# Patient Record
Sex: Female | Born: 1950 | Race: White | Hispanic: No | Smoking: Never smoker
Health system: Southern US, Community
[De-identification: ages and names within clinical notes are randomized; demographics above are authoritative.]

## PROBLEM LIST (undated history)

## (undated) DIAGNOSIS — C541 Malignant neoplasm of endometrium: Secondary | ICD-10-CM

## (undated) DIAGNOSIS — H269 Unspecified cataract: Secondary | ICD-10-CM

## (undated) DIAGNOSIS — M1990 Unspecified osteoarthritis, unspecified site: Secondary | ICD-10-CM

## (undated) DIAGNOSIS — T7840XA Allergy, unspecified, initial encounter: Secondary | ICD-10-CM

## (undated) DIAGNOSIS — G809 Cerebral palsy, unspecified: Secondary | ICD-10-CM

## (undated) DIAGNOSIS — E785 Hyperlipidemia, unspecified: Secondary | ICD-10-CM

## (undated) DIAGNOSIS — IMO0001 Reserved for inherently not codable concepts without codable children: Secondary | ICD-10-CM

## (undated) DIAGNOSIS — G5760 Lesion of plantar nerve, unspecified lower limb: Secondary | ICD-10-CM

## (undated) DIAGNOSIS — G479 Sleep disorder, unspecified: Secondary | ICD-10-CM

## (undated) DIAGNOSIS — M8580 Other specified disorders of bone density and structure, unspecified site: Secondary | ICD-10-CM

## (undated) DIAGNOSIS — IMO0002 Reserved for concepts with insufficient information to code with codable children: Secondary | ICD-10-CM

## (undated) DIAGNOSIS — K635 Polyp of colon: Secondary | ICD-10-CM

## (undated) DIAGNOSIS — J302 Other seasonal allergic rhinitis: Secondary | ICD-10-CM

## (undated) HISTORY — PX: TONSILLECTOMY: SUR1361

## (undated) HISTORY — PX: DILATION AND CURETTAGE OF UTERUS: SHX78

## (undated) HISTORY — DX: Reserved for inherently not codable concepts without codable children: IMO0001

## (undated) HISTORY — DX: Other specified disorders of bone density and structure, unspecified site: M85.80

## (undated) HISTORY — DX: Malignant neoplasm of endometrium: C54.1

## (undated) HISTORY — PX: OTHER SURGICAL HISTORY: SHX169

## (undated) HISTORY — DX: Polyp of colon: K63.5

## (undated) HISTORY — DX: Unspecified cataract: H26.9

## (undated) HISTORY — DX: Lesion of plantar nerve, unspecified lower limb: G57.60

## (undated) HISTORY — PX: TUBAL LIGATION: SHX77

## (undated) HISTORY — DX: Reserved for concepts with insufficient information to code with codable children: IMO0002

## (undated) HISTORY — PX: COLONOSCOPY: SHX174

## (undated) HISTORY — DX: Hyperlipidemia, unspecified: E78.5

## (undated) HISTORY — DX: Allergy, unspecified, initial encounter: T78.40XA

## (undated) HISTORY — DX: Unspecified osteoarthritis, unspecified site: M19.90

## (undated) HISTORY — DX: Other seasonal allergic rhinitis: J30.2

---

## 1999-07-12 ENCOUNTER — Encounter: Admission: RE | Admit: 1999-07-12 | Discharge: 1999-07-12 | Payer: Self-pay | Admitting: Obstetrics and Gynecology

## 1999-07-12 ENCOUNTER — Encounter: Payer: Self-pay | Admitting: Obstetrics and Gynecology

## 2000-10-02 ENCOUNTER — Encounter: Payer: Self-pay | Admitting: Obstetrics and Gynecology

## 2000-10-02 ENCOUNTER — Encounter: Admission: RE | Admit: 2000-10-02 | Discharge: 2000-10-02 | Payer: Self-pay | Admitting: Obstetrics and Gynecology

## 2002-03-04 ENCOUNTER — Encounter: Payer: Self-pay | Admitting: Obstetrics and Gynecology

## 2002-03-04 ENCOUNTER — Encounter: Admission: RE | Admit: 2002-03-04 | Discharge: 2002-03-04 | Payer: Self-pay | Admitting: Obstetrics and Gynecology

## 2002-03-25 DIAGNOSIS — K635 Polyp of colon: Secondary | ICD-10-CM

## 2002-03-25 HISTORY — DX: Polyp of colon: K63.5

## 2004-12-17 ENCOUNTER — Encounter: Admission: RE | Admit: 2004-12-17 | Discharge: 2004-12-17 | Payer: Self-pay | Admitting: Obstetrics and Gynecology

## 2005-08-15 ENCOUNTER — Encounter: Admission: RE | Admit: 2005-08-15 | Discharge: 2005-08-15 | Payer: Self-pay | Admitting: Family Medicine

## 2006-02-11 ENCOUNTER — Encounter: Admission: RE | Admit: 2006-02-11 | Discharge: 2006-02-11 | Payer: Self-pay | Admitting: Family Medicine

## 2007-05-14 ENCOUNTER — Encounter: Admission: RE | Admit: 2007-05-14 | Discharge: 2007-05-14 | Payer: Self-pay | Admitting: Family Medicine

## 2007-09-17 ENCOUNTER — Encounter: Admission: RE | Admit: 2007-09-17 | Discharge: 2007-09-17 | Payer: Self-pay | Admitting: Family Medicine

## 2010-03-25 DIAGNOSIS — G5760 Lesion of plantar nerve, unspecified lower limb: Secondary | ICD-10-CM

## 2010-03-25 HISTORY — DX: Lesion of plantar nerve, unspecified lower limb: G57.60

## 2011-02-04 ENCOUNTER — Other Ambulatory Visit: Payer: Self-pay | Admitting: Obstetrics and Gynecology

## 2011-02-04 DIAGNOSIS — Z1231 Encounter for screening mammogram for malignant neoplasm of breast: Secondary | ICD-10-CM

## 2011-02-05 ENCOUNTER — Encounter: Payer: Self-pay | Admitting: *Deleted

## 2011-02-05 ENCOUNTER — Ambulatory Visit (HOSPITAL_COMMUNITY)
Admission: RE | Admit: 2011-02-05 | Discharge: 2011-02-05 | Disposition: A | Discharge status: Home / Self Care | Payer: Self-pay | Source: Ambulatory Visit | Attending: Obstetrics and Gynecology | Admitting: Obstetrics and Gynecology

## 2011-02-05 ENCOUNTER — Ambulatory Visit (INDEPENDENT_AMBULATORY_CARE_PROVIDER_SITE_OTHER): Payer: Self-pay | Admitting: *Deleted

## 2011-02-05 VITALS — BP 139/88 | HR 70 | Temp 96.9°F | Ht 59.25 in | Wt 146.5 lb

## 2011-02-05 DIAGNOSIS — Z01419 Encounter for gynecological examination (general) (routine) without abnormal findings: Secondary | ICD-10-CM

## 2011-02-05 DIAGNOSIS — Z1231 Encounter for screening mammogram for malignant neoplasm of breast: Secondary | ICD-10-CM

## 2011-02-05 NOTE — Patient Instructions (Signed)
Taught patient how to perform BSE and gave educational materials to take home.Let her know [facility] will cover Pap smears every 3 years unless has a history of abnormal Pap smears. Told patient about free Pap smear screenings if would like a Pap smear at 1-2 years. Patient escorted to mammography to get her screening mammogram. Let patient know will follow up with her within the next couple weeks with results by either letter or phone. Patient verbalized understanding.

## 2011-02-05 NOTE — Progress Notes (Signed)
No complaints today.  Pap Smear:    Completed Pap smear today. Last Pap smear was at least 4 years ago per patient. Per patient had an abnormal Pap smear around 10-15 years ago. No Pap smear results in EPIC.  Physical exam: Breasts Breasts symmetrical. No skin abnormalities bilateral breasts. No nipple retraction bilateral breasts. No nipple discharge bilateral breasts. No lymphadenopathy. No lumps palpated bilateral breasts.          Pelvic/Bimanual   Ext Genitalia No lesions, no swelling and no discharge observed on external genitalia.         Vagina Vagina pink and normal texture. No lesions or discharge observed in vagina.          Cervix Cervix is present. Cervix pink and of normal texture. Cervix friable. Small amount of mucous like discharge from cervical opening.          Uterus Uterus is present and palpable. Uterus in normal position and normal size.       Adnexae Bilateral ovaries present and palpable. No tenderness on palpation.        Rectovaginal No rectal exam completed today since patient had no rectal complaints. Small hemorrhoid observed on rectal area.

## 2011-02-25 ENCOUNTER — Encounter: Payer: Self-pay | Admitting: Obstetrics and Gynecology

## 2012-01-29 ENCOUNTER — Other Ambulatory Visit (HOSPITAL_COMMUNITY): Payer: Self-pay | Admitting: Family Medicine

## 2012-02-06 ENCOUNTER — Encounter (HOSPITAL_COMMUNITY): Payer: Self-pay | Admitting: *Deleted

## 2012-02-18 ENCOUNTER — Ambulatory Visit (HOSPITAL_COMMUNITY): Payer: Self-pay

## 2012-03-04 ENCOUNTER — Other Ambulatory Visit: Payer: Self-pay | Admitting: Obstetrics and Gynecology

## 2012-03-04 DIAGNOSIS — Z1231 Encounter for screening mammogram for malignant neoplasm of breast: Secondary | ICD-10-CM

## 2012-03-31 ENCOUNTER — Ambulatory Visit (HOSPITAL_COMMUNITY): Payer: Self-pay

## 2012-03-31 ENCOUNTER — Ambulatory Visit (HOSPITAL_COMMUNITY): Payer: Self-pay | Attending: Obstetrics and Gynecology

## 2012-07-06 ENCOUNTER — Encounter: Payer: Self-pay | Admitting: Family Medicine

## 2012-07-06 ENCOUNTER — Ambulatory Visit (INDEPENDENT_AMBULATORY_CARE_PROVIDER_SITE_OTHER): Payer: BC Managed Care – PPO | Admitting: Family Medicine

## 2012-07-06 VITALS — BP 120/70 | HR 66 | Temp 97.4°F | Ht 59.5 in | Wt 145.2 lb

## 2012-07-06 DIAGNOSIS — M159 Polyosteoarthritis, unspecified: Secondary | ICD-10-CM | POA: Insufficient documentation

## 2012-07-06 DIAGNOSIS — J309 Allergic rhinitis, unspecified: Secondary | ICD-10-CM

## 2012-07-06 DIAGNOSIS — J302 Other seasonal allergic rhinitis: Secondary | ICD-10-CM | POA: Insufficient documentation

## 2012-07-06 MED ORDER — MELOXICAM 15 MG PO TABS: 15.0000 mg | ORAL_TABLET | Freq: Every day | ORAL | Status: DC

## 2012-07-06 NOTE — Assessment & Plan Note (Signed)
Stable on meloxicam.  RFd this med today.

## 2012-07-06 NOTE — Assessment & Plan Note (Signed)
Buy OTC generic allegra 180mg and take one tab once daily. If not helping much in 1 wk then buy OTC nasal spray called Nasacort and use 2 sprays in each nostril once daily 

## 2012-07-06 NOTE — Patient Instructions (Addendum)
Buy OTC generic allegra 180mg  and take one tab once daily. If not helping much in 1 wk then buy OTC nasal spray called Nasacort and use 2 sprays in each nostril once daily

## 2012-07-06 NOTE — Progress Notes (Signed)
Office Note 07/11/2012  CC:  Chief Complaint  Patient presents with  . Establish Care    HPI:  Kelsey Bruce is a 62 y.o. White female who is here to establish care. Patient's most recent primary MD: Dewain Penning in [place]. Old records were not reviewed prior to or during today's visit.  All rhin sx's: sneezing, runny nose, eyes water, feels PND and occ cough assoc with this.  Has tried claritin and zyrtec w/out much help. Regarding her arthritis, she says that things are relatively well controlled on daily meloxicam.  Past Medical History  Diagnosis Date  . Arthritis     LB, HIPs, knees, hands  . Seasonal allergic rhinitis   . Morton's neuroma 2012    Dr. Olevia Perches did injections in the past  . BPPV (benign paroxysmal positional vertigo)     Past Surgical History  Procedure Laterality Date  . Dilation and curettage of uterus    . Tonsillectomy      Family History  Problem Relation Age of Onset  . Heart disease Father   . Hypertension Father   . Cancer Father     lung  . Arthritis Father   . Alcohol abuse Father   . Heart disease Mother   . Cancer Mother     lung  . Deep vein thrombosis Mother   . Alcohol abuse Mother   . Heart disease Brother   . Hypertension Brother   . Heart disease Brother   . Hypertension Brother   . Heart disease Brother   . Hypertension Brother     History   Social History  . Marital Status: Divorced    Spouse Name: N/A    Number of Children: N/A  . Years of Education: N/A   Occupational History  . Not on file.   Social History Main Topics  . Smoking status: Never Smoker   . Smokeless tobacco: Never Used  . Alcohol Use: Yes  . Drug Use: No  . Sexually Active: No   Other Topics Concern  . Not on file   Social History Narrative   Divorced.  Two children.   Orig from [place], [place].   HS education.   Works at [employer] in [place].   No tobacco, occ alcohol, no drugs.   Has 3 brothers and they all smoked, as  did her parents.    Outpatient Encounter Prescriptions as of 07/06/2012  Medication Sig Dispense Refill  . gabapentin (NEURONTIN) 300 MG capsule Take 300 mg by mouth as needed.      . meloxicam (MOBIC) 15 MG tablet Take 1 tablet (15 mg total) by mouth daily.  30 tablet  6  . [DISCONTINUED] meloxicam (MOBIC) 15 MG tablet Take 15 mg by mouth daily.       No facility-administered encounter medications on file as of 07/06/2012.    No Known Allergies  ROS Review of Systems  Constitutional: Negative for fever and fatigue.  HENT: Negative for congestion and sore throat.   Eyes: Negative for visual disturbance.  Respiratory: Negative for cough.   Cardiovascular: Negative for chest pain.  Gastrointestinal: Negative for nausea and abdominal pain.  Genitourinary: Negative for dysuria.  Musculoskeletal: Negative for back pain and joint swelling.  Skin: Negative for rash.  Neurological: Negative for weakness and headaches.  Hematological: Negative for adenopathy.  Psychiatric/Behavioral: Negative for dysphoric mood.    PE; Blood pressure 120/70, pulse 66, temperature 97.4 F (36.3 C), temperature source Oral, height 4' 11.5" (1.511 m),  weight 145 lb 4 oz (65.885 kg), SpO2 95.00%. Gen: Alert, well appearing.  Patient is oriented to person, place, time, and situation. ENT: Ears: EACs clear, normal epithelium.  TMs with good light reflex and landmarks bilaterally.  Eyes: no injection, icteris, swelling, or exudate.  EOMI, PERRLA. Nose: no drainage or turbinate edema/swelling.  No injection or focal lesion.  Mouth: lips without lesion/swelling.  Oral mucosa pink and moist.  Dentition intact and without obvious caries or gingival swelling.  Oropharynx without erythema, exudate, or swelling.  Neck - No masses or thyromegaly or limitation in range of motion CV: RRR, no m/r/g.   LUNGS: CTA bilat, nonlabored resps, good aeration in all lung fields.  Pertinent labs:  None today  ASSESSMENT AND  PLAN:   New Pt; pt says no old records to obtain.  Osteoarthritis of multiple joints Stable on meloxicam.  RFd this med today.  Seasonal allergic rhinitis Buy OTC generic allegra 180mg  and take one tab once daily. If not helping much in 1 wk then buy OTC nasal spray called Nasacort and use 2 sprays in each nostril once daily    An After Visit Summary was printed and given to the patient.  Return for f/u at pt's convenience for fasting CPE with blood work. Need to clarify GYN screening/mammo status at that time.

## 2012-07-29 ENCOUNTER — Other Ambulatory Visit (INDEPENDENT_AMBULATORY_CARE_PROVIDER_SITE_OTHER): Payer: BC Managed Care – PPO

## 2012-07-29 DIAGNOSIS — Z0000 Encounter for general adult medical examination without abnormal findings: Secondary | ICD-10-CM

## 2012-07-29 LAB — HEPATIC FUNCTION PANEL
ALT: 19 U/L (ref 0–35)
AST: 19 U/L (ref 0–37)
Total Protein: 6.9 g/dL (ref 6.0–8.3)

## 2012-07-29 LAB — CBC
HCT: 41.7 % (ref 36.0–46.0)
MCV: 89.3 fl (ref 78.0–100.0)
Platelets: 254 10*3/uL (ref 150.0–400.0)
RBC: 4.67 Mil/uL (ref 3.87–5.11)
WBC: 6.1 10*3/uL (ref 4.5–10.5)

## 2012-07-29 LAB — LIPID PANEL
Cholesterol: 182 mg/dL (ref 0–200)
LDL Cholesterol: 121 mg/dL — ABNORMAL HIGH (ref 0–99)
Total CHOL/HDL Ratio: 4
Triglycerides: 83 mg/dL (ref 0.0–149.0)

## 2012-07-29 LAB — RENAL FUNCTION PANEL
Creatinine, Ser: 0.8 mg/dL (ref 0.4–1.2)
Glucose, Bld: 91 mg/dL (ref 70–99)
Phosphorus: 2.9 mg/dL (ref 2.3–4.6)
Potassium: 3.9 mEq/L (ref 3.5–5.1)
Sodium: 140 mEq/L (ref 135–145)

## 2012-07-29 NOTE — Progress Notes (Signed)
Labs only

## 2012-08-05 ENCOUNTER — Encounter: Payer: Self-pay | Admitting: Family Medicine

## 2012-08-05 ENCOUNTER — Telehealth: Payer: Self-pay | Admitting: Family Medicine

## 2012-08-05 ENCOUNTER — Ambulatory Visit (INDEPENDENT_AMBULATORY_CARE_PROVIDER_SITE_OTHER): Payer: BC Managed Care – PPO | Admitting: Family Medicine

## 2012-08-05 VITALS — BP 132/82 | HR 70 | Temp 97.6°F | Resp 14 | Ht 60.0 in | Wt 143.2 lb

## 2012-08-05 DIAGNOSIS — Z0000 Encounter for general adult medical examination without abnormal findings: Secondary | ICD-10-CM | POA: Insufficient documentation

## 2012-08-05 DIAGNOSIS — Z1239 Encounter for other screening for malignant neoplasm of breast: Secondary | ICD-10-CM

## 2012-08-05 DIAGNOSIS — G809 Cerebral palsy, unspecified: Secondary | ICD-10-CM | POA: Insufficient documentation

## 2012-08-05 NOTE — Patient Instructions (Addendum)

## 2012-08-05 NOTE — Telephone Encounter (Signed)
Pls call pt and tell her that you called [facility] to clarify date of last colonoscopy and it was 2004 and she was sent recall notice in 2009.  Please encourage her to contact [facility] (Dr. Marina Goodell) to arrange colonoscopy.--thx

## 2012-08-05 NOTE — Progress Notes (Signed)
Office Note 08/05/2012  CC:  Chief Complaint  Patient presents with  . Annual Exam    HPI:  Kelsey Bruce is a 62 y.o. White female who is here annual health maintenance exam. Reviewed recent labs today: all normal. Reports some frustration with her employer [employer]), who says her pace of work is slow and they accuse her of not trying hard enough.  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  There has been no progression of sx's per pt.  She is tearful today when talking about this b/c apparently she may lose her job at any time.  She said her level of anxiety at this time is not in need of medication, though.  Past Medical History  Diagnosis Date  . Arthritis     LB, HIPs, knees, hands  . Seasonal allergic rhinitis   . Morton's neuroma 2012    Dr. Olevia Perches did injections in the past  . BPPV (benign paroxysmal positional vertigo)     Past Surgical History  Procedure Laterality Date  . Dilation and curettage of uterus    . Tonsillectomy    . Colonoscopy  approx 2007    Normal per pt    Family History  Problem Relation Age of Onset  . Heart disease Father   . Hypertension Father   . Cancer Father     lung  . Arthritis Father   . Alcohol abuse Father   . Heart disease Mother   . Cancer Mother     lung  . Deep vein thrombosis Mother   . Alcohol abuse Mother   . Heart disease Brother   . Hypertension Brother   . Heart disease Brother   . Hypertension Brother   . Heart disease Brother   . Hypertension Brother     History   Social History  . Marital Status: Divorced    Spouse Name: N/A    Number of Children: N/A  . Years of Education: N/A   Occupational History  . Not on file.   Social History Main Topics  . Smoking status: Never Smoker   . Smokeless tobacco: Never Used  . Alcohol Use: Yes  . Drug Use: No  .  Sexually Active: No   Other Topics Concern  . Not on file   Social History Narrative   Divorced.  Two children.   Orig from [place], [place].   HS education.   Works at [employer] in [place].   No tobacco, occ alcohol, no drugs.   Has 3 brothers and they all smoked, as did her parents.    Outpatient Prescriptions Prior to Visit  Medication Sig Dispense Refill  . gabapentin (NEURONTIN) 300 MG capsule Take 300 mg by mouth as needed.      . meloxicam (MOBIC) 15 MG tablet Take 1 tablet (15 mg total) by mouth daily.  30 tablet  6   No facility-administered medications prior to visit.    No Known Allergies  ROS Review of Systems  Constitutional: Negative for fever, chills, appetite change and fatigue.  HENT: Negative for ear pain, congestion, sore throat, neck stiffness and dental problem.   Eyes: Negative for discharge, redness and visual disturbance.  Respiratory: Negative for cough, chest tightness, shortness of breath and wheezing.   Cardiovascular: Negative for chest pain, palpitations and leg swelling.  Gastrointestinal: Negative for nausea, vomiting, abdominal pain, diarrhea and blood in stool.  Endocrine: Negative for cold intolerance, heat intolerance, polydipsia, polyphagia and polyuria.  Genitourinary: Negative for dysuria, urgency, frequency, hematuria, flank pain and difficulty urinating.  Musculoskeletal: Negative for myalgias, back pain, joint swelling and arthralgias.  Skin: Negative for pallor and rash.  Neurological: Negative for dizziness, tremors, seizures, syncope, speech difficulty, weakness, numbness and headaches.  Hematological: Negative for adenopathy. Does not bruise/bleed easily.  Psychiatric/Behavioral: Negative for confusion and sleep disturbance. The patient is not nervous/anxious.      PE; Blood pressure 132/82, pulse 70, temperature 97.6 F (36.4 C), temperature source Oral, resp. rate 14, height 5' (1.524 m), weight 143 lb 4 oz (64.978 kg), SpO2  98.00%. Gen: Alert, well appearing.  Patient is oriented to person, place, time, and situation. AFFECT: pleasant, lucid thought and speech. ENT: Ears: EACs clear, normal epithelium.  TMs with good light reflex and landmarks bilaterally.  Eyes: no injection, icteris, swelling, or exudate.  EOMI, PERRLA. Nose: no drainage or turbinate edema/swelling.  No injection or focal lesion.  Mouth: lips without lesion/swelling.  Oral mucosa pink and moist.  Dentition intact and without obvious caries or gingival swelling.  Oropharynx without erythema, exudate, or swelling.  Neck: supple/nontender.  No LAD, mass, or TM.  Carotid pulses 2+ bilaterally, without bruits. CV: RRR, no m/r/g.   LUNGS: CTA bilat, nonlabored resps, good aeration in all lung fields. ABD: soft, NT, ND, BS normal.  No hepatospenomegaly or mass.  No bruits. EXT: no clubbing, cyanosis, or edema.  Musculoskeletal: no joint swelling, erythema, warmth, or tenderness.  ROM of all joints intact. Skin - no sores or suspicious lesions or rashes or color changes Breasts: breasts appear normal, no suspicious masses, no skin or nipple changes or axillary nodes. Neuro: CN 2-12 intact bilaterally, strength 5/5 in proximal and distal upper extremities.  Lower extremies strength 5/5 hip on right, 5-/5 on left hip, 5-/5 bilat in quads.  Ankles/calfs 5/5 bilaterally.  No sensory deficits.  No tremor.  Very mild intention tremor with FNF bilat.  Heel-chin-ankle bilat was normal.  No ataxia.  Upper extremity and lower extremity DTRs symmetric.  Babinski --no reaction bilat.  No pronator drift.  Pertinent labs:  Lab Results  Component Value Date   TSH 1.62 07/29/2012   Lab Results  Component Value Date   WBC 6.1 07/29/2012   HGB 14.1 07/29/2012   HCT 41.7 07/29/2012   MCV 89.3 07/29/2012   PLT 254.0 07/29/2012   Lab Results  Component Value Date   CREATININE 0.8 07/29/2012   BUN 27* 07/29/2012   NA 140 07/29/2012   K 3.9 07/29/2012   CL 107 07/29/2012   CO2 28  07/29/2012   Lab Results  Component Value Date   ALT 19 07/29/2012   AST 19 07/29/2012   ALKPHOS 54 07/29/2012   BILITOT 0.8 07/29/2012   Lab Results  Component Value Date   CHOL 182 07/29/2012   Lab Results  Component Value Date   HDL 44.60 07/29/2012   Lab Results  Component Value Date   LDLCALC 121* 07/29/2012   Lab Results  Component Value Date   TRIG 83.0 07/29/2012   Lab Results  Component Value Date   CHOLHDL 4 07/29/2012   No results found for this basename: PSA    ASSESSMENT AND PLAN:   Health maintenance examination Reviewed age and gender appropriate health maintenance issues (prudent diet, regular exercise, health risks of tobacco and excessive alcohol,  use of seatbelts, fire alarms in home, use of sunscreen).  Also reviewed age and gender appropriate health screening as well as vaccine recommendations. Mammogram ordered today. Next colonoscopy 3 yrs according to date of last colonoscopy given by pt--will try to get record to verify this. Reviewed all screening labs done recently and these were normal Encouraged daily vit D and calcium supplement. Next pap/pelvic due approx 01/2014, but for convenience we'll go ahead and do this at her next annual CPE in 1 yr.  Cerebral palsy Very mild, but apparently her employer is concerned that it is slowing her pace of work down to a concerning level.  Patient disagrees, says she is no different than previous years and has never had employers voice concern. She feels very anxious about this but at this time says there is nothing I or anyone else can do to help her.  I told her to call back if a letter to her employer or a referral to a neurologist would help.   An After Visit Summary was printed and given to the patient.  FOLLOW UP:  Return in about 1 year (around 08/05/2013) for CPE with pap/pelvic.  ADDENDUM: after talking with [facility]: pt had colonoscopy and polypectomy 2004 by Dr. Marina Goodell and was sent a recall notice in 2009, which  she did not respond to.  Will notifiy pt of need for her to get f/u colonoscopy.   --PM

## 2012-08-05 NOTE — Assessment & Plan Note (Signed)
Very mild, but apparently her employer is concerned that it is slowing her pace of work down to a concerning level.  Patient disagrees, says she is no different than previous years and has never had employers voice concern. She feels very anxious about this but at this time says there is nothing I or anyone else can do to help her.  I told her to call back if a letter to her employer or a referral to a neurologist would help.

## 2012-08-05 NOTE — Assessment & Plan Note (Signed)
Reviewed age and gender appropriate health maintenance issues (prudent diet, regular exercise, health risks of tobacco and excessive alcohol, use of seatbelts, fire alarms in home, use of sunscreen).  Also reviewed age and gender appropriate health screening as well as vaccine recommendations. Mammogram ordered today. Next colonoscopy 3 yrs according to date of last colonoscopy given by pt--will try to get record to verify this. Reviewed all screening labs done recently and these were normal Encouraged daily vit D and calcium supplement. Next pap/pelvic due approx 01/2014, but for convenience we'll go ahead and do this at her next annual CPE in 1 yr.

## 2012-08-06 NOTE — Telephone Encounter (Signed)
LMOM with contact name and number for return call RE: Colonoscopy history/SLS

## 2012-08-07 NOTE — Telephone Encounter (Signed)
LMOM [2nd] with contact name and number for return call RE: colonoscopy and further provider instructions/SLS

## 2012-08-14 ENCOUNTER — Encounter: Payer: Self-pay | Admitting: *Deleted

## 2012-08-14 NOTE — Telephone Encounter (Signed)
Letter Mailed; provider informed/SLS

## 2012-09-10 ENCOUNTER — Ambulatory Visit
Admission: RE | Admit: 2012-09-10 | Discharge: 2012-09-10 | Disposition: A | Discharge status: Home / Self Care | Payer: BC Managed Care – PPO | Source: Ambulatory Visit | Attending: Family Medicine | Admitting: Family Medicine

## 2012-09-10 DIAGNOSIS — Z1239 Encounter for other screening for malignant neoplasm of breast: Secondary | ICD-10-CM

## 2012-09-17 ENCOUNTER — Telehealth: Payer: Self-pay | Admitting: *Deleted

## 2012-09-17 NOTE — Telephone Encounter (Signed)
Patient left voice mail requesting someone call with her mammogram results. Left message for patient letting her know that we will call her when results are released.

## 2012-09-22 ENCOUNTER — Telehealth: Payer: Self-pay | Admitting: Family Medicine

## 2012-09-22 NOTE — Telephone Encounter (Signed)
The disability process starts with the state social services department, so she should contact them and tell them her needs.  --thx

## 2012-09-22 NOTE — Telephone Encounter (Signed)
Pt wants to see if she can get disability.  Pt states she lost her job because she couldn't perform her work duties.  Pt states her feet and legs hurt so bad she has to use the wall to support her especially in the mornings.  Pt states that they just constantly hurt.  Patient states she does see a foot doctor.  Pt just wanted to see if she could start disability process.

## 2012-09-23 NOTE — Telephone Encounter (Signed)
Patient aware.

## 2012-10-26 ENCOUNTER — Ambulatory Visit (INDEPENDENT_AMBULATORY_CARE_PROVIDER_SITE_OTHER): Payer: BC Managed Care – PPO | Admitting: Family Medicine

## 2012-10-26 ENCOUNTER — Ambulatory Visit (INDEPENDENT_AMBULATORY_CARE_PROVIDER_SITE_OTHER)
Admission: RE | Admit: 2012-10-26 | Discharge: 2012-10-26 | Disposition: A | Discharge status: Home / Self Care | Payer: BC Managed Care – PPO | Source: Ambulatory Visit | Attending: Family Medicine | Admitting: Family Medicine

## 2012-10-26 ENCOUNTER — Telehealth: Payer: Self-pay | Admitting: Family Medicine

## 2012-10-26 ENCOUNTER — Encounter: Payer: Self-pay | Admitting: Family Medicine

## 2012-10-26 VITALS — BP 134/81 | HR 67 | Temp 98.0°F | Resp 16 | Ht 60.0 in | Wt 145.0 lb

## 2012-10-26 DIAGNOSIS — M25572 Pain in left ankle and joints of left foot: Secondary | ICD-10-CM

## 2012-10-26 DIAGNOSIS — M25579 Pain in unspecified ankle and joints of unspecified foot: Secondary | ICD-10-CM | POA: Insufficient documentation

## 2012-10-26 LAB — URIC ACID: Uric Acid, Serum: 5.6 mg/dL (ref 2.4–7.0)

## 2012-10-26 NOTE — Progress Notes (Signed)
OFFICE NOTE  10/26/2012  CC:  Chief Complaint  Patient presents with  . Foot Pain    Left foot x 2 weeks     HPI: Patient is a 62 y.o. Caucasian female who is here for left foot pain.   Onset months ago, getting worse x 2 weeks.  Achy pain in left side and top of foot.  Occ feels a knot-like swelling laterally and feels like is swells behind lat malleolus.  By the end of the day her calf area hurts. Has hx of Mortons neuroma on left foot, had injection in past and hurts occ in that area.  Her podiatrist is Dr. Ralene Cork.  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    No joints have been swollen or red.  No rashes.  No fevers/malaise.  Minimal plantar foot pain--mainly lateral region.  Pertinent PMH:  Past Medical History  Diagnosis Date  . Arthritis     LB, HIPs, knees, hands  . Seasonal allergic rhinitis   . Morton's neuroma 2012    Dr. Olevia Perches did injections in the past  . BPPV (benign paroxysmal positional vertigo)    Past Surgical History  Procedure Laterality Date  . Dilation and curettage of uterus    . Tonsillectomy    . Colonoscopy  2004 (Dr. Marina Goodell)    poly; recall sent 2009 but pt apparently didn't respond.   Past family and social history reviewed and there are no changes since the patient's last office visit with me.  MEDS:  Outpatient Prescriptions Prior to Visit  Medication Sig Dispense Refill  . beta carotene w/minerals (OCUVITE) tablet Take 1 tablet by mouth daily.      Marland Kitchen gabapentin (NEURONTIN) 300 MG capsule Take 300 mg by mouth as needed.      . meloxicam (MOBIC) 15 MG tablet Take 1 tablet (15 mg total) by mouth daily.  30 tablet  6  . Multiple Vitamin (MULTIVITAMIN) tablet Take 1 tablet by mouth daily.       No facility-administered medications prior to visit.    PE: Blood pressure 134/81, pulse 67, temperature 98 F (36.7 C), temperature source Temporal, resp. rate 16, height 5' (1.524 m), weight 145 lb (65.772 kg), SpO2 95.00%. Gen:  Alert, well appearing.  Patient is oriented to person, place, time, and situation. Calves and ankles symmetric: no swelling, no warmth, no erythema.   Left ankle with full ROM, normal sensation and temperature, with mild tenderness diffusely in area posterior to lat mall. Dorsolateral surface of left foot with mild warmth compared to remainder of foot but no erythema and only a mild amount of discomfort with palpation.  Mild TTP in web space between 2nd and 3rd toes, no mass palpable. Pes planus bilat.  IMPRESSION AND PLAN:  Arthralgia of foot Suspect osteoarthritis, possibly musculoskeletal overuse-type pain from pes planus and inadequate supportive footwear. Doubt gouty arthritis. Will check x-ray here today and uric acid level. 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 She'll continue daily meloxicam 15mg  --try to take more consistently.    An After Visit Summary was printed and given to the patient.  FOLLOW UP: prn

## 2012-10-26 NOTE — Telephone Encounter (Signed)
Patient Information:  Caller Name: Kelsey Bruce  Phone: [phone]  Patient: Kelsey Bruce, Kelsey Bruce  Gender: Female  DOB: 1950/11/05  Age: 62 Years  PCP: Earley Favor [facility])  Office Follow Up:  Does the office need to follow up with this patient?: No  Instructions For The Office: N/A  RN Note:  Spoke with scheduling and she can be seen in office. Appointment scheduled for 0900-10/26/12  Symptoms  Reason For Call & Symptoms: Calling about soreness and swelling on outer side and front of L foot. Hx of Nuroma and steroid shots in foot. Foot and ankle are red,  swollen and feels warm at the end of the day, and calf is achy. Swelling goes down over night. Hurts worse with walking. Afebrile.  Reviewed Health History In EMR: Yes  Reviewed Medications In EMR: Yes  Reviewed Allergies In EMR: Yes  Reviewed Surgeries / Procedures: Yes  Date of Onset of Symptoms: 10/13/2012  Treatments Tried: Heating Pad and elevation, Alleve  Treatments Tried Worked: No  Guideline(s) Used:  Leg Swelling and Edema  Disposition Per Guideline:   Go to ED Now (or to Office with PCP Approval)  Reason For Disposition Reached:   Thigh or calf pain and only 1 side and present > 1 hour  Advice Given:  Expected Course:  If your leg swelling does not get better during the next week or if it recurs, make an appointment with your doctor.  Call Back If:  Swelling becomes worse  Swelling becomes red or painful to the touch  Calf pain occurs and becomes constant  You become worse.  Patient Will Follow Care Advice:  YES  Appointment Scheduled:  10/26/2012 09:00:00 Appointment Scheduled Provider:  Earley Favor [facility])

## 2012-10-26 NOTE — Assessment & Plan Note (Addendum)
Suspect osteoarthritis, possibly musculoskeletal overuse-type pain from pes planus and inadequate supportive footwear. Doubt gouty arthritis. Will check x-ray here today and uric acid level. 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 She'll continue daily meloxicam 15mg  --try to take more consistently.

## 2012-10-27 ENCOUNTER — Other Ambulatory Visit: Payer: Self-pay | Admitting: Family Medicine

## 2012-10-27 MED ORDER — GABAPENTIN 300 MG PO CAPS: 300.0000 mg | ORAL_CAPSULE | ORAL | Status: DC | PRN

## 2012-10-27 MED ORDER — HYDROCODONE-ACETAMINOPHEN 5-325 MG PO TABS: ORAL_TABLET | ORAL | Status: DC

## 2013-01-28 ENCOUNTER — Other Ambulatory Visit: Payer: Self-pay

## 2013-04-21 ENCOUNTER — Telehealth: Payer: Self-pay | Admitting: Family Medicine

## 2013-04-21 ENCOUNTER — Encounter: Payer: Self-pay | Admitting: Family Medicine

## 2013-04-21 ENCOUNTER — Ambulatory Visit (INDEPENDENT_AMBULATORY_CARE_PROVIDER_SITE_OTHER): Payer: BC Managed Care – PPO | Admitting: Family Medicine

## 2013-04-21 VITALS — BP 144/84 | HR 71 | Temp 98.6°F | Resp 18 | Ht 60.0 in | Wt 149.0 lb

## 2013-04-21 DIAGNOSIS — M67919 Unspecified disorder of synovium and tendon, unspecified shoulder: Secondary | ICD-10-CM

## 2013-04-21 DIAGNOSIS — J209 Acute bronchitis, unspecified: Secondary | ICD-10-CM

## 2013-04-21 DIAGNOSIS — M719 Bursopathy, unspecified: Secondary | ICD-10-CM

## 2013-04-21 DIAGNOSIS — J069 Acute upper respiratory infection, unspecified: Secondary | ICD-10-CM

## 2013-04-21 DIAGNOSIS — M7580 Other shoulder lesions, unspecified shoulder: Secondary | ICD-10-CM

## 2013-04-21 HISTORY — DX: Other shoulder lesions, unspecified shoulder: M75.80

## 2013-04-21 MED ORDER — METHYLPREDNISOLONE ACETATE 40 MG/ML IJ SUSP
40.0000 mg | Freq: Once | INTRAMUSCULAR | Status: AC
  Administered 2013-04-21: 40 mg via INTRA_ARTICULAR

## 2013-04-21 NOTE — Progress Notes (Signed)
Pre visit review using our clinic review tool, if applicable. No additional management support is needed unless otherwise documented below in the visit note. 

## 2013-04-21 NOTE — Telephone Encounter (Signed)
Patient Information:  Caller Name: Ryen  Phone: [phone]  Patient: Kelsey Bruce, Kelsey Bruce  Gender: Female  DOB: 14-Mar-1951  Age: 63 Years  PCP: Ricardo Jericho [facility])  Office Follow Up:  Does the office need to follow up with this patient?: No  Instructions For The Office: N/A   Symptoms  Reason For Call & Symptoms: Calling about L shoulder pain for past 4-6 mths- hurts to lift arm and reach in certain ways. Taking Meloxican 15 mgs daily and not helping.  Started with dry cough a few weeks ago and worse last night, no other sx. Takes OTC allergy med and Flutticanizole nose spray and helps some. Last night cough woke her up several times.  Reviewed Health History In EMR: Yes  Reviewed Medications In EMR: Yes  Reviewed Allergies In EMR: Yes  Reviewed Surgeries / Procedures: Yes  Date of Onset of Symptoms: 04/20/2013  Treatments Tried: Advil 2 tabs PO prn or  Alleve 1 tabs PO prn, cough drops  Treatments Tried Worked: No  Guideline(s) Used:  Cough  Disposition Per Guideline:   See Within 3 Days in Office  Reason For Disposition Reached:   Cough has been present for > 10 days  Advice Given:  Reassurance  Coughing is the way that our lungs remove irritants and mucus. It helps protect our lungs from getting pneumonia.  You can get a dry hacking cough after a chest cold. Sometimes this type of cough can last 1-3 weeks, and be worse at night.  You can also get a cough after being exposed to irritating substances like smoke, strong perfumes, and dust.  Cough Medicines:  Home Remedy - Honey: This old home remedy has been shown to help decrease coughing at night. The adult dosage is 2 teaspoons (10 ml) at bedtime. Honey should not be given to infants under one year of age.  Coughing Spasms:  Drink warm fluids. Inhale warm mist (Reason: both relax the airway and loosen up the phlegm).  Suck on cough drops or hard candy to coat the irritated throat.  Expected Course:   The  expected course depends on what is causing the cough.  Viral bronchitis (chest cold) causes a cough that lasts 1 to 3 weeks. Sometimes you may cough up lots of phlegm (sputum, mucus). The mucus can normally be white, gray, yellow, or green.  Call Back If:  Difficulty breathing  Cough lasts more than 3 weeks  Fever lasts > 3 days  You become worse.  Patient Will Follow Care Advice:  YES  Appointment Scheduled:  04/21/2013 11:15:00 Appointment Scheduled Provider:  Ricardo Jericho [facility])

## 2013-04-21 NOTE — Patient Instructions (Signed)
Generic OTC robitussin DM      OR Mucinex DM (OTC)

## 2013-04-21 NOTE — Telephone Encounter (Signed)
Just an FYI.   She has been put on our schedule today.

## 2013-04-21 NOTE — Assessment & Plan Note (Signed)
She has failed conservative/initial therapy. Discussed giving a steroid injection and she was agreeable to this.   Procedure: Therapeutic shoulder injection (left).  The patient's clinical condition is marked by substantial pain and/or significant functional disability.  Other conservative therapy has not provided relief, is contraindicated, or not appropriate.  There is a reasonable likelihood that injection will significantly improve the patient's pain and/or functional disability. Cleaned skin with alcohol swab, used posterolateral approach, Injected 1 ml of 40mg /ml depo-medrol + 2 ml of 1% lidocaine into subacromial space without resistance.  No immediate complications.  Patient tolerated procedure well.  Post-injection care discussed, including 20 min of icing 1-2 times in the next 4-8 hours, frequent non weight-bearing ROM exercises over the next few days, and general pain medication management.

## 2013-04-21 NOTE — Assessment & Plan Note (Signed)
Fairly prolonged but I am seeing no evidence for bacterial infection.   She likely has mild acute bronchitis as well (but no sign of RAD). Watchful waiting approach, with ongoing symptom management with robitussin DM. Signs/symptoms to call or return for were reviewed and pt expressed understanding.

## 2013-04-21 NOTE — Progress Notes (Signed)
OFFICE NOTE  04/21/2013  CC:  Chief Complaint  Patient presents with  . Cough    dry cough x off and on for a couples weeks  . Arthritis    would like mobic to bve increased     HPI: Patient is a 63 y.o. Caucasian female who is here for cough and also questions about her arthritis.  Left shoulder pain x 5-6 mo, worse with abduction/reaching.  Intermittent, mainly just with movements but occ throbbing w/out movement --when in bed at night.  No weakness or paresthesias in arm.  Some occ extension of the pain up into left side of neck and left upper trap/scap region.  No injury or overuse prior to onset of the pain. No hx of problems with this shoulder.  Onset of cough, scratchy throat, some nasal congestion/runny nose---about 10 d/a.  +some fatigue.  No SOB, chest tightness or wheezing.  Some pain in chest when she coughs.  Cough is dry. No fevers or SOB. No improvement.   Pertinent PMH:  Past Medical History  Diagnosis Date  . Arthritis     LB, HIPs, knees, hands  . Seasonal allergic rhinitis   . Morton's neuroma 2012    Dr. Roseanne Reno did injections in the past  . BPPV (benign paroxysmal positional vertigo)    Past surgical, social, and family history reviewed and no changes noted since last office visit.  MEDS:  Outpatient Prescriptions Prior to Visit  Medication Sig Dispense Refill  . beta carotene w/minerals (OCUVITE) tablet Take 1 tablet by mouth daily.      Marland Kitchen gabapentin (NEURONTIN) 300 MG capsule Take 1 capsule (300 mg total) by mouth as needed.  30 capsule  1  . HYDROcodone-acetaminophen (NORCO/VICODIN) 5-325 MG per tablet 1-2 tabs po q6h prn pain  30 tablet  0  . meloxicam (MOBIC) 15 MG tablet Take 1 tablet (15 mg total) by mouth daily.  30 tablet  6  . Multiple Vitamin (MULTIVITAMIN) tablet Take 1 tablet by mouth daily.       No facility-administered medications prior to visit.    PE: Blood pressure 144/84, pulse 71, temperature 98.6 F (37 C), temperature source  Temporal, resp. rate 18, height 5' (1.524 m), weight 149 lb (67.586 kg), SpO2 97.00%. VS: noted--normal. Gen: alert, NAD, NONTOXIC APPEARING. HEENT: eyes without injection, drainage, or swelling.  Ears: EACs clear, TMs with normal light reflex and landmarks.  Nose: Clear rhinorrhea, with some dried, crusty exudate adherent to mildly injected mucosa.  No purulent d/c.  No paranasal sinus TTP.  No facial swelling.  Throat and mouth without focal lesion.  No pharyngial swelling, erythema, or exudate.   Neck: supple, no LAD.   LUNGS: CTA bilat, nonlabored resps.   CV: RRR, no m/r/g. EXT: no c/c/e SKIN: no rash Left shoulder without significant tenderness to palpation. No deformity.  Pain at 100 deg abduction, pain with resisted abduction and resisted IR.  O'brien's test neg. Neg drop sign.  Arm strength 5/5 prox and dist. Neck with full ROM without tenderness.  Upper back without tenderness.  IMPRESSION AND PLAN:  URI (upper respiratory infection) Fairly prolonged but I am seeing no evidence for bacterial infection.   She likely has mild acute bronchitis as well (but no sign of RAD). Watchful waiting approach, with ongoing symptom management with robitussin DM. Signs/symptoms to call or return for were reviewed and pt expressed understanding.   Rotator cuff tendonitis She has failed conservative/initial therapy. Discussed giving a steroid injection and  she was agreeable to this.   Procedure: Therapeutic shoulder injection (left).  The patient's clinical condition is marked by substantial pain and/or significant functional disability.  Other conservative therapy has not provided relief, is contraindicated, or not appropriate.  There is a reasonable likelihood that injection will significantly improve the patient's pain and/or functional disability. Cleaned skin with alcohol swab, used posterolateral approach, Injected 1 ml of 40mg /ml depo-medrol + 2 ml of 1% lidocaine into subacromial space  without resistance.  No immediate complications.  Patient tolerated procedure well.  Post-injection care discussed, including 20 min of icing 1-2 times in the next 4-8 hours, frequent non weight-bearing ROM exercises over the next few days, and general pain medication management.    An After Visit Summary was printed and given to the patient.  FOLLOW UP: prn

## 2013-07-06 ENCOUNTER — Telehealth: Payer: Self-pay | Admitting: Family Medicine

## 2013-07-06 DIAGNOSIS — Z124 Encounter for screening for malignant neoplasm of cervix: Secondary | ICD-10-CM

## 2013-07-06 NOTE — Telephone Encounter (Signed)
Patient requesting referral for OBGYN.   Please advise.

## 2013-07-07 NOTE — Telephone Encounter (Signed)
GYN referral entered as per pt request.

## 2013-08-05 ENCOUNTER — Encounter: Payer: BC Managed Care – PPO | Admitting: Obstetrics & Gynecology

## 2013-08-10 ENCOUNTER — Other Ambulatory Visit: Payer: Self-pay | Admitting: Family Medicine

## 2013-08-11 ENCOUNTER — Encounter: Payer: Self-pay | Admitting: Obstetrics & Gynecology

## 2013-08-11 ENCOUNTER — Ambulatory Visit (INDEPENDENT_AMBULATORY_CARE_PROVIDER_SITE_OTHER): Payer: BC Managed Care – PPO | Admitting: Obstetrics & Gynecology

## 2013-08-11 VITALS — BP 141/86 | HR 88 | Temp 97.0°F | Resp 20 | Ht 60.0 in | Wt 149.7 lb

## 2013-08-11 DIAGNOSIS — N950 Postmenopausal bleeding: Secondary | ICD-10-CM

## 2013-08-11 MED ORDER — MISOPROSTOL 200 MCG PO TABS
ORAL_TABLET | ORAL | Status: DC
Start: 2013-08-11 — End: 2013-09-17

## 2013-08-11 NOTE — Progress Notes (Signed)
   Subjective:    Patient ID: Kelsey Bruce, female    DOB: 1950-04-06, 63 y.o.   MRN: [id number]  HPI  See nurse  Review of Systems     Objective:   Physical Exam        Assessment & Plan:  cytotec the night before her [facility]

## 2013-08-11 NOTE — Progress Notes (Signed)
Pt states she was referred for routine Gyn exam w/Pap.  She has been having vaginal bleeding (spotting) x3 months. She had not discussed this problem with her PCP.  I consulted with Dr. Hulan Fray and she recommended pt have Pelvic and Transvag US soon and then return to clinic for Annual exam w/pap and Endo Bx.  This plan of care was discussed with pt and she agreed.  [place] scheduled on 6/2. Pt will return to clinic as scheduled.

## 2013-08-11 NOTE — Telephone Encounter (Signed)
Refill request for maloxicam Last filled by MD on - 07/06/2012 #30 x6 Last Appt: 04/21/2013 Next Appt: none Please advise refill?

## 2013-08-23 ENCOUNTER — Telehealth: Payer: Self-pay | Admitting: *Deleted

## 2013-08-23 NOTE — Telephone Encounter (Signed)
Kelsey Bruce left a message stating she saw Dr. Hulan Fray the other week and she has a question for the nurse.  States she has an ultrasound tomorrow 08/24/13 and was told to take some medicine before her biospsy - not sure is she should take it tomorrow or when she sees the doctor again on 09/06/13.  Per chart review plan is endometrial biopsy 09/06/13 and pelvic ultrasound 08/24/13. Called Kelsey Bruce and informed her she should take the cytotec night before her 09/06/13 appt for endometrial biopsy. Answered all her questions.

## 2013-08-24 ENCOUNTER — Ambulatory Visit (HOSPITAL_COMMUNITY)
Admission: RE | Admit: 2013-08-24 | Discharge: 2013-08-24 | Disposition: A | Discharge status: Home / Self Care | Payer: BC Managed Care – PPO | Source: Ambulatory Visit | Attending: Obstetrics & Gynecology | Admitting: Obstetrics & Gynecology

## 2013-08-24 DIAGNOSIS — N950 Postmenopausal bleeding: Secondary | ICD-10-CM | POA: Insufficient documentation

## 2013-09-06 ENCOUNTER — Ambulatory Visit (INDEPENDENT_AMBULATORY_CARE_PROVIDER_SITE_OTHER): Payer: BC Managed Care – PPO | Admitting: Obstetrics & Gynecology

## 2013-09-06 ENCOUNTER — Other Ambulatory Visit (HOSPITAL_COMMUNITY)
Admission: RE | Admit: 2013-09-06 | Discharge: 2013-09-06 | Disposition: A | Discharge status: Home / Self Care | Payer: BC Managed Care – PPO | Source: Ambulatory Visit | Attending: Obstetrics & Gynecology | Admitting: Obstetrics & Gynecology

## 2013-09-06 ENCOUNTER — Encounter: Payer: Self-pay | Admitting: Obstetrics & Gynecology

## 2013-09-06 VITALS — BP 134/83 | HR 84 | Temp 96.8°F | Ht 60.0 in | Wt 149.9 lb

## 2013-09-06 DIAGNOSIS — Z0000 Encounter for general adult medical examination without abnormal findings: Secondary | ICD-10-CM

## 2013-09-06 DIAGNOSIS — C541 Malignant neoplasm of endometrium: Secondary | ICD-10-CM | POA: Insufficient documentation

## 2013-09-06 DIAGNOSIS — C549 Malignant neoplasm of corpus uteri, unspecified: Secondary | ICD-10-CM | POA: Insufficient documentation

## 2013-09-06 DIAGNOSIS — N950 Postmenopausal bleeding: Secondary | ICD-10-CM

## 2013-09-06 NOTE — Progress Notes (Signed)
Subjective:    Kelsey Bruce is a 63 y.o. female who presents for an annual exam. She is also here for a EMBX as she has had PMB for at least 3 months. An u/s done recently showed a 2.7 cm endometrium.  The patient is not currently sexually active. GYN screening history: last pap: was normal. The patient wears seatbelts: yes. The patient participates in regular exercise: yes. Has the patient ever been transfused or tattooed?: no. The patient reports that there is not domestic violence in her life.   Menstrual History: OB History   Grav Para Term Preterm Abortions TAB SAB Ect Mult Living   2 2 2       2       No LMP recorded. Patient is postmenopausal.    The following portions of the patient's history were reviewed and updated as appropriate: allergies, current medications, past family history, past medical history, past social history, past surgical history and problem list.  Review of Systems A comprehensive review of systems was negative.    Objective:    BP 134/83  Pulse 84  Temp(Src) 96.8 F (36 C) (Oral)  Ht 5' (1.524 m)  Wt 149 lb 14.4 oz (67.994 kg)  BMI 29.28 kg/m2  General Appearance:    Alert, cooperative, no distress, appears stated age  Head:    Normocephalic, without obvious abnormality, atraumatic  Eyes:    PERRL, conjunctiva/corneas clear, EOM's intact, fundi    benign, both eyes  Ears:    Normal TM's and external ear canals, both ears  Nose:   Nares normal, septum midline, mucosa normal, no drainage    or sinus tenderness  Throat:   Lips, mucosa, and tongue normal; teeth and gums normal  Neck:   Supple, symmetrical, trachea midline, no adenopathy;    thyroid:  no enlargement/tenderness/nodules; no carotid   bruit or JVD  Back:     Symmetric, no curvature, ROM normal, no CVA tenderness  Lungs:     Clear to auscultation bilaterally, respirations unlabored  Chest Wall:    No tenderness or deformity   Heart:    Regular rate and rhythm, S1 and S2 normal, no  murmur, rub   or gallop  Breast Exam:    No tenderness, masses, or nipple abnormality  Abdomen:     Soft, non-tender, bowel sounds active all four quadrants,    no masses, no organomegaly  Genitalia:    Normal female without lesion, discharge or tenderness, NSSA, NT, normal adnexal exam   UPT negative, consent signed, time out done Cervix prepped with betadine and grasped with a single tooth tenaculum Uterus sounded to 9 cm Pipelle used for 3 passes with a large amount of tissue obtained. She tolerated the procedure well.       Extremities:   Extremities normal, atraumatic, no cyanosis or edema  Pulses:   2+ and symmetric all extremities  Skin:   Skin color, texture, turgor normal, no rashes or lesions  Lymph nodes:   Cervical, supraclavicular, and axillary nodes normal  Neurologic:   CNII-XII intact, normal strength, sensation and reflexes    throughout  .    Assessment:    Healthy female exam.  PMB   Plan:     Breast self exam technique reviewed and patient encouraged to perform self-exam monthly. Mammogram. Thin prep Pap smear.  with cotesting RTC for [facility] results

## 2013-09-09 LAB — CYTOLOGY - PAP

## 2013-09-10 ENCOUNTER — Telehealth: Payer: Self-pay

## 2013-09-10 ENCOUNTER — Ambulatory Visit (INDEPENDENT_AMBULATORY_CARE_PROVIDER_SITE_OTHER): Payer: BC Managed Care – PPO | Admitting: Obstetrics & Gynecology

## 2013-09-10 DIAGNOSIS — C541 Malignant neoplasm of endometrium: Secondary | ICD-10-CM

## 2013-09-10 DIAGNOSIS — Z01812 Encounter for preprocedural laboratory examination: Secondary | ICD-10-CM

## 2013-09-10 DIAGNOSIS — C549 Malignant neoplasm of corpus uteri, unspecified: Secondary | ICD-10-CM

## 2013-09-10 LAB — BUN: BUN: 17 mg/dL (ref 6–23)

## 2013-09-10 LAB — CREATININE, SERUM: CREATININE: 0.96 mg/dL (ref 0.50–1.10)

## 2013-09-10 NOTE — Progress Notes (Signed)
Scheduled CT of the chest, abdomen, and pelvis with contrast for June 24th 1330 @ [facility].  Called GYN ONC and left message x 2 to return call in reference to setting up referral appt.  Informed pt that it will not be until Monday due to our office closing at noon.  Pt stated understanding.

## 2013-09-10 NOTE — Progress Notes (Signed)
   Subjective:    Patient ID: Kelsey Bruce, female    DOB: 1950/04/28, 63 y.o.   MRN: [id number]  HPI  Kelsey Bruce is here to discuss her [facility] which showed endometrial cancer.  Review of Systems     Objective:   Physical Exam        Assessment & Plan:  I have called the gyn onc office to see if they want me to order any imaging prior to her appt with them. They have as yet not returned my call.

## 2013-09-10 NOTE — Telephone Encounter (Signed)
Called pt with appt for GYN Oncology set for June 26th @ 1145am pt needs to arrive at 1115am.  Left message on VM concerning appt and if she has any questions to please give [place] call here at the clinics.

## 2013-09-13 NOTE — Progress Notes (Signed)
Called patient's insurer [employer]-- prior auth completed for CT imaging. Authorization #[id number] -- good for 30 days.

## 2013-09-15 ENCOUNTER — Ambulatory Visit (HOSPITAL_COMMUNITY)
Admission: RE | Admit: 2013-09-15 | Discharge: 2013-09-15 | Disposition: A | Discharge status: Home / Self Care | Payer: BC Managed Care – PPO | Source: Ambulatory Visit | Attending: Obstetrics & Gynecology | Admitting: Obstetrics & Gynecology

## 2013-09-15 ENCOUNTER — Encounter (HOSPITAL_COMMUNITY): Payer: Self-pay

## 2013-09-15 DIAGNOSIS — M47814 Spondylosis without myelopathy or radiculopathy, thoracic region: Secondary | ICD-10-CM | POA: Insufficient documentation

## 2013-09-15 DIAGNOSIS — C541 Malignant neoplasm of endometrium: Secondary | ICD-10-CM

## 2013-09-15 DIAGNOSIS — R911 Solitary pulmonary nodule: Secondary | ICD-10-CM | POA: Insufficient documentation

## 2013-09-15 DIAGNOSIS — Z0000 Encounter for general adult medical examination without abnormal findings: Secondary | ICD-10-CM

## 2013-09-15 DIAGNOSIS — C549 Malignant neoplasm of corpus uteri, unspecified: Secondary | ICD-10-CM | POA: Insufficient documentation

## 2013-09-15 DIAGNOSIS — R109 Unspecified abdominal pain: Secondary | ICD-10-CM | POA: Insufficient documentation

## 2013-09-15 DIAGNOSIS — Z1231 Encounter for screening mammogram for malignant neoplasm of breast: Secondary | ICD-10-CM | POA: Insufficient documentation

## 2013-09-15 DIAGNOSIS — K7689 Other specified diseases of liver: Secondary | ICD-10-CM | POA: Insufficient documentation

## 2013-09-15 MED ORDER — IOHEXOL 300 MG/ML  SOLN
100.0000 mL | Freq: Once | INTRAMUSCULAR | Status: AC | PRN
  Administered 2013-09-15: 100 mL via INTRAVENOUS

## 2013-09-16 ENCOUNTER — Ambulatory Visit: Payer: BC Managed Care – PPO | Admitting: Obstetrics & Gynecology

## 2013-09-17 ENCOUNTER — Ambulatory Visit: Payer: BC Managed Care – PPO | Attending: Gynecologic Oncology | Admitting: Gynecologic Oncology

## 2013-09-17 ENCOUNTER — Encounter: Payer: Self-pay | Admitting: Gynecologic Oncology

## 2013-09-17 DIAGNOSIS — C549 Malignant neoplasm of corpus uteri, unspecified: Secondary | ICD-10-CM

## 2013-09-17 DIAGNOSIS — C541 Malignant neoplasm of endometrium: Secondary | ICD-10-CM

## 2013-09-17 NOTE — Patient Instructions (Signed)
You have surgery scheduled for October 05, 2013 with Dr. Denman George                  Preparing for your Surgery  Pre-operative Testing -You will receive a phone call from presurgical testing at [facility] to arrange for a pre-operative testing appointment before your surgery.  This appointment normally occurs one to two weeks before your scheduled surgery.   -Bring your insurance card, copy of an advanced directive if applicable, medication list  -At that visit, you will be asked to sign a consent for a possible blood transfusion in case a transfusion becomes necessary during surgery.  The need for a blood transfusion is rare but having consent is a necessary part of your care.     Day Before Surgery at [place] [place] will be asked to take in only clear liquids the day before surgery.  Examples of clear liquids include broths, jello, and clear juices.  You may also be advised to perform a Miralax bowel prep or fleets enema the night before your surgery based off of your provider's recommendations.  You will be advised to have nothing to eat or drink after midnight the evening before.    Your role in recovery Your role is to become active as soon as directed by your doctor, while still giving yourself time to heal.  Rest when you feel tired. You will be asked to do the following in order to speed your recovery:  - Cough and breathe deeply. This helps toclear and expand your lungs and can prevent pneumonia. You may be given a spirometer to practice deep breathing. A staff member will show you how to use the spirometer. - Do mild physical activity. Walking or moving your legs help your circulation and body functions return to normal. A staff member will help you when you try to walk and will provide you with simple exercises. Do not try to get up or walk alone the first time. - Actively manage your pain. Managing your pain lets you move in comfort. We will ask you to rate your pain on a scale of zero  to 10. It is your responsibility to tell your doctor or nurse where and how much you hurt so your pain can be treated.  Special Considerations -If you are diabetic, you may be placed on insulin after surgery to have closer control over your blood sugars to promote healing and recovery.  This does not mean that you will be discharged on insulin.  If applicable, your oral antidiabetics will be resumed when you are tolerating a solid diet.  -Your final pathology results from surgery should be available by the Friday after surgery and the results will be relayed to you when available.  Hysterectomy Information  A hysterectomy is a surgery in which your uterus is removed. This surgery may be done to treat various medical problems. After the surgery, you will no longer have menstrual periods. The surgery will also make you unable to become pregnant (sterile). The fallopian tubes and ovaries can be removed (bilateral salpingo-oophorectomy) during this surgery as well.  REASONS FOR A HYSTERECTOMY  Persistent, abnormal bleeding.  Lasting (chronic) pelvic pain or infection.  The lining of the uterus (endometrium) starts growing outside the uterus (endometriosis).  The endometrium starts growing in the muscle of the uterus (adenomyosis).  The uterus falls down into the vagina (pelvic organ prolapse).  Noncancerous growths in the uterus (uterine fibroids) that cause symptoms.  Precancerous cells.  Cervical cancer or uterine cancer. TYPES OF HYSTERECTOMIES  Supracervical hysterectomy--In this type, the top part of the uterus is removed, but not the cervix.  Total hysterectomy--The uterus and cervix are removed.  Radical hysterectomy--The uterus, the cervix, and the fibrous tissue that holds the uterus in place in the pelvis (parametrium) are removed. WAYS A HYSTERECTOMY CAN BE PERFORMED  Abdominal hysterectomy--A large surgical cut (incision) is made in the abdomen. The uterus is removed  through this incision.  Vaginal hysterectomy--An incision is made in the vagina. The uterus is removed through this incision. There are no abdominal incisions.  Conventional laparoscopic hysterectomy--Three or four small incisions are made in the abdomen. A thin, lighted tube with a camera (laparoscope) is inserted into one of the incisions. Other tools are put through the other incisions. The uterus is cut into small pieces. The small pieces are removed through the incisions, or they are removed through the vagina.  Laparoscopically assisted vaginal hysterectomy (LAVH)--Three or four small incisions are made in the abdomen. Part of the surgery is performed laparoscopically and part vaginally. The uterus is removed through the vagina.  Robot-assisted laparoscopic hysterectomy--A laparoscope and other tools are inserted into 3 or 4 small incisions in the abdomen. A computer-controlled device is used to give the surgeon a 3D image and to help control the surgical instruments. This allows for more precise movements of surgical instruments. The uterus is cut into small pieces and removed through the incisions or removed through the vagina. RISKS AND COMPLICATIONS  Possible complications associated with this procedure include:  Bleeding and risk of blood transfusion. Tell your health care provider if you do not want to receive any blood products.  Blood clots in the legs or lung.  Infection.  Injury to surrounding organs.  Problems or side effects related to anesthesia.  Conversion to an abdominal hysterectomy from one of the other techniques. WHAT TO EXPECT AFTER A HYSTERECTOMY  You will be given pain medicine.  You will need to have someone with you for the first 3-5 days after you go home.  You will need to follow up with your surgeon in 2-4 weeks after surgery to evaluate your progress.  You may have early menopause symptoms such as hot flashes, night sweats, and insomnia.  If you had  a hysterectomy for a problem that was not cancer or not a condition that could lead to cancer, then you no longer need Pap tests. However, even if you no longer need a Pap test, a regular exam is a good idea to make sure no other problems are starting. Document Released: 09/04/2000 Document Revised: 12/30/2012 Document Reviewed: 11/16/2012 [facility] Patient Information 2015 [place], [place]. This information is not intended to replace advice given to you by your health care provider. Make sure you discuss any questions you have with your health care provider.

## 2013-09-17 NOTE — Progress Notes (Signed)
. Consult Note: Gyn-Onc  Consult was requested by Dr. Hulan Fray for the evaluation of Kelsey Bruce 63 y.o. female  CC:  Chief Complaint  Patient presents with  . Uterine cancer    Assessment/Plan:  Ms. Kelsey Bruce  is a 63 y.o.  year old woman (P2) with grade 1 endometrial cancer. A detailed discussion was held with the patient and her family with regard to to her endometrial cancer diagnosis. We discussed the standard management options for uterine cancer which includes surgery followed possibly by adjuvant therapy depending on the results of surgery. The options for surgical management include a hysterectomy and removal of the tubes and ovaries possibly with removal of pelvic lymph nodes. A minimally invasive approach including a robotic hysterectomy or laparoscopic hysterectomy have benefits including shorter hospital stay, recovery time and better wound healing. The alternative approach is an open hysterectomy. The patient has been counseled about these surgical options and the risks of surgery in general including infection, bleeding, damage to surrounding structures (including bowel, bladder, ureters, nerves or vessels), and the postoperative risks of PE/ DVT, and lymphedema. I extensively reviewed the additional risks of robotic hysterectomy including possible need for conversion to open laparotomy. I discussed positioning during surgery of trendelenberg and risks of minor facial swelling and care we take in preoperative positioning. After counseling and consideration of her options, she desires to proceed with robotic hysterectomy bilateral salpingo-oophorectomy and lymph node dissection as indicated.   All of her questions were answered to her satisfaction.   She will be seen by anesthesia for preoperative clearance and discussion of postoperative pain management. She was given the opportunity to ask questions, which were answered to her satisfaction, and she is agreement with the above  mentioned plan of care.     HPI: Kelsey Bruce is a 63 year old woman who is seen in consultation at the request of Dr Hulan Fray for endometrial cancer (grade 1). She  has a  3 month history of postmenopausal bleeding. She presented to Dr Hulan Fray for this and an endometrial biopsy was performed on 6/15 which revealed grade 1 endometrial cancer.  Interval History: She continues to have very light spotting and brown discharge.  Current Meds:  Outpatient Encounter Prescriptions as of 09/17/2013  Medication Sig  . beta carotene w/minerals (OCUVITE) tablet Take 1 tablet by mouth daily.  . cetirizine (ZYRTEC) 10 MG tablet Take 10 mg by mouth daily.  . fluticasone (FLONASE) 50 MCG/ACT nasal spray Place into both nostrils daily.  Marland Kitchen gabapentin (NEURONTIN) 300 MG capsule Take 1 capsule (300 mg total) by mouth as needed.  . meloxicam (MOBIC) 15 MG tablet 1 tab po qd prn musculoskeletal pain  . Multiple Vitamin (MULTIVITAMIN) tablet Take 1 tablet by mouth daily.  . mupirocin ointment (BACTROBAN) 2 % Apply 1 application topically 3 (three) times daily.  Marland Kitchen HYDROcodone-acetaminophen (NORCO/VICODIN) 5-325 MG per tablet 1-2 tabs po q6h prn pain  . [DISCONTINUED] azithromycin (ZITHROMAX) 250 MG tablet   . [DISCONTINUED] misoprostol (CYTOTEC) 200 MCG tablet Take 3 pills by mouth the night before biopsy.    Allergy: No Known Allergies  Social Hx:   History   Social History  . Marital Status: Divorced    Spouse Name: N/A    Number of Children: N/A  . Years of Education: N/A   Occupational History  . Not on file.   Social History Main Topics  . Smoking status: Never Smoker   . Smokeless tobacco: Never Used  . Alcohol Use: Yes  Comment: occasional  . Drug Use: No  . Sexual Activity: No   Other Topics Concern  . Not on file   Social History Narrative   Divorced.  Two children.   Orig from [place], [place].   HS education.   Works at [employer] in [place].   No tobacco, occ alcohol, no drugs.   Has 3  brothers and they all smoked, as did her parents.    Past Surgical Hx:  Past Surgical History  Procedure Laterality Date  . Dilation and curettage of uterus    . Tonsillectomy    . Colonoscopy  2004 (Dr. Henrene Pastor)    poly; recall sent 2009 but pt apparently didn't respond.    Past Medical Hx:  Past Medical History  Diagnosis Date  . Arthritis     LB, HIPs, knees, hands  . Seasonal allergic rhinitis   . Morton's neuroma 2012    Dr. Roseanne Reno did injections in the past  . BPPV (benign paroxysmal positional vertigo)     Past Gynecological History:  LMP was in her 63's. She has ahistory of a tubal ligation.  No LMP recorded. Patient is postmenopausal.  Family Hx:  Family History  Problem Relation Age of Onset  . Heart disease Father   . Hypertension Father   . Cancer Father     lung  . Arthritis Father   . Alcohol abuse Father   . Heart disease Mother   . Cancer Mother     lung  . Deep vein thrombosis Mother   . Alcohol abuse Mother   . Heart disease Brother   . Hypertension Brother   . Heart disease Brother   . Hypertension Brother   . Heart disease Brother   . Hypertension Brother     Review of Systems:  Constitutional  Feels well,   ENT Normal appearing ears and nares bilaterally Skin/Breast  No rash, sores, jaundice, itching, dryness Cardiovascular  No chest pain, shortness of breath, or edema  Pulmonary  No cough or wheeze.  Gastro Intestinal  No nausea, vomitting, or diarrhoea. No bright red blood per rectum, no abdominal pain, change in bowel movement, or constipation.  Genito Urinary  No frequency, urgency, dysuria, + Postmenopausal bleeding/spotting Musculo Skeletal  No myalgia, arthralgia, joint swelling or pain  Neurologic  No weakness, numbness, change in gait,  Psychology  No depression, anxiety, insomnia.   Vitals:  Blood pressure 165/90, pulse 68, temperature 97.6 F (36.4 C), temperature source Oral, resp. rate 20, height 5' 0.25" (1.53 m),  weight 147 lb 14.4 oz (67.087 kg).  Physical Exam: WD in NAD Neck  Supple NROM, without any enlargements.  Lymph Node Survey No cervical supraclavicular or inguinal adenopathy Cardiovascular  Pulse normal rate, regularity and rhythm. S1 and S2 normal.  Lungs  Clear to auscultation bilateraly, without wheezes/crackles/rhonchi. Good air movement.  Skin  No rash/lesions/breakdown  Psychiatry  Alert and oriented to person, place, and time  Abdomen  Normoactive bowel sounds, abdomen soft, non-tender and obese without evidence of hernia. Lower abdominal incision from tubal ligation well healed. Back No CVA tenderness Genito Urinary  Vulva/vagina: Normal external female genitalia.   No lesions. No discharge or bleeding.  Bladder/urethra:  No lesions or masses, well supported bladder  Vagina: normal, slight relaxation  Cervix: Normal appearing, no lesions.  Uterus:  Small, mobile, no parametrial involvement or nodularity.  Adnexa: No masses. Rectal  Good tone, no masses no cul de sac nodularity.  Extremities  No bilateral cyanosis,  clubbing or edema.   @[facility] @ 09/17/2013, 12:22 PM

## 2013-09-20 NOTE — Progress Notes (Signed)
Surgery on 10/05/13 Proep on 09/27/13 at 100pm.  Need orders in EPIC.  Thank You.

## 2013-09-22 ENCOUNTER — Encounter (HOSPITAL_COMMUNITY): Payer: Self-pay | Admitting: Pharmacy Technician

## 2013-09-22 DIAGNOSIS — C541 Malignant neoplasm of endometrium: Secondary | ICD-10-CM

## 2013-09-22 HISTORY — DX: Malignant neoplasm of endometrium: C54.1

## 2013-09-28 ENCOUNTER — Encounter (HOSPITAL_COMMUNITY)
Admission: RE | Admit: 2013-09-28 | Discharge: 2013-09-28 | Disposition: A | Discharge status: Home / Self Care | Payer: BC Managed Care – PPO | Source: Ambulatory Visit | Attending: Gynecologic Oncology | Admitting: Gynecologic Oncology

## 2013-09-28 ENCOUNTER — Encounter (HOSPITAL_COMMUNITY): Payer: Self-pay

## 2013-09-28 DIAGNOSIS — Z01812 Encounter for preprocedural laboratory examination: Secondary | ICD-10-CM | POA: Insufficient documentation

## 2013-09-28 HISTORY — DX: Cerebral palsy, unspecified: G80.9

## 2013-09-28 LAB — CBC WITH DIFFERENTIAL/PLATELET
BASOS ABS: 0 10*3/uL (ref 0.0–0.1)
BASOS PCT: 1 % (ref 0–1)
EOS ABS: 0.1 10*3/uL (ref 0.0–0.7)
Eosinophils Relative: 2 % (ref 0–5)
HEMATOCRIT: 45.5 % (ref 36.0–46.0)
Hemoglobin: 15.4 g/dL — ABNORMAL HIGH (ref 12.0–15.0)
Lymphocytes Relative: 40 % (ref 12–46)
Lymphs Abs: 3.1 10*3/uL (ref 0.7–4.0)
MCH: 29.7 pg (ref 26.0–34.0)
MCHC: 33.8 g/dL (ref 30.0–36.0)
MCV: 87.7 fL (ref 78.0–100.0)
MONO ABS: 0.8 10*3/uL (ref 0.1–1.0)
Monocytes Relative: 10 % (ref 3–12)
NEUTROS PCT: 47 % (ref 43–77)
Neutro Abs: 3.7 10*3/uL (ref 1.7–7.7)
Platelets: 329 10*3/uL (ref 150–400)
RBC: 5.19 MIL/uL — ABNORMAL HIGH (ref 3.87–5.11)
RDW: 12.2 % (ref 11.5–15.5)
WBC: 7.7 10*3/uL (ref 4.0–10.5)

## 2013-09-28 LAB — COMPREHENSIVE METABOLIC PANEL
ALBUMIN: 4.1 g/dL (ref 3.5–5.2)
ALT: 21 U/L (ref 0–35)
ANION GAP: 12 (ref 5–15)
AST: 23 U/L (ref 0–37)
Alkaline Phosphatase: 76 U/L (ref 39–117)
BILIRUBIN TOTAL: 0.5 mg/dL (ref 0.3–1.2)
BUN: 16 mg/dL (ref 6–23)
CALCIUM: 9.8 mg/dL (ref 8.4–10.5)
CHLORIDE: 101 meq/L (ref 96–112)
CO2: 27 mEq/L (ref 19–32)
CREATININE: 0.7 mg/dL (ref 0.50–1.10)
GFR calc Af Amer: >90 mL/min (ref >90)
GFR calc non Af Amer: >90 mL/min (ref >90)
Glucose, Bld: 94 mg/dL (ref 70–99)
Potassium: 4.5 mEq/L (ref 3.7–5.3)
Sodium: 140 mEq/L (ref 137–147)
TOTAL PROTEIN: 7.7 g/dL (ref 6.0–8.3)

## 2013-09-28 LAB — URINALYSIS, ROUTINE W REFLEX MICROSCOPIC
Bilirubin Urine: NEGATIVE
GLUCOSE, UA: NEGATIVE mg/dL
Hgb urine dipstick: NEGATIVE
Ketones, ur: NEGATIVE mg/dL
NITRITE: NEGATIVE
Protein, ur: NEGATIVE mg/dL
SPECIFIC GRAVITY, URINE: 1.003 — AB (ref 1.005–1.030)
Urobilinogen, UA: 0.2 mg/dL (ref 0.0–1.0)
pH: 7 (ref 5.0–8.0)

## 2013-09-28 LAB — URINE MICROSCOPIC-ADD ON

## 2013-09-28 NOTE — Patient Instructions (Addendum)
Kelsey Bruce  09/28/2013                           YOUR PROCEDURE IS SCHEDULED ON: 10/05/13 at 7:30 am               [facility]  SIGNS TO SHORT STAY CENTER                 ARRIVE AT SHORT STAY AT: 5:30 am               CALL THIS NUMBER IF ANY PROBLEMS THE DAY OF SURGERY :               [phone]                                REMEMBER:   Do not eat food or drink liquids AFTER MIDNIGHT                 Take these medicines the morning of surgery with               A SIPS OF WATER :     NONE               CLEAR LIQUIDS ONLY FOR 24 HRS PREOP     CLEAR LIQUID DIET   Foods Allowed                                                                     Foods Excluded  Coffee and tea, regular and decaf                             liquids that you cannot  Plain Jell-O in any flavor                                             see through such as: Fruit ices (not with fruit pulp)                                     milk, soups, orange juice  Iced Popsicles                                    All solid food Carbonated beverages, regular and diet                                    Cranberry, grape and apple juices Sports drinks like Gatorade Lightly seasoned clear broth or consume(fat  free) Sugar, honey syrup  _____________________________________________________________________     Do not wear jewelry, make-up   Do not wear lotions, powders, or perfumes.   Do not shave legs or underarms 12 hrs. before surgery (men may shave face)  Do not bring valuables to the hospital.  Contacts, dentures or bridgework may not be worn into surgery.  Leave suitcase in the car. After surgery it may be brought to your room.  For patients admitted to the hospital more than one night, checkout time is            11:00 AM                                                          ________________________________________________________________________                                                                         Kelsey Bruce  Before surgery, you can play an important role.  Because skin is not sterile, your skin needs to be as free of germs as possible.  You can reduce the number of germs on your skin by washing with CHG (chlorahexidine gluconate) soap before surgery.  CHG is an antiseptic cleaner which kills germs and bonds with the skin to continue killing germs even after washing. Please DO NOT use if you have an allergy to CHG or antibacterial soaps.  If your skin becomes reddened/irritated stop using the CHG and inform your nurse when you arrive at Short Stay. Do not shave (including legs and underarms) for at least 48 hours prior to the first CHG shower.  You may shave your face. Please follow these instructions carefully:   1.  Shower with CHG Soap the night before surgery and the  morning of Surgery.   2.  If you choose to wash your hair, wash your hair first as usual with your  normal  Shampoo.   3.  After you shampoo, rinse your hair and body thoroughly to remove the  shampoo.                                         4.  Use CHG as you would any other liquid soap.  You can apply chg directly  to the skin and wash . Gently wash with scrungie or clean wascloth    5.  Apply the CHG Soap to your body ONLY FROM THE NECK DOWN.   Do not use on open                           Wound or open sores. Avoid contact with eyes, ears mouth and genitals (private parts).                        Genitals (private parts) with your normal soap.  6.  Wash thoroughly, paying special attention to the area where your surgery  will be performed.   7.  Thoroughly rinse your body with warm water from the neck down.   8.  DO NOT shower/wash with your normal soap after using and rinsing off  the CHG Soap .                9.  Pat  yourself dry with a clean towel.             10.  Wear clean pajamas.             11.  Place clean sheets on your bed the night of your first shower and do not  sleep with pets.  Day of Surgery : Do not apply any lotions/deodorants the morning of surgery.  Please wear clean clothes to the hospital/surgery center.  FAILURE TO FOLLOW THESE INSTRUCTIONS MAY RESULT IN THE CANCELLATION OF YOUR SURGERY    PATIENT SIGNATURE_________________________________  ______________________________________________________________________     Kelsey Bruce  An incentive spirometer is a tool that can help keep your lungs clear and active. This tool measures how well you are filling your lungs with each breath. Taking long deep breaths may help reverse or decrease the chance of developing breathing (pulmonary) problems (especially infection) following:  A long period of time when you are unable to move or be active. BEFORE THE PROCEDURE   If the spirometer includes an indicator to show your best effort, your nurse or respiratory therapist will set it to a desired goal.  If possible, sit up straight or lean slightly forward. Try not to slouch.  Hold the incentive spirometer in an upright position. INSTRUCTIONS FOR USE  1. Sit on the edge of your bed if possible, or sit up as far as you can in bed or on a chair. 2. Hold the incentive spirometer in an upright position. 3. Breathe out normally. 4. Place the mouthpiece in your mouth and seal your lips tightly around it. 5. Breathe in slowly and as deeply as possible, raising the piston or the ball toward the top of the column. 6. Hold your breath for 3-5 seconds or for as long as possible. Allow the piston or ball to fall to the bottom of the column. 7. Remove the mouthpiece from your mouth and breathe out normally. 8. Rest for a few seconds and repeat Steps 1 through 7 at least 10 times every 1-2 hours when you are awake. Take your time and  take a few normal breaths between deep breaths. 9. The spirometer may include an indicator to show your best effort. Use the indicator as a goal to work toward during each repetition. 10. After each set of 10 deep breaths, practice coughing to be sure your lungs are clear. If you have an incision (the cut made at the time of surgery), support your incision when coughing by placing a pillow or rolled up towels firmly against it. Once you are able to get out of bed, walk around indoors and cough well. You may stop using the incentive spirometer when instructed by your caregiver.  RISKS AND COMPLICATIONS  Take your time so you do not get dizzy or light-headed.  If you are in pain, you may need to take or ask for pain medication before doing incentive spirometry. It is harder to take a deep breath if you are having pain. AFTER USE  Rest and breathe slowly and easily.  It can be  helpful to keep track of a log of your progress. Your caregiver can provide you with a simple table to help with this. If you are using the spirometer at home, follow these instructions: [place] IF:   You are having difficultly using the spirometer.  You have trouble using the spirometer as often as instructed.  Your pain medication is not giving enough relief while using the spirometer.  You develop fever of 100.5 F (38.1 C) or higher. SEEK IMMEDIATE MEDICAL CARE IF:   You cough up bloody sputum that had not been present before.  You develop fever of 102 F (38.9 C) or greater.  You develop worsening pain at or near the incision site. MAKE SURE YOU:   Understand these instructions.  Will watch your condition.  Will get help right away if you are not doing well or get worse. Document Released: 07/22/2006 Document Revised: 06/03/2011 Document Reviewed: 09/22/2006 Exit[employer] Patient Information 2014 ExitCare,  [place].   ________________________________________________________________________  WHAT IS A BLOOD TRANSFUSION? Blood Transfusion Information  A transfusion is the replacement of blood or some of its parts. Blood is made up of multiple cells which provide different functions.  Red blood cells carry oxygen and are used for blood loss replacement.  White blood cells fight against infection.  Platelets control bleeding.  Plasma helps clot blood.  Other blood products are available for specialized needs, such as hemophilia or other clotting disorders. BEFORE THE TRANSFUSION  Who gives blood for transfusions?   Healthy volunteers who are fully evaluated to make sure their blood is safe. This is blood bank blood. Transfusion therapy is the safest it has ever been in the practice of medicine. Before blood is taken from a donor, a complete history is taken to make sure that person has no history of diseases nor engages in risky social behavior (examples are intravenous drug use or sexual activity with multiple partners). The donor's travel history is screened to minimize risk of transmitting infections, such as malaria. The donated blood is tested for signs of infectious diseases, such as HIV and hepatitis. The blood is then tested to be sure it is compatible with you in order to minimize the chance of a transfusion reaction. If you or a relative donates blood, this is often done in anticipation of surgery and is not appropriate for emergency situations. It takes many days to process the donated blood. RISKS AND COMPLICATIONS Although transfusion therapy is very safe and saves many lives, the main dangers of transfusion include:   Getting an infectious disease.  Developing a transfusion reaction. This is an allergic reaction to something in the blood you were given. Every precaution is taken to prevent this. The decision to have a blood transfusion has been considered carefully by your caregiver  before blood is given. Blood is not given unless the benefits outweigh the risks. AFTER THE TRANSFUSION  Right after receiving a blood transfusion, you will usually feel much better and more energetic. This is especially true if your red blood cells have gotten low (anemic). The transfusion raises the level of the red blood cells which carry oxygen, and this usually causes an energy increase.  The nurse administering the transfusion will monitor you carefully for complications. HOME CARE INSTRUCTIONS  No special instructions are needed after a transfusion. You may find your energy is better. Speak with your caregiver about any limitations on activity for underlying diseases you may have. SEEK MEDICAL CARE IF:   Your condition is not  improving after your transfusion.  You develop redness or irritation at the intravenous (IV) site. SEEK IMMEDIATE MEDICAL CARE IF:  Any of the following symptoms occur over the next 12 hours:  Shaking chills.  You have a temperature by mouth above 102 F (38.9 C), not controlled by medicine.  Chest, back, or muscle pain.  People around you feel you are not acting correctly or are confused.  Shortness of breath or difficulty breathing.  Dizziness and fainting.  You get a rash or develop hives.  You have a decrease in urine output.  Your urine turns a dark color or changes to pink, red, or brown. Any of the following symptoms occur over the next 10 days:  You have a temperature by mouth above 102 F (38.9 C), not controlled by medicine.  Shortness of breath.  Weakness after normal activity.  The white part of the eye turns yellow (jaundice).  You have a decrease in the amount of urine or are urinating less often.  Your urine turns a dark color or changes to pink, red, or brown. Document Released: 03/08/2000 Document Revised: 06/03/2011 Document Reviewed: 10/26/2007 [facility] Patient Information 2014 [place],  [place].  _______________________________________________________________________

## 2013-10-05 ENCOUNTER — Encounter (HOSPITAL_COMMUNITY)
Admission: RE | Disposition: A | Discharge status: Home / Self Care | Payer: Self-pay | Source: Ambulatory Visit | Attending: Gynecologic Oncology

## 2013-10-05 ENCOUNTER — Encounter (HOSPITAL_COMMUNITY): Payer: BC Managed Care – PPO | Admitting: *Deleted

## 2013-10-05 ENCOUNTER — Ambulatory Visit (HOSPITAL_COMMUNITY): Payer: BC Managed Care – PPO | Admitting: *Deleted

## 2013-10-05 ENCOUNTER — Observation Stay (HOSPITAL_COMMUNITY)
Admission: RE | Admit: 2013-10-05 | Discharge: 2013-10-06 | Disposition: A | Discharge status: Home / Self Care | Payer: BC Managed Care – PPO | Source: Ambulatory Visit | Attending: Gynecologic Oncology | Admitting: Gynecologic Oncology

## 2013-10-05 ENCOUNTER — Encounter (HOSPITAL_COMMUNITY): Payer: Self-pay | Admitting: *Deleted

## 2013-10-05 DIAGNOSIS — C549 Malignant neoplasm of corpus uteri, unspecified: Secondary | ICD-10-CM

## 2013-10-05 DIAGNOSIS — C541 Malignant neoplasm of endometrium: Secondary | ICD-10-CM

## 2013-10-05 DIAGNOSIS — C55 Malignant neoplasm of uterus, part unspecified: Secondary | ICD-10-CM | POA: Diagnosis present

## 2013-10-05 DIAGNOSIS — G809 Cerebral palsy, unspecified: Secondary | ICD-10-CM | POA: Insufficient documentation

## 2013-10-05 DIAGNOSIS — Z79899 Other long term (current) drug therapy: Secondary | ICD-10-CM | POA: Insufficient documentation

## 2013-10-05 HISTORY — PX: ROBOTIC ASSISTED TOTAL HYSTERECTOMY WITH BILATERAL SALPINGO OOPHERECTOMY: SHX6086

## 2013-10-05 LAB — TYPE AND SCREEN
ABO/RH(D): O POS
Antibody Screen: NEGATIVE

## 2013-10-05 LAB — ABO/RH: ABO/RH(D): O POS

## 2013-10-05 SURGERY — ROBOTIC ASSISTED TOTAL HYSTERECTOMY WITH BILATERAL SALPINGO OOPHORECTOMY
Anesthesia: General | Laterality: Bilateral

## 2013-10-05 MED ORDER — SUFENTANIL CITRATE 50 MCG/ML IV SOLN
INTRAVENOUS | Status: DC | PRN
  Administered 2013-10-05: 5 ug via INTRAVENOUS
  Administered 2013-10-05: 10 ug via INTRAVENOUS
  Administered 2013-10-05 (×3): 5 ug via INTRAVENOUS

## 2013-10-05 MED ORDER — CEFAZOLIN SODIUM-DEXTROSE 2-3 GM-% IV SOLR
2.0000 g | INTRAVENOUS | Status: AC
  Administered 2013-10-05: 2 g via INTRAVENOUS

## 2013-10-05 MED ORDER — LORATADINE 10 MG PO TABS
10.0000 mg | ORAL_TABLET | Freq: Every day | ORAL | Status: DC
  Administered 2013-10-05: 10 mg via ORAL
  Filled 2013-10-05 (×2): qty 1

## 2013-10-05 MED ORDER — HEPARIN SODIUM (PORCINE) 1000 UNIT/ML IJ SOLN
INTRAMUSCULAR | Status: AC
  Filled 2013-10-05: qty 1

## 2013-10-05 MED ORDER — CISATRACURIUM BESYLATE (PF) 10 MG/5ML IV SOLN
INTRAVENOUS | Status: DC | PRN
  Administered 2013-10-05: 4 mg via INTRAVENOUS

## 2013-10-05 MED ORDER — ONDANSETRON HCL 4 MG/2ML IJ SOLN
INTRAMUSCULAR | Status: DC | PRN
  Administered 2013-10-05: 4 mg via INTRAVENOUS

## 2013-10-05 MED ORDER — PROPOFOL 10 MG/ML IV BOLUS
INTRAVENOUS | Status: AC
  Filled 2013-10-05: qty 20

## 2013-10-05 MED ORDER — LIDOCAINE HCL (CARDIAC) 20 MG/ML IV SOLN
INTRAVENOUS | Status: DC | PRN
  Administered 2013-10-05: 5 mg via INTRAVENOUS

## 2013-10-05 MED ORDER — MEPERIDINE HCL 50 MG/ML IJ SOLN: 6.2500 mg | INTRAMUSCULAR | Status: DC | PRN

## 2013-10-05 MED ORDER — ONDANSETRON HCL 4 MG/2ML IJ SOLN
INTRAMUSCULAR | Status: AC
  Filled 2013-10-05: qty 2

## 2013-10-05 MED ORDER — KCL IN DEXTROSE-NACL 20-5-0.45 MEQ/L-%-% IV SOLN
INTRAVENOUS | Status: AC
  Filled 2013-10-05: qty 1000

## 2013-10-05 MED ORDER — HYDROMORPHONE HCL PF 1 MG/ML IJ SOLN: 0.5000 mg | INTRAMUSCULAR | Status: DC | PRN

## 2013-10-05 MED ORDER — LIDOCAINE HCL (CARDIAC) 20 MG/ML IV SOLN
INTRAVENOUS | Status: AC
  Filled 2013-10-05: qty 5

## 2013-10-05 MED ORDER — NEOSTIGMINE METHYLSULFATE 10 MG/10ML IV SOLN
INTRAVENOUS | Status: DC | PRN
  Administered 2013-10-05: 4 mg via INTRAVENOUS

## 2013-10-05 MED ORDER — MIDAZOLAM HCL 5 MG/5ML IJ SOLN
INTRAMUSCULAR | Status: DC | PRN
  Administered 2013-10-05 (×2): 1 mg via INTRAVENOUS

## 2013-10-05 MED ORDER — KETOROLAC TROMETHAMINE 30 MG/ML IJ SOLN
30.0000 mg | Freq: Four times a day (QID) | INTRAMUSCULAR | Status: AC
  Filled 2013-10-05 (×4): qty 1

## 2013-10-05 MED ORDER — CEFAZOLIN SODIUM-DEXTROSE 2-3 GM-% IV SOLR
INTRAVENOUS | Status: AC
  Filled 2013-10-05: qty 50

## 2013-10-05 MED ORDER — ROCURONIUM BROMIDE 100 MG/10ML IV SOLN
INTRAVENOUS | Status: AC
  Filled 2013-10-05: qty 1

## 2013-10-05 MED ORDER — HYDROMORPHONE HCL PF 2 MG/ML IJ SOLN
INTRAMUSCULAR | Status: AC
Start: 2013-10-05 — End: 2013-10-05
  Filled 2013-10-05: qty 1

## 2013-10-05 MED ORDER — NEOSTIGMINE METHYLSULFATE 10 MG/10ML IV SOLN
INTRAVENOUS | Status: AC
  Filled 2013-10-05: qty 1

## 2013-10-05 MED ORDER — GLYCOPYRROLATE 0.2 MG/ML IJ SOLN
INTRAMUSCULAR | Status: DC | PRN
  Administered 2013-10-05: .6 mg via INTRAVENOUS

## 2013-10-05 MED ORDER — ONDANSETRON HCL 4 MG/2ML IJ SOLN
4.0000 mg | Freq: Four times a day (QID) | INTRAMUSCULAR | Status: DC | PRN
  Administered 2013-10-05: 4 mg via INTRAVENOUS
  Filled 2013-10-05: qty 2

## 2013-10-05 MED ORDER — LACTATED RINGERS IV SOLN
INTRAVENOUS | Status: DC | PRN
  Administered 2013-10-05: 08:00:00 via INTRAVENOUS

## 2013-10-05 MED ORDER — LACTATED RINGERS IR SOLN
Status: DC | PRN
  Administered 2013-10-05: 200 mL
  Administered 2013-10-05: 100 mL

## 2013-10-05 MED ORDER — OXYCODONE-ACETAMINOPHEN 5-325 MG PO TABS: 1.0000 | ORAL_TABLET | ORAL | Status: DC | PRN

## 2013-10-05 MED ORDER — SUFENTANIL CITRATE 50 MCG/ML IV SOLN
INTRAVENOUS | Status: AC
Start: 2013-10-05 — End: 2013-10-05
  Filled 2013-10-05: qty 1

## 2013-10-05 MED ORDER — STERILE WATER FOR IRRIGATION IR SOLN
Status: DC | PRN
  Administered 2013-10-05: 1500 mL

## 2013-10-05 MED ORDER — KCL IN DEXTROSE-NACL 20-5-0.45 MEQ/L-%-% IV SOLN
INTRAVENOUS | Status: DC
  Administered 2013-10-05 (×2): via INTRAVENOUS
  Filled 2013-10-05 (×3): qty 1000

## 2013-10-05 MED ORDER — LACTATED RINGERS IV SOLN
INTRAVENOUS | Status: DC
  Administered 2013-10-05: 10:00:00 via INTRAVENOUS

## 2013-10-05 MED ORDER — METOCLOPRAMIDE HCL 5 MG/ML IJ SOLN
INTRAMUSCULAR | Status: DC | PRN
  Administered 2013-10-05: 10 mg via INTRAVENOUS

## 2013-10-05 MED ORDER — PROPOFOL 10 MG/ML IV BOLUS
INTRAVENOUS | Status: DC | PRN
  Administered 2013-10-05: 170 mg via INTRAVENOUS

## 2013-10-05 MED ORDER — PROMETHAZINE HCL 25 MG/ML IJ SOLN: 6.2500 mg | INTRAMUSCULAR | Status: DC | PRN

## 2013-10-05 MED ORDER — ROCURONIUM BROMIDE 100 MG/10ML IV SOLN
INTRAVENOUS | Status: DC | PRN
  Administered 2013-10-05: 40 mg via INTRAVENOUS

## 2013-10-05 MED ORDER — ONDANSETRON HCL 4 MG PO TABS: 4.0000 mg | ORAL_TABLET | Freq: Four times a day (QID) | ORAL | Status: DC | PRN

## 2013-10-05 MED ORDER — LACTATED RINGERS IV SOLN
INTRAVENOUS | Status: DC | PRN
  Administered 2013-10-05 (×2): via INTRAVENOUS

## 2013-10-05 MED ORDER — KETOROLAC TROMETHAMINE 30 MG/ML IJ SOLN
30.0000 mg | Freq: Four times a day (QID) | INTRAMUSCULAR | Status: AC
  Administered 2013-10-05 – 2013-10-06 (×4): 30 mg via INTRAVENOUS
  Filled 2013-10-05 (×4): qty 1

## 2013-10-05 MED ORDER — PHENYLEPHRINE HCL 10 MG/ML IJ SOLN
INTRAMUSCULAR | Status: DC | PRN
  Administered 2013-10-05: 80 ug via INTRAVENOUS

## 2013-10-05 MED ORDER — MIDAZOLAM HCL 2 MG/2ML IJ SOLN
INTRAMUSCULAR | Status: AC
  Filled 2013-10-05: qty 2

## 2013-10-05 MED ORDER — ENOXAPARIN SODIUM 40 MG/0.4ML ~~LOC~~ SOLN
40.0000 mg | SUBCUTANEOUS | Status: DC
  Administered 2013-10-06: 40 mg via SUBCUTANEOUS
  Filled 2013-10-05 (×2): qty 0.4

## 2013-10-05 MED ORDER — GLYCOPYRROLATE 0.2 MG/ML IJ SOLN
INTRAMUSCULAR | Status: AC
  Filled 2013-10-05: qty 3

## 2013-10-05 MED ORDER — HYDROMORPHONE HCL PF 1 MG/ML IJ SOLN: 0.2500 mg | INTRAMUSCULAR | Status: DC | PRN

## 2013-10-05 MED ORDER — HYDROMORPHONE HCL PF 1 MG/ML IJ SOLN
INTRAMUSCULAR | Status: DC | PRN
  Administered 2013-10-05 (×4): 0.5 mg via INTRAVENOUS

## 2013-10-05 MED ORDER — DEXAMETHASONE SODIUM PHOSPHATE 10 MG/ML IJ SOLN
INTRAMUSCULAR | Status: DC | PRN
  Administered 2013-10-05: 5 mg via INTRAVENOUS

## 2013-10-05 SURGICAL SUPPLY — 59 items
APL SKNCLS STERI-STRIP NONHPOA (GAUZE/BANDAGES/DRESSINGS) ×1
BAG SPEC RTRVL LRG 6X4 10 (ENDOMECHANICALS) ×2
BENZOIN TINCTURE PRP APPL 2/3 (GAUZE/BANDAGES/DRESSINGS) ×2 IMPLANT
CABLE HIGH FREQUENCY MONO STRZ (ELECTRODE) ×2 IMPLANT
CHLORAPREP W/TINT 26ML (MISCELLANEOUS) ×2 IMPLANT
CORDS BIPOLAR (ELECTRODE) ×2 IMPLANT
COVER MAYO STAND STRL (DRAPES) ×1 IMPLANT
COVER SURGICAL LIGHT HANDLE (MISCELLANEOUS) ×2 IMPLANT
COVER TIP SHEARS 8 DVNC (MISCELLANEOUS) ×1 IMPLANT
COVER TIP SHEARS 8MM DA VINCI (MISCELLANEOUS) ×1
DRAPE LG THREE QUARTER DISP (DRAPES) ×4 IMPLANT
DRAPE SURG IRRIG POUCH 19X23 (DRAPES) ×2 IMPLANT
DRAPE TABLE BACK 44X90 PK DISP (DRAPES) ×4 IMPLANT
DRAPE UTILITY XL STRL (DRAPES) ×2 IMPLANT
DRAPE WARM FLUID 44X44 (DRAPE) ×2 IMPLANT
DRSG TEGADERM 2-3/8X2-3/4 SM (GAUZE/BANDAGES/DRESSINGS) ×6 IMPLANT
DRSG TEGADERM 4X4.75 (GAUZE/BANDAGES/DRESSINGS) ×2 IMPLANT
DRSG TEGADERM 6X8 (GAUZE/BANDAGES/DRESSINGS) ×4 IMPLANT
ELECT REM PT RETURN 9FT ADLT (ELECTROSURGICAL) ×2
ELECTRODE REM PT RTRN 9FT ADLT (ELECTROSURGICAL) ×1 IMPLANT
GAUZE SPONGE 2X2 8PLY STRL LF (GAUZE/BANDAGES/DRESSINGS) ×1 IMPLANT
GLOVE BIO SURGEON STRL SZ 6 (GLOVE) ×3 IMPLANT
GLOVE BIO SURGEON STRL SZ 6.5 (GLOVE) ×4 IMPLANT
GLOVE BIO SURGEON STRL SZ7.5 (GLOVE) ×2 IMPLANT
GLOVE BIOGEL PI IND STRL 7.0 (GLOVE) ×2 IMPLANT
GLOVE BIOGEL PI INDICATOR 7.0 (GLOVE) ×2
GOWN STRL REUS W/ TWL LRG LVL3 (GOWN DISPOSABLE) IMPLANT
GOWN STRL REUS W/ TWL XL LVL3 (GOWN DISPOSABLE) ×2 IMPLANT
GOWN STRL REUS W/TWL LRG LVL3 (GOWN DISPOSABLE) ×12
GOWN STRL REUS W/TWL XL LVL3 (GOWN DISPOSABLE)
HOLDER FOLEY CATH W/STRAP (MISCELLANEOUS) ×2 IMPLANT
KIT ACCESSORY DA VINCI DISP (KITS) ×1
KIT ACCESSORY DVNC DISP (KITS) ×1 IMPLANT
KIT BASIN OR (CUSTOM PROCEDURE TRAY) ×2 IMPLANT
MANIPULATOR UTERINE 4.5 ZUMI (MISCELLANEOUS) ×2 IMPLANT
OCCLUDER COLPOPNEUMO (BALLOONS) ×2 IMPLANT
POUCH SPECIMEN RETRIEVAL 10MM (ENDOMECHANICALS) ×4 IMPLANT
SET TUBE IRRIG SUCTION NO TIP (IRRIGATION / IRRIGATOR) ×2 IMPLANT
SHEET LAVH (DRAPES) ×2 IMPLANT
SOLUTION ANTI FOG 6CC (MISCELLANEOUS) ×2 IMPLANT
SOLUTION ELECTROLUBE (MISCELLANEOUS) ×2 IMPLANT
SPONGE GAUZE 2X2 STER 10/PKG (GAUZE/BANDAGES/DRESSINGS) ×1
SPONGE LAP 18X18 X RAY DECT (DISPOSABLE) IMPLANT
STRIP CLOSURE SKIN 1/2X4 (GAUZE/BANDAGES/DRESSINGS) ×2 IMPLANT
SUT VIC AB 0 CT1 27 (SUTURE) ×6
SUT VIC AB 0 CT1 27XBRD ANTBC (SUTURE) ×3 IMPLANT
SUT VIC AB 4-0 PS2 27 (SUTURE) ×4 IMPLANT
SUT VICRYL 0 UR6 27IN ABS (SUTURE) ×2 IMPLANT
SYR BULB IRRIGATION 50ML (SYRINGE) IMPLANT
SYRINGE 60CC LL (MISCELLANEOUS) ×2 IMPLANT
TOWEL OR 17X26 10 PK STRL BLUE (TOWEL DISPOSABLE) ×4 IMPLANT
TRAP SPECIMEN MUCOUS 40CC (MISCELLANEOUS) ×1 IMPLANT
TRAY FOLEY CATH 14FRSI W/METER (CATHETERS) ×2 IMPLANT
TRAY LAP CHOLE (CUSTOM PROCEDURE TRAY) ×2 IMPLANT
TROCAR 12M 150ML BLUNT (TROCAR) ×2 IMPLANT
TROCAR BLADELESS OPT 5 100 (ENDOMECHANICALS) ×2 IMPLANT
TROCAR XCEL 12X100 BLDLESS (ENDOMECHANICALS) ×3 IMPLANT
TUBING INSUFFLATION 10FT LAP (TUBING) ×2 IMPLANT
WATER STERILE IRR 1500ML POUR (IV SOLUTION) ×4 IMPLANT

## 2013-10-05 NOTE — Anesthesia Procedure Notes (Signed)
Performed by: Everitt Amber

## 2013-10-05 NOTE — Anesthesia Postprocedure Evaluation (Signed)
  Anesthesia Post-op Note  Patient: Kelsey Bruce  Procedure(s) Performed: Procedure(s) (LRB): ROBOTIC ASSISTED TOTAL HYSTERECTOMY WITH BILATERAL SALPINGO OOPHORECTOMY WITH LYMPH NODE DISECTION (Bilateral)  Patient Location: PACU  Anesthesia Type: General  Level of Consciousness: awake and alert   Airway and Oxygen Therapy: Patient Spontanous Breathing  Post-op Pain: mild  Post-op Assessment: Post-op Vital signs reviewed, Patient's Cardiovascular Status Stable, Respiratory Function Stable, Patent Airway and No signs of Nausea or vomiting  Last Vitals:  Filed Vitals:   10/05/13 1500  BP: 114/66  Pulse: 70  Temp: 36.7 C  Resp: 18    Post-op Vital Signs: stable   Complications: No apparent anesthesia complications

## 2013-10-05 NOTE — Interval H&P Note (Signed)
History and Physical Interval Note:  10/05/2013 7:18 AM  Kelsey Bruce  has presented today for surgery, with the diagnosis of uterine cancer  The various methods of treatment have been discussed with the patient and family. After consideration of risks, benefits and other options for treatment, the patient has consented to  Procedure(s): ROBOTIC ASSISTED TOTAL HYSTERECTOMY WITH BILATERAL SALPINGO OOPHORECTOMY WITH LYMPH NODE DISECTION (Bilateral) as a surgical intervention .  The patient's history has been reviewed, patient examined, no change in status, stable for surgery.  I have reviewed the patient's chart and labs.  Questions were answered to the patient's satisfaction.     Donaciano Eva

## 2013-10-05 NOTE — Op Note (Signed)
Preoperative Diagnosis: grade 1 endometrioid endometrial cancer  Postoperative Diagnosis: same  Procedure(s) Performed: Robotic total laparoscopic hysterectomy, Bilateral salpingo oophorectomy,  Bilateral pelvic lymph node dissection  Anesthesia: General with ETT  Surgeon: Everitt Amber, M.D.  Assistant Surgeon:Lisa Jackson-Moore MD.   Specimens: Uterus cervix,bilateral tubes and ovaries, bilateral pelvic lymph nodes. Pelvic washings.  Estimated Blood Loss: 24mL.    Complications: none  Indication for Procedure: This is a 34 age-old who underwent a biopsy for postmenopausal bleeding which revealed "at least" grade 1 endometrial cancer.  Operative Findings:  6 cm uterus. normal adnexa. No masses.   Procedure: Patient was taken to the operating room and placed under general endotracheal anesthesia without any difficulty. She is placed in the dorsal lithotomy position and secured to the operative table. Shoulder blocks were applied at the acromi um processes.   The patient was prepped and draped and the uterine manipulator placed within the endometrial cavity. The appropriately sized Koh ring was circumferentially around the cervix. The balloon was placed within the vagina. An OG tube was present and functional. At an area on the left in line with the nipple approximately 2 cm below the ribs the area was selected for a 5 mm Optiview inserted under direct visualization. The abdomen was insufflated to 15 mm of mercury and the pressure never deviated above that throughout the remainder of the procedure. Maximum Trendelenburg positioning was obtained. At approximately 22 cm proximal to the symphysis pubis an incision was made just superior to the umbilicus. as well as the location 10 cm lateral to this incision and 2 cm superior to the left anterior superior iliac spine. Incisions were made. 10 mm trocar was inserted in the superior umbilicus incision. 8 millimeter robotic ports were placed in the other  3 incisions. The left upper quadrant port site was replaced with a 10 mm port. This was all completed under direct visualization. The small and large bowel were reflected as much as possible into the upper abdomen. The robot was docked and instruments placed.  The right round ligament was transected and the ureter was identified. The right infundibulopelvic ligament was cauterized and transected The retroperitoneal space was entered on the right and the peritoneum incised to the level of the vesicouterine ligament anteriorly. The bladder flap was created using Bovie cautery. The peritoneal dissection was continued inferiorly and across the inferior most aspect of the cervix. In this manner the urethra was deflected inferiorly. The bladder flap was further developed. The uterine vessels on the right were skeletonized ligated and transected.  The left ureter was identified. The left gonadal vessels were cauterized and transected. The broad ligament was skeletonized posteriorly to the level of the cervix and the peritoneum dissected free from the cervix and in this fashion the ureter was deflected inferiorly. The anterior peritoneum was further dissected and the bladder flap appropriately developed. The uterine vessels were skeletonized cauterized and transected. The balloon and the vagina was then maximally insufflated. A olpotomy incision was made circumferentially and the uterus cervix ovaries and tubes were ivered from the vagina. The Koh ring was removed and the balloon was replaced.  Right pelvic lymph node dissection was then initiated. The superior vesicle artery was identified and the vesicouterine space developed. The obturator nerve was identified. Nodal tissue was removed within the boundaries of the right genitofemoral nerve the right circumflex vein, the ureter and the superior vesicle artery.  The nodal tissue was placed in an Endo Catch bag. The left pelvic lymph node  dissection was then  initiated. The superior vesicle artery was identified and the vesicouterine space developed. The obturator nerve was identified. Nodal tissue was removed within the boundaries of the left  genitofemoral nerve the left circumflex vein, the ureter and the superior vesicle artery. The nodal tissue was placed in an Endo Catch bag.  The pelvis was copiously irrigated and drained and hemostasis was assured. The vaginal cuff was closed with a running 0 Vicryl suture ligature. The needle was removed under direct visualization. The operative site is once again visualized and hemostasis was assured. The instruments were removed from the abdomen and pelvis and the port sites irrigated. The umbilical fascia was closed with an interrupted 0 Vicryl  suture. The subcutaneous tissue of the umbilical left upper quadrant and right lower quadrant subcutaneous tissues were approximated with a single suture. Skin incisions were closed with a subcuticular suture.  The vaginal vault was cleared with a moist sponge stick.  Sponge, lap and needle counts were correct x 3.    The patient had sequential compression devices and preoperative Lovenox for VTE prophylaxis and will receive Lovenox postoperatively.          Disposition: PACU - hemodynamically stable.         Condition:stable Foley draining clear urine.

## 2013-10-05 NOTE — Transfer of Care (Signed)
Immediate Anesthesia Transfer of Care Note  Patient: Kelsey Bruce  Procedure(s) Performed: Procedure(s): ROBOTIC ASSISTED TOTAL HYSTERECTOMY WITH BILATERAL SALPINGO OOPHORECTOMY WITH LYMPH NODE DISECTION (Bilateral)  Patient Location: PACU  Anesthesia Type:General  Level of Consciousness: awake, alert  and oriented  Airway & Oxygen Therapy: Patient Spontanous Breathing  Post-op Assessment: Report given to PACU RN and Post -op Vital signs reviewed and stable  Post vital signs: Reviewed and stable  Complications: No apparent anesthesia complications

## 2013-10-05 NOTE — Anesthesia Preprocedure Evaluation (Addendum)
Anesthesia Evaluation  Patient identified by MRN, date of birth, ID band Patient awake    Reviewed: Allergy & Precautions, H&P , NPO status , Patient's Chart, lab work & pertinent test results  Airway Mallampati: II TM Distance: >3 FB Neck ROM: Full    Dental no notable dental hx.    Pulmonary neg pulmonary ROS,  breath sounds clear to auscultation  Pulmonary exam normal       Cardiovascular negative cardio ROS  Rhythm:Regular Rate:Normal     Neuro/Psych  Neuromuscular disease (cerebral palsy) negative psych ROS   GI/Hepatic negative GI ROS, Neg liver ROS,   Endo/Other  negative endocrine ROS  Renal/GU negative Renal ROS  negative genitourinary   Musculoskeletal negative musculoskeletal ROS (+)   Abdominal   Peds negative pediatric ROS (+)  Hematology negative hematology ROS (+)   Anesthesia Other Findings   Reproductive/Obstetrics negative OB ROS                          Anesthesia Physical Anesthesia Plan  ASA: II  Anesthesia Plan: General   Post-op Pain Management:    Induction: Intravenous  Airway Management Planned: Oral ETT  Additional Equipment:   Intra-op Plan:   Post-operative Plan: Extubation in OR  Informed Consent: I have reviewed the patients History and Physical, chart, labs and discussed the procedure including the risks, benefits and alternatives for the proposed anesthesia with the patient or authorized representative who has indicated his/her understanding and acceptance.   Dental advisory given  Plan Discussed with: CRNA  Anesthesia Plan Comments:         Anesthesia Quick Evaluation

## 2013-10-05 NOTE — H&P (View-Only) (Signed)
. Consult Note: Gyn-Onc  Consult was requested by Dr. Hulan Fray for the evaluation of Kelsey Bruce 63 y.o. female  CC:  Chief Complaint  Patient presents with  . Uterine cancer    Assessment/Plan:  Ms. Kelsey Bruce  is a 63 y.o.  year old woman (P2) with grade 1 endometrial cancer. A detailed discussion was held with the patient and her family with regard to to her endometrial cancer diagnosis. We discussed the standard management options for uterine cancer which includes surgery followed possibly by adjuvant therapy depending on the results of surgery. The options for surgical management include a hysterectomy and removal of the tubes and ovaries possibly with removal of pelvic lymph nodes. A minimally invasive approach including a robotic hysterectomy or laparoscopic hysterectomy have benefits including shorter hospital stay, recovery time and better wound healing. The alternative approach is an open hysterectomy. The patient has been counseled about these surgical options and the risks of surgery in general including infection, bleeding, damage to surrounding structures (including bowel, bladder, ureters, nerves or vessels), and the postoperative risks of PE/ DVT, and lymphedema. I extensively reviewed the additional risks of robotic hysterectomy including possible need for conversion to open laparotomy. I discussed positioning during surgery of trendelenberg and risks of minor facial swelling and care we take in preoperative positioning. After counseling and consideration of her options, she desires to proceed with robotic hysterectomy bilateral salpingo-oophorectomy and lymph node dissection as indicated.   All of her questions were answered to her satisfaction.   She will be seen by anesthesia for preoperative clearance and discussion of postoperative pain management. She was given the opportunity to ask questions, which were answered to her satisfaction, and she is agreement with the above  mentioned plan of care.     HPI: Kelsey Bruce is a 63 year old woman who is seen in consultation at the request of Dr Hulan Fray for endometrial cancer (grade 1). She  has a  3 month history of postmenopausal bleeding. She presented to Dr Hulan Fray for this and an endometrial biopsy was performed on 6/15 which revealed grade 1 endometrial cancer.  Interval History: She continues to have very light spotting and brown discharge.  Current Meds:  Outpatient Encounter Prescriptions as of 09/17/2013  Medication Sig  . beta carotene w/minerals (OCUVITE) tablet Take 1 tablet by mouth daily.  . cetirizine (ZYRTEC) 10 MG tablet Take 10 mg by mouth daily.  . fluticasone (FLONASE) 50 MCG/ACT nasal spray Place into both nostrils daily.  Marland Kitchen gabapentin (NEURONTIN) 300 MG capsule Take 1 capsule (300 mg total) by mouth as needed.  . meloxicam (MOBIC) 15 MG tablet 1 tab po qd prn musculoskeletal pain  . Multiple Vitamin (MULTIVITAMIN) tablet Take 1 tablet by mouth daily.  . mupirocin ointment (BACTROBAN) 2 % Apply 1 application topically 3 (three) times daily.  Marland Kitchen HYDROcodone-acetaminophen (NORCO/VICODIN) 5-325 MG per tablet 1-2 tabs po q6h prn pain  . [DISCONTINUED] azithromycin (ZITHROMAX) 250 MG tablet   . [DISCONTINUED] misoprostol (CYTOTEC) 200 MCG tablet Take 3 pills by mouth the night before biopsy.    Allergy: No Known Allergies  Social Hx:   History   Social History  . Marital Status: Divorced    Spouse Name: N/A    Number of Children: N/A  . Years of Education: N/A   Occupational History  . Not on file.   Social History Main Topics  . Smoking status: Never Smoker   . Smokeless tobacco: Never Used  . Alcohol Use: Yes  Comment: occasional  . Drug Use: No  . Sexual Activity: No   Other Topics Concern  . Not on file   Social History Narrative   Divorced.  Two children.   Orig from [place], [place].   HS education.   Works at [employer] in [place].   No tobacco, occ alcohol, no drugs.   Has 3  brothers and they all smoked, as did her parents.    Past Surgical Hx:  Past Surgical History  Procedure Laterality Date  . Dilation and curettage of uterus    . Tonsillectomy    . Colonoscopy  2004 (Dr. Henrene Pastor)    poly; recall sent 2009 but pt apparently didn't respond.    Past Medical Hx:  Past Medical History  Diagnosis Date  . Arthritis     LB, HIPs, knees, hands  . Seasonal allergic rhinitis   . Morton's neuroma 2012    Dr. Roseanne Reno did injections in the past  . BPPV (benign paroxysmal positional vertigo)     Past Gynecological History:  LMP was in her 60's. She has ahistory of a tubal ligation.  No LMP recorded. Patient is postmenopausal.  Family Hx:  Family History  Problem Relation Age of Onset  . Heart disease Father   . Hypertension Father   . Cancer Father     lung  . Arthritis Father   . Alcohol abuse Father   . Heart disease Mother   . Cancer Mother     lung  . Deep vein thrombosis Mother   . Alcohol abuse Mother   . Heart disease Brother   . Hypertension Brother   . Heart disease Brother   . Hypertension Brother   . Heart disease Brother   . Hypertension Brother     Review of Systems:  Constitutional  Feels well,   ENT Normal appearing ears and nares bilaterally Skin/Breast  No rash, sores, jaundice, itching, dryness Cardiovascular  No chest pain, shortness of breath, or edema  Pulmonary  No cough or wheeze.  Gastro Intestinal  No nausea, vomitting, or diarrhoea. No bright red blood per rectum, no abdominal pain, change in bowel movement, or constipation.  Genito Urinary  No frequency, urgency, dysuria, + Postmenopausal bleeding/spotting Musculo Skeletal  No myalgia, arthralgia, joint swelling or pain  Neurologic  No weakness, numbness, change in gait,  Psychology  No depression, anxiety, insomnia.   Vitals:  Blood pressure 165/90, pulse 68, temperature 97.6 F (36.4 C), temperature source Oral, resp. rate 20, height 5' 0.25" (1.53 m),  weight 147 lb 14.4 oz (67.087 kg).  Physical Exam: WD in NAD Neck  Supple NROM, without any enlargements.  Lymph Node Survey No cervical supraclavicular or inguinal adenopathy Cardiovascular  Pulse normal rate, regularity and rhythm. S1 and S2 normal.  Lungs  Clear to auscultation bilateraly, without wheezes/crackles/rhonchi. Good air movement.  Skin  No rash/lesions/breakdown  Psychiatry  Alert and oriented to person, place, and time  Abdomen  Normoactive bowel sounds, abdomen soft, non-tender and obese without evidence of hernia. Lower abdominal incision from tubal ligation well healed. Back No CVA tenderness Genito Urinary  Vulva/vagina: Normal external female genitalia.   No lesions. No discharge or bleeding.  Bladder/urethra:  No lesions or masses, well supported bladder  Vagina: normal, slight relaxation  Cervix: Normal appearing, no lesions.  Uterus:  Small, mobile, no parametrial involvement or nodularity.  Adnexa: No masses. Rectal  Good tone, no masses no cul de sac nodularity.  Extremities  No bilateral cyanosis,  clubbing or edema.   @[facility] @ 09/17/2013, 12:22 PM

## 2013-10-05 NOTE — Transfer of Care (Signed)
Immediate Anesthesia Transfer of Care Note  Patient: Kelsey Bruce  Procedure(s) Performed: Procedure(s): ROBOTIC ASSISTED TOTAL HYSTERECTOMY WITH BILATERAL SALPINGO OOPHORECTOMY WITH LYMPH NODE DISECTION (Bilateral)  Patient Location: PACU  Anesthesia Type:General  Level of Consciousness: awake, oriented and patient cooperative  Airway & Oxygen Therapy: Patient Spontanous Breathing and Patient connected to face mask oxygen  Post-op Assessment: Report given to PACU RN, Post -op Vital signs reviewed and stable and Patient moving all extremities  Post vital signs: Reviewed and stable  Complications: No apparent anesthesia complications

## 2013-10-06 LAB — BASIC METABOLIC PANEL
ANION GAP: 11 (ref 5–15)
BUN: 9 mg/dL (ref 6–23)
CHLORIDE: 105 meq/L (ref 96–112)
CO2: 25 mEq/L (ref 19–32)
Calcium: 8.8 mg/dL (ref 8.4–10.5)
Creatinine, Ser: 0.7 mg/dL (ref 0.50–1.10)
GFR calc non Af Amer: >90 mL/min (ref >90)
Glucose, Bld: 133 mg/dL — ABNORMAL HIGH (ref 70–99)
Potassium: 4.2 mEq/L (ref 3.7–5.3)
SODIUM: 141 meq/L (ref 137–147)

## 2013-10-06 LAB — CBC
HEMATOCRIT: 36.8 % (ref 36.0–46.0)
HEMOGLOBIN: 12.5 g/dL (ref 12.0–15.0)
MCH: 29.4 pg (ref 26.0–34.0)
MCHC: 34 g/dL (ref 30.0–36.0)
MCV: 86.6 fL (ref 78.0–100.0)
Platelets: 254 10*3/uL (ref 150–400)
RBC: 4.25 MIL/uL (ref 3.87–5.11)
RDW: 12.3 % (ref 11.5–15.5)
WBC: 9.5 10*3/uL (ref 4.0–10.5)

## 2013-10-06 MED ORDER — OXYCODONE-ACETAMINOPHEN 5-325 MG PO TABS: 1.0000 | ORAL_TABLET | ORAL | Status: DC | PRN

## 2013-10-06 MED ORDER — ALUM & MAG HYDROXIDE-SIMETH 200-200-20 MG/5ML PO SUSP: 30.0000 mL | ORAL | Status: DC | PRN

## 2013-10-06 NOTE — Progress Notes (Signed)
Discharge instructions reviewed with patient utilizing teach back method. Patient being discharged to home  

## 2013-10-06 NOTE — Discharge Summary (Signed)
Physician Discharge Summary  Patient ID: Kelsey Bruce MRN: [id number] DOB/AGE: 12/09/1950 63 y.o.  Admit date: 10/05/2013 Discharge date: 10/06/2013  Admission Diagnoses: Active Problems:   Uterine cancer  Discharge Diagnoses:  Active Problems:   Uterine cancer   Discharged Condition: good  Hospital Course: On 10/05/2013, the patient underwent the following: Procedure(s): ROBOTIC ASSISTED TOTAL HYSTERECTOMY WITH BILATERAL SALPINGO OOPHORECTOMY WITH LYMPH NODE DISECTION.   The postoperative course was uneventful.  She was discharged to home on postoperative day 1 tolerating a regular diet.  Consults: None  Significant Diagnostic Studies: None  Treatments: surgery: see above  Discharge Exam: Blood pressure 97/54, pulse 63, temperature 97.8 F (36.6 C), temperature source Oral, resp. rate 18, height 5' (1.524 m), weight 66.225 kg (146 lb), SpO2 98.00%. General appearance: alert Resp: clear to auscultation bilaterally Cardio: regular rate and rhythm, S1, S2 normal, no murmur, click, rub or gallop GI: soft, non-tender; bowel sounds normal; no masses,  no organomegaly Extremities: extremities normal, atraumatic, no cyanosis or edema and Homans sign is negative, no sign of DVT Incision/Wound:  Incisions intact w/ecchymoses  Disposition: Home  Discharge Instructions   Call MD for:  extreme fatigue    Complete by:  As directed      Call MD for:  persistant dizziness or light-headedness    Complete by:  As directed      Call MD for:  persistant nausea and vomiting    Complete by:  As directed      Call MD for:  redness, tenderness, or signs of infection (pain, swelling, redness, odor or green/yellow discharge around incision site)    Complete by:  As directed      Call MD for:  severe uncontrolled pain    Complete by:  As directed      Call MD for:  temperature >100.4    Complete by:  As directed      Diet - low sodium heart healthy    Complete by:  As directed      Discharge wound care:    Complete by:  As directed   Keep clean and dry     Driving Restrictions    Complete by:  As directed   No driving for while taking narcotics     Increase activity slowly    Complete by:  As directed      Lifting restrictions    Complete by:  As directed   No lifting > 5 lbs for 6 weeks     May shower / Bathe    Complete by:  As directed   No tub baths for 6 weeks     May walk up steps    Complete by:  As directed      Sexual Activity Restrictions    Complete by:  As directed   No intercourse for 8 weeks            Medication List         beta carotene w/minerals tablet  Take 1 tablet by mouth daily.     cetirizine 10 MG tablet  Commonly known as:  ZYRTEC  Take 10 mg by mouth daily.     guaiFENesin-dextromethorphan 100-10 MG/5ML syrup  Commonly known as:  ROBITUSSIN DM  Take 10 mLs by mouth every 4 (four) hours as needed for cough.     ibuprofen 200 MG tablet  Commonly known as:  ADVIL,MOTRIN  Take 400 mg by mouth every 6 (six) hours as needed for mild  pain or moderate pain.     meloxicam 15 MG tablet  Commonly known as:  MOBIC  Take 15 mg by mouth daily.     multivitamin tablet  Take 1 tablet by mouth daily.     oxyCODONE-acetaminophen 5-325 MG per tablet  Commonly known as:  PERCOCET/ROXICET  Take 1-2 tablets by mouth every 4 (four) hours as needed for severe pain (moderate to severe pain (when tolerating fluids)).           Follow-up Information   Schedule an appointment as soon as possible for a visit with Donaciano Eva, MD.   Specialty:  Obstetrics and Gynecology   Contact information:   [place] [address] [phone]       Signed: Agnes Lawrence 10/06/2013, 9:15 AM

## 2013-10-06 NOTE — Progress Notes (Signed)
Utilization review completed.  

## 2013-10-06 NOTE — Discharge Instructions (Signed)
Robotic Assisted  Hysterectomy Care After Refer to this sheet in the next few weeks. These instructions provide you with information on caring for yourself after your procedure. Your caregiver may also give you more specific instructions. Your treatment has been planned according to current medical practices, but problems sometimes occur. Call your caregiver if you have any problems or questions after your procedure. HOME CARE INSTRUCTIONS Healing will take time. You may have discomfort, tenderness, swelling, and bruising at the surgical site for a couple of weeks. This is normal and will get better as time goes on.  Only take over-the-counter or prescription medicines for pain, discomfort, or fever as directed by your caregiver.   Do not take aspirin. It can cause bleeding.   Do not drive when taking pain medicine.   Follow your caregiver's advice regarding diet, exercise, lifting, driving, and general activities.   Resume your usual diet as directed and allowed.   Get plenty of rest and sleep.   Do not douche, use tampons, or have sexual intercourse for at least 6 weeks, or until your caregiver gives you permission.   Change your bandages (dressings) as directed by your caregiver.   Monitor your temperature and notify your caregiver of a fever.   Take showers instead of baths for 2 to 3 weeks.   Do not drink alcohol until your caregiver gives you permission.   If you develop constipation, you may take a mild laxative with your caregiver's permission. Bran foods may help with constipation problems. Drinking enough fluids to keep your urine clear or pale yellow may help as well.   Try to have someone home with you for 1 or 2 weeks to help around the house.   Keep all your follow-up appointments as directed by your caregiver.  SEEK MEDICIAL CARE IF:   You have swelling, redness, or increasing pain in the wound.   You have pus coming from the wound.   You notice a bad smell  coming from the wound or bandage (dressing).   You have swelling, redness, or pain from the intravenous (IV) site.   Your wound breaks open.   You feel dizzy or lightheaded.   You have pain or bleeding when you urinate.   You have persistent diarrhea.   You have persistent nausea and vomiting.   You have abnormal vaginal discharge.   You have a rash.   You have any type of abnormal reaction or develop an allergy to your medicine.   You have poor pain control with your prescribed medicine.  SEEK IMMEDIATE MEDICIAL CARE IF:   You have a fever.   You have severe abdominal pain.   You have chest pain.   You have shortness of breath.   You faint.   You have pain, swelling, or redness of your leg.   You have heavy vaginal bleeding with blood clots.  MAKE SURE YOU:  Understand these instructions.   Will watch your condition.   Will get help right away if you are not doing well or get worse.  Document Released: 02/28/2011 Document Reviewed: 02/25/2011 [facility] Patient Information 2012 [place]. 10/06/2013  Return to work: 3 - 6 weeks  Activity: 1. Be up and out of the bed during the day.  Take a nap if needed.  You may walk up steps but be careful and use the hand rail.  Stair climbing will tire you more than you think, you may need to stop part way and rest.  2. No lifting or straining for 6 weeks.  3. Do Not drive if you are taking narcotic pain medicine.  4. Shower daily.  Use soap and water on your incision and pat dry; don't rub.   5. No sexual activity and nothing in the vagina for 8 weeks.  Diet: 1. Low sodium Heart Healthy Diet is recommended.  2. It is safe to use a laxative if you have difficulty moving your bowels.   Wound Care: 1. Keep clean and dry.  Shower daily.  Reasons to call the Doctor:   Fever - Oral temperature greater than 100.4 degrees Fahrenheit  Foul-smelling vaginal discharge  Difficulty urinating  Nausea and  vomiting  Increased pain at the site of the incision that is unrelieved with pain medicine.  Difficulty breathing with or without chest pain  New calf pain especially if only on one side  Sudden, continuing increased vaginal bleeding with or without clots.    Contacts: For questions or concerns you should contact:  Dr. Lahoma Crocker at [phone]  Dr. Everitt Amber at [facility] [phone]

## 2013-10-08 ENCOUNTER — Encounter (HOSPITAL_COMMUNITY): Payer: Self-pay | Admitting: Gynecologic Oncology

## 2013-10-11 ENCOUNTER — Encounter: Payer: Self-pay | Admitting: Family Medicine

## 2013-10-11 ENCOUNTER — Telehealth: Payer: Self-pay | Admitting: Gynecologic Oncology

## 2013-10-11 NOTE — Telephone Encounter (Signed)
Called patient to discuss pathology results from surgery, which was performed on 10/05/2013.  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 Tumor size was 4 cm, there was outer half myometrial invasion, and lymphovascular space invasion was present. 19 pelvic lymph nodes were negative for metastatic disease. The adnexa were also negative for metastatic disease.  I am recommending adjuvant vaginal brachytherapy for high intermittent uterine risk factors to reduce the risk for a local recurrence. I briefly discussed what this would involve with the patient, but discussed that ultimate and optimal treatment plan would be created by the radiation oncologist. I discussed that we will facilitate referral to Dr. Sondra Come for this discussion.  The patient also reported to me that she is having constipation postoperatively. I recommend that she try milk of magnesia today and that if this does not work she should call our office to obtain a prescription for magnesium citrate. She is using Colace and MiraLAX presently. She also reported a patch of numbness at the top of by since surgery. 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 I discussed that this is usually a repercussions of the pelvic lymphadenectomy.  I will see Adamae back for postoperative visit in approximately 3 weeks. I do not recommends starting radiation therapy prior 6 weeks postoperatively in order to allow for vaginal cuff healing. Donaciano Eva, MD

## 2013-10-12 ENCOUNTER — Telehealth: Payer: Self-pay | Admitting: *Deleted

## 2013-10-12 DIAGNOSIS — C549 Malignant neoplasm of corpus uteri, unspecified: Secondary | ICD-10-CM

## 2013-10-12 NOTE — Telephone Encounter (Signed)
Referral to Radiation made

## 2013-10-12 NOTE — Telephone Encounter (Signed)
Notified pt of Appt with Dr. Sondra Come 8/26 @ 0930 pt verbalized understanding.

## 2013-10-25 ENCOUNTER — Telehealth: Payer: Self-pay | Admitting: *Deleted

## 2013-10-25 ENCOUNTER — Ambulatory Visit: Payer: BC Managed Care – PPO

## 2013-10-25 DIAGNOSIS — N390 Urinary tract infection, site not specified: Secondary | ICD-10-CM

## 2013-10-25 LAB — URINALYSIS, MICROSCOPIC - CHCC
Bilirubin (Urine): NEGATIVE
Glucose: NEGATIVE mg/dL
Ketones: NEGATIVE mg/dL
NITRITE: POSITIVE
PH: 6 (ref 4.6–8.0)
PROTEIN: 30 mg/dL
Specific Gravity, Urine: 1.02 (ref 1.003–1.035)
UROBILINOGEN UR: 0.2 mg/dL (ref 0.2–1)

## 2013-10-25 NOTE — Telephone Encounter (Signed)
Pt called with c/o urinary frequency and then can't go. Pt also reports " some bleeding, enough to hae to use a pad but not real bad" Requested pt come in give Urine specimen, continue to monitor bleeding and if she is going through pad through out the day due to bleeding becoming heavier to call. Pt verbalized understanding.

## 2013-10-27 ENCOUNTER — Telehealth: Payer: Self-pay | Admitting: *Deleted

## 2013-10-27 LAB — URINE CULTURE

## 2013-10-27 MED ORDER — CIPROFLOXACIN HCL 500 MG PO TABS: 500.0000 mg | ORAL_TABLET | Freq: Two times a day (BID) | ORAL | Status: DC

## 2013-10-27 NOTE — Telephone Encounter (Signed)
Per MD, called pt Rx for cipro into pt pharmacy, notified pt.

## 2013-10-27 NOTE — Telephone Encounter (Signed)
Message copied by GARNER, Aletha Halim on Wed Oct 27, 2013  1:27 PM ------      Message from: Everitt Amber C      Created: Wed Oct 27, 2013 12:54 PM       Stanton Kidney,      I'm not sure that Shadae has already received treatment for this UTI, but if not, can we phone in some cipro or macrobid (7 day course) for her?      Thanks      Terrence Dupont ------

## 2013-11-12 NOTE — Progress Notes (Signed)
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 Uterus +/- tubes/ovaries, neoplastic, with cervix - INVASIVE ENDOMETRIOID CARCINOMA, FIGO GRADE II, INVADING INTO OUTER HALF OF THE MYOMETRIUM WITH ANGIOLYMPHATIC INVASION PRESENT. - CERVIX: BENIGN SQUAMOUS MUCOSA AND ENDOCERVICAL MUCOSA, NO DYSPLASIA OR MALIGNANCY. - BILATERAL OVARIES AND FALLOPIAN TUBES: BENIGN OVARIAN TISSUE AND FALLOPIAN TUBAL TISSUE, NO EVIDENCE OF MALIGNANCY. - PLEASE SEE ONCOLOGY TEMPLATE FOR DETAILS. 2. Lymph nodes, regional resection, left pelvic - SIX LYMPH NODES, NEGATIVE FOR METASTATIC CARCINOMA (0/6). 3. Lymph nodes, regional resection, right pelvic - THIRTEEN LYMPH NODES, NEGATIVE FOR METASTATIC CARCINOMA (0/13).  Past/Anticipated interventions by Gyn/Onc surgery, if any: 10/05/13 Procedure: ROBOTIC ASSISTED TOTAL HYSTERECTOMY WITH BILATERAL SALPINGO OOPHORECTOMY WITH LYMPH NODE DISECTION;  Surgeon: Everitt Amber, MD;  Location: [facility];  Service: Gynecology;  Laterality: Bilateral  Past/Anticipated interventions by medical oncology, if any: none  Weight changes, if any: no  Bowel/Bladder complaints, if any: patient has constipation with painful bowel movements, takes colace and miralax, had small bm this morning.  Also has urinary frequency  Nausea/Vomiting, if any: no  Pain issues, if any:  Has arthritis pain in her hands and feet  SAFETY ISSUES:  Prior radiation? no  Pacemaker/ICD? no  Possible current pregnancy? no  Is the patient on methotrexate? no  Current Complaints / other details:  Patient has 2 children.  She is here with her daughter.  She is going to see Dr. Denman George tomorrow.

## 2013-11-16 ENCOUNTER — Encounter: Payer: Self-pay | Admitting: General Practice

## 2013-11-17 ENCOUNTER — Encounter: Payer: Self-pay | Admitting: Radiation Oncology

## 2013-11-17 ENCOUNTER — Ambulatory Visit
Admission: RE | Admit: 2013-11-17 | Discharge: 2013-11-17 | Disposition: A | Discharge status: Home / Self Care | Payer: BC Managed Care – PPO | Source: Ambulatory Visit | Attending: Radiation Oncology | Admitting: Radiation Oncology

## 2013-11-17 VITALS — BP 150/79 | HR 61 | Temp 97.7°F | Resp 12 | Ht 60.0 in | Wt 146.2 lb

## 2013-11-17 DIAGNOSIS — C541 Malignant neoplasm of endometrium: Secondary | ICD-10-CM

## 2013-11-17 DIAGNOSIS — K5900 Constipation, unspecified: Secondary | ICD-10-CM | POA: Insufficient documentation

## 2013-11-17 DIAGNOSIS — Z9079 Acquired absence of other genital organ(s): Secondary | ICD-10-CM | POA: Diagnosis not present

## 2013-11-17 DIAGNOSIS — C549 Malignant neoplasm of corpus uteri, unspecified: Secondary | ICD-10-CM | POA: Diagnosis not present

## 2013-11-17 DIAGNOSIS — Z510 Encounter for antineoplastic radiation therapy: Secondary | ICD-10-CM | POA: Insufficient documentation

## 2013-11-17 DIAGNOSIS — Z791 Long term (current) use of non-steroidal anti-inflammatories (NSAID): Secondary | ICD-10-CM | POA: Insufficient documentation

## 2013-11-17 DIAGNOSIS — M129 Arthropathy, unspecified: Secondary | ICD-10-CM | POA: Insufficient documentation

## 2013-11-17 DIAGNOSIS — Z90710 Acquired absence of both cervix and uterus: Secondary | ICD-10-CM | POA: Diagnosis not present

## 2013-11-17 DIAGNOSIS — J309 Allergic rhinitis, unspecified: Secondary | ICD-10-CM | POA: Insufficient documentation

## 2013-11-17 NOTE — Progress Notes (Signed)
Radiation Oncology         [phone] ________________________________  Initial outpatient Consultation  Name: Kelsey Bruce MRN: [id number]  Date: 11/17/2013  DOB: 1951/01/24  [id number] H, MD  Everitt Amber, MD   REFERRING PHYSICIAN: Everitt Amber, MD  DIAGNOSIS: Stage IB grade 2 endometrioid adenocarcinoma  HISTORY OF PRESENT ILLNESS::Kelsey Bruce is a 63 y.o. female who is seen out courtesy of Dr. Everitt Amber for an opinion concerning radiation therapy as part of management of patient's recently diagnosed stage IB endometrial cancer. Patient presented in February with intermittent vaginal bleeding. She sought out local medical care earlier this summer concerning this issue. Patient was seen by Dr. Hulan Fray endometrial biopsy revealed adenocarcinoma, FIGO grade 1. Patient was referred to Dr. Denman George. On July 14 the patient was taken to the operating room and underwent robotic total laparoscopic hysterectomy, bilateral salpingo-oophorectomy, bilateral pelvic lymph node dissection. Upon pathologic review the patient was found to have a invasive endometrioid carcinoma, FIGO grade 2 invading into the outer half of the myometrium. There was angiolymphatic invasion present within the specimen. No evidence of cervical involvement. 6 benign lymph nodes were recovered from the left pelvis. 13 benign lymph nodes were recovered from the right pelvis. Patient is done well since her surgery. Given the above pathologic findings i.e. deeply invasive and angiolymphatic invasion,  the patient is now seen in radiation oncology for consideration for postoperative treatments.  PREVIOUS RADIATION THERAPY: No  PAST MEDICAL HISTORY:  has a past medical history of Arthritis; Seasonal allergic rhinitis; Morton's neuroma (2012); BPPV (benign paroxysmal positional vertigo); Uterine cancer; Cerebral palsy; and Endometrial adenocarcinoma (09/2013).    PAST SURGICAL HISTORY: Past Surgical History  Procedure Laterality  Date  . Dilation and curettage of uterus    . Tonsillectomy    . Colonoscopy  2004 (Dr. Henrene Pastor)    poly; recall sent 2009 but pt apparently didn't respond.  . Tubal ligation    . Robotic assisted total hysterectomy with bilateral salpingo oopherectomy Bilateral 10/05/2013    Procedure: ROBOTIC ASSISTED TOTAL HYSTERECTOMY WITH BILATERAL SALPINGO OOPHORECTOMY WITH LYMPH NODE DISECTION;  Surgeon: Everitt Amber, MD;  Location: [facility];  Service: Gynecology;  Laterality: Bilateral;    FAMILY HISTORY: family history includes Alcohol abuse in her father and mother; Arthritis in her father; Cancer in her father and mother; Deep vein thrombosis in her mother; Heart disease in her brother, brother, brother, father, and mother; Hypertension in her brother, brother, brother, and father.  SOCIAL HISTORY:  reports that she has never smoked. She has never used smokeless tobacco. She reports that she does not drink alcohol or use illicit drugs.  ALLERGIES: Review of patient's allergies indicates no known allergies.  MEDICATIONS:  Current Outpatient Prescriptions  Medication Sig Dispense Refill  . beta carotene w/minerals (OCUVITE) tablet Take 1 tablet by mouth daily.      . cetirizine (ZYRTEC) 10 MG tablet Take 10 mg by mouth daily.      Marland Kitchen docusate sodium (COLACE) 100 MG capsule Take 100 mg by mouth 2 (two) times daily.      Marland Kitchen ibuprofen (ADVIL,MOTRIN) 200 MG tablet Take 400 mg by mouth every 6 (six) hours as needed for mild pain or moderate pain.      . meloxicam (MOBIC) 15 MG tablet Take 15 mg by mouth daily.      . Multiple Vitamin (MULTIVITAMIN) tablet Take 1 tablet by mouth daily.      . polyethylene glycol (MIRALAX / GLYCOLAX) packet Take 17  g by mouth daily.      Marland Kitchen oxyCODONE-acetaminophen (PERCOCET/ROXICET) 5-325 MG per tablet Take 1-2 tablets by mouth every 4 (four) hours as needed for severe pain (moderate to severe pain (when tolerating fluids)).  30 tablet  0   No current facility-administered  medications for this encounter.    REVIEW OF SYSTEMS:  A 15 point review of systems is documented in the electronic medical record. This was obtained by the nursing staff. However, I reviewed this with the patient to discuss relevant findings and make appropriate changes.  Other than intermittent mild vaginal bleeding the patient denied any other symptoms prior to her surgery. She does have chronic problems with constipation. She denies any urination problems since her surgery. She continues to have chronic problems with constipation and I've given her recommendations concerning this issue. She does have some mild chronic pain in the right buttocks area. She denies any problems with swelling in her lower extremities since her surgery.   PHYSICAL EXAM:  height is 5' (1.524 m) and weight is 146 lb 3.2 oz (66.316 kg). Her oral temperature is 97.7 F (36.5 C). Her blood pressure is 150/79 and her pulse is 61. Her respiration is 12.   BP 150/79  Pulse 61  Temp(Src) 97.7 F (36.5 C) (Oral)  Resp 12  Ht 5' (1.524 m)  Wt 146 lb 3.2 oz (66.316 kg)  BMI 28.55 kg/m2  General Appearance:    Alert, cooperative, no distress, appears stated age, accompanied by a daughter on valuation today   Head:    Normocephalic, without obvious abnormality, atraumatic  Eyes:    PERRL, conjunctiva/corneas clear, EOM's intact,      Ears:    Normal TM's and external ear canals, both ears  Nose:   Nares normal, septum midline, mucosa normal, no drainage    or sinus tenderness  Throat:   Lips, mucosa, and tongue normal; teeth and gums normal  Neck:   Supple, symmetrical, trachea midline, no adenopathy;    thyroid:  no enlargement/tenderness/nodules; no carotid   bruit or JVD  Back:     Symmetric, no curvature, ROM normal, no CVA tenderness  Lungs:     Clear to auscultation bilaterally, respirations unlabored  Chest Wall:    No tenderness or deformity   Heart:    Regular rate and rhythm, S1 and S2 normal, no murmur, rub    or gallop     Abdomen:     Soft, non-tender, bowel sounds active all four quadrants,    no masses, no organomegaly, well-healed laparoscopic scars   Genitalia:    deferred until simulation and planning day   Rectal:   deferred until simulation and planning day  Extremities:   Extremities normal, atraumatic, no cyanosis or edema  Pulses:   2+ and symmetric all extremities  Skin:   Skin color, texture, turgor normal, no rashes or lesions  Lymph nodes:   Cervical, supraclavicular, and axillary nodes normal  Neurologic:    normal strength, sensation and reflexes    throughout     ECOG = 0  0 - Asymptomatic (Fully active, able to carry on all predisease activities without restriction)   LABORATORY DATA:  Lab Results  Component Value Date   WBC 9.5 10/06/2013   HGB 12.5 10/06/2013   HCT 36.8 10/06/2013   MCV 86.6 10/06/2013   PLT 254 10/06/2013   NEUTROABS 3.7 09/28/2013   Lab Results  Component Value Date   NA 141 10/06/2013   K  4.2 10/06/2013   CL 105 10/06/2013   CO2 25 10/06/2013   GLUCOSE 133* 10/06/2013   CREATININE 0.70 10/06/2013   CALCIUM 8.8 10/06/2013      RADIOGRAPHY: No results found.    IMPRESSION:  Stage IB grade 2 endometrioid adenocarcinoma. The patient was found to have a deeply invasive tumor ( 80%). The tumor also showed angiolymphatic invasion. Patient would be considered at high intermediate risk for recurrence and I would agree with gynecologic oncology for recommendations for intracavitary brachytherapy treatments. I discussed the treatment course side effects and potential toxicities of brachytherapy with the patient and her daughter. Patient appears to understand and wishes to proceed with planned course of treatment.   PLAN:Simulation and planning one to 2 weeks from now. Patient is scheduled for pelvic exam by gynecologic oncology August 27 to ensure adequate healing of the vaginal cuff region. The patient will receive 5 intracavitary brachytherapy treatments  directed at the proximal vagina. She will be treated with iridium 192 as the high-dose-rate source.   I spent 60 minutes minutes face to face with the patient and more than 50% of that time was spent in counseling and/or coordination of care.   ------------------------------------------------  Blair Promise, PhD, MD

## 2013-11-17 NOTE — Progress Notes (Signed)
Please see the Nurse Progress Note in the MD Initial Consult Encounter for this patient. 

## 2013-11-18 ENCOUNTER — Encounter: Payer: Self-pay | Admitting: Gynecologic Oncology

## 2013-11-18 ENCOUNTER — Telehealth: Payer: Self-pay | Admitting: *Deleted

## 2013-11-18 ENCOUNTER — Ambulatory Visit: Payer: BC Managed Care – PPO | Attending: Gynecologic Oncology | Admitting: Gynecologic Oncology

## 2013-11-18 VITALS — BP 142/71 | HR 68 | Temp 98.1°F | Resp 18 | Ht 60.0 in | Wt 146.0 lb

## 2013-11-18 DIAGNOSIS — Z90710 Acquired absence of both cervix and uterus: Secondary | ICD-10-CM | POA: Diagnosis not present

## 2013-11-18 DIAGNOSIS — Z8542 Personal history of malignant neoplasm of other parts of uterus: Secondary | ICD-10-CM | POA: Diagnosis not present

## 2013-11-18 DIAGNOSIS — G809 Cerebral palsy, unspecified: Secondary | ICD-10-CM | POA: Diagnosis not present

## 2013-11-18 DIAGNOSIS — H8110 Benign paroxysmal vertigo, unspecified ear: Secondary | ICD-10-CM | POA: Diagnosis not present

## 2013-11-18 DIAGNOSIS — Z79899 Other long term (current) drug therapy: Secondary | ICD-10-CM | POA: Insufficient documentation

## 2013-11-18 DIAGNOSIS — C541 Malignant neoplasm of endometrium: Secondary | ICD-10-CM

## 2013-11-18 DIAGNOSIS — C549 Malignant neoplasm of corpus uteri, unspecified: Secondary | ICD-10-CM

## 2013-11-18 NOTE — Progress Notes (Signed)
HPI:  Kelsey Bruce is a 63 y.o. year old G2P2002 initially seen in consultation on 6/26 for endometrial cancer referred by Dr Hulan Fray.  She then underwent a robotic assisted total hysterectomy, BSO and pelvic lymphadenectomy on 1/95/09 without complications.  Her postoperative course was uncomplicated.  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 She was recommended to receive adjuvant vaginal brachytherapy to reduce the risk for local recurrence based on her high/intermediate risk factors. She met with Dr Sondra Come regarding this on 11/17/13.  She is seen today for a postoperative check and to discuss her pathology results and treatment plan.  Since discharge from the hospital, she is feeling well.  She has improving appetite, normal bowel and bladder function, and pain controlled with minimal PO medication. She has no other complaints today.  Pathology report 10/05/13:  1. Uterus +/- tubes/ovaries, neoplastic, with cervix - INVASIVE ENDOMETRIOID CARCINOMA, FIGO GRADE II, INVADING INTO OUTER HALF OF THE MYOMETRIUM WITH ANGIOLYMPHATIC INVASION PRESENT. - CERVIX: BENIGN SQUAMOUS MUCOSA AND ENDOCERVICAL MUCOSA, NO DYSPLASIA OR MALIGNANCY. - BILATERAL OVARIES AND FALLOPIAN TUBES: BENIGN OVARIAN TISSUE AND FALLOPIAN TUBAL TISSUE, NO EVIDENCE OF MALIGNANCY. - PLEASE SEE ONCOLOGY TEMPLATE FOR DETAILS. 2. Lymph nodes, regional resection, left pelvic - SIX LYMPH NODES, NEGATIVE FOR METASTATIC CARCINOMA (0/6). 3. Lymph nodes, regional resection, right pelvic - THIRTEEN LYMPH NODES, NEGATIVE FOR METASTATIC CARCINOMA (0/13). Microscopic Comment 1. ONCOLOGY TABLE-UTERUS, CARCINOMA Specimen: Uterus, cervix, bilateral ovaries and fallopian tubes. Procedure: Total hysterectomy and bilateral salpingo-oophorectomy. Lymph node sampling performed: Yes Specimen  integrity: Intact. Maximum tumor size: 4.0 cm, gross measurement. Histologic type: Invasive endometrioid carcinoma. Grade: FIGO Grade II Myometrial invasion: 1.2 cm where myometrium is 1.5 cm in thickness Cervical stromal involvement: No Extent of involvement of other organs: No Lymph - vascular invasion: Present. Peritoneal washings: Negative ([id number]) Lymph nodes: # examined 19 ; # positive 0 Pelvic lymph nodes: 0 involved of 19 lymph nodes. Para-aortic lymph nodes: N/A   Review of systems: Constitutional:  She has no weight gain or weight loss. She has no fever or chills. Eyes: No blurred vision Ears, Nose, Mouth, Throat: No dizziness, headaches or changes in hearing. No mouth sores. Cardiovascular: No chest pain, palpitations or edema. Respiratory:  No shortness of breath, wheezing or cough Gastrointestinal: She has normal bowel movements without diarrhea or constipation. She denies any nausea or vomiting. She denies blood in her stool or heart burn. Genitourinary:  She denies pelvic pain, pelvic pressure or changes in her urinary function. She has no hematuria, dysuria, or incontinence. She has no irregular vaginal bleeding or vaginal discharge Musculoskeletal: Denies muscle weakness or joint pains.  Skin:  She has no skin changes, rashes or itching Neurological:  Denies dizziness or headaches. No neuropathy, no numbness or tingling. Psychiatric:  She denies depression or anxiety. Hematologic/Lymphatic:   No easy bruising or bleeding  No Known Allergies Outpatient Encounter Prescriptions as of 11/18/2013  Medication Sig  . beta carotene w/minerals (OCUVITE) tablet Take 1 tablet by mouth daily.  . cetirizine (ZYRTEC) 10 MG tablet Take 10 mg by mouth daily.  Marland Kitchen docusate sodium (COLACE) 100 MG capsule Take 100 mg by mouth 2 (two) times daily.  Marland Kitchen ibuprofen (ADVIL,MOTRIN) 200 MG tablet Take 400 mg by mouth every 6 (six) hours as needed for mild pain or moderate pain.  . meloxicam  (MOBIC) 15 MG tablet Take 15 mg  by mouth daily.  . Multiple Vitamin (MULTIVITAMIN) tablet Take 1 tablet by mouth daily.  . polyethylene glycol (MIRALAX / GLYCOLAX) packet Take 17 g by mouth daily.  Marland Kitchen oxyCODONE-acetaminophen (PERCOCET/ROXICET) 5-325 MG per tablet Take 1-2 tablets by mouth every 4 (four) hours as needed for severe pain (moderate to severe pain (when tolerating fluids)).   Past Medical History  Diagnosis Date  . Arthritis     LB, HIPs, knees, hands  . Seasonal allergic rhinitis   . Morton's neuroma 2012    Dr. Roseanne Reno did injections in the past  . BPPV (benign paroxysmal positional vertigo)   . Uterine cancer   . Cerebral palsy   . Endometrial adenocarcinoma 09/2013    Dr. Denman George: pt to get vaginal brachytherapy via rad onc   Oncology History   63 year old woman with stage IB grade 2 endometriod endometrial adenocarcinoma with high/intermediate risk factors. Recommended to receive adjuvant vaginal brachytherapy after definitive staging to reduce the risk for recurrence.     Endometrial cancer, grade I   09/06/2013 Initial Diagnosis Endometrial cancer, grade I   10/05/2013 Surgery robotic assisted total hysterctomy, BSO, pelvic lymphadenectomy   Past Surgical History  Procedure Laterality Date  . Dilation and curettage of uterus    . Tonsillectomy    . Colonoscopy  2004 (Dr. Henrene Pastor)    poly; recall sent 2009 but pt apparently didn't respond.  . Tubal ligation    . Robotic assisted total hysterectomy with bilateral salpingo oopherectomy Bilateral 10/05/2013    Procedure: ROBOTIC ASSISTED TOTAL HYSTERECTOMY WITH BILATERAL SALPINGO OOPHORECTOMY WITH LYMPH NODE DISECTION;  Surgeon: Everitt Amber, MD;  Location: [facility];  Service: Gynecology;  Laterality: Bilateral;   Family History  Problem Relation Age of Onset  . Heart disease Father   . Hypertension Father   . Cancer Father     lung  . Arthritis Father   . Alcohol abuse Father   . Heart disease Mother   . Cancer Mother      lung  . Deep vein thrombosis Mother   . Alcohol abuse Mother   . Heart disease Brother   . Hypertension Brother   . Heart disease Brother   . Hypertension Brother   . Heart disease Brother   . Hypertension Brother    History   Social History  . Marital Status: Divorced    Spouse Name: N/A    Number of Children: 2  . Years of Education: N/A   Occupational History  . retired    Social History Main Topics  . Smoking status: Never Smoker   . Smokeless tobacco: Never Used  . Alcohol Use: No     Comment: occasional  . Drug Use: No  . Sexual Activity: No   Other Topics Concern  . None   Social History Narrative   Divorced.  Two children.   Orig from [place], [place].   HS education.   Works at [employer] in [place].   No tobacco, occ alcohol, no drugs.   Has 3 brothers and they all smoked, as did her parents.    Physical Exam: Blood pressure 142/71, pulse 68, temperature 98.1 F (36.7 C), temperature source Oral, resp. rate 18, height 5' (1.524 m), weight 146 lb (66.225 kg). General: Well dressed, well nourished in no apparent distress.   HEENT:  Normocephalic and atraumatic, no lesions.  Extraocular muscles intact. Sclerae anicteric. Pupils equal, round, reactive. No mouth sores or ulcers. Thyroid is normal size, not nodular, midline.  Skin:  No lesions or rashes. Breasts:  Soft, symmetric.  No skin or nipple changes.  No palpable LN or masses. Lungs:  Clear to auscultation bilaterally.  No wheezes. Cardiovascular:  Regular rate and rhythm.  No murmurs or rubs. Abdomen:  Soft, nontender, nondistended.  No palpable masses.  No hepatosplenomegaly.  No ascites. Normal bowel sounds.  No hernias.  Incisions are well healed. Genitourinary: Normal EGBUS  Vaginal cuff intact.  No bleeding or discharge.  No cul de sac fullness. Extremities: No cyanosis, clubbing or edema.  No calf tenderness or erythema. No palpable cords. Psychiatric: Mood and affect are  appropriate. Neurological: Awake, alert and oriented x 3. Sensation is intact, no neuropathy.  Musculoskeletal: No pain, normal strength and range of motion.  Assessment:    63 y.o. 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   S/p robotic hysterectomy, BSO, lymphadenectomy on 10/05/13. + LVSI, 80% myometrial invasion, negative pelvic washings and negative lymph nodes.   Plan: 1) Pathology reports reviewed today 2) Treatment counseling - high/intermediate risk for recurrence. Therefore we are recommending adjuvant brachytherapy. She has seen Dr Sondra Come and will likely commence this within the month. Her cuff has healed adequately to resume this. I discussed that while recurrence risk is reduced post radiation, it is not negligible, and therefore ,in accordance with SGO guidelines, we are recommending q 3 monthly followup for2 years after radiation. After that time surveillance visits can be spaced to 6 monthly. We reviewed symptoms concerning for recurrence (including vaginal bleeding, pelvic or abdominal pains, new lower extremity edema, cough, change in bladder or bowel habit, or weight loss) and the patient was informed to contact us if she notes these symptoms in between her scheduled surveillance visits.  3)  Return to clinic in 4 months. Donaciano Eva, MD

## 2013-11-18 NOTE — Telephone Encounter (Signed)
CALLED PATIENT TO INFORM OF [facility] APPTS., LVM FOR A RETURN CALL

## 2013-11-22 NOTE — Addendum Note (Signed)
Encounter addended by: Jacqulyn Liner, RN on: 11/22/2013 10:03 AM<BR>     Documentation filed: Charges VN

## 2013-11-28 ENCOUNTER — Emergency Department (INDEPENDENT_AMBULATORY_CARE_PROVIDER_SITE_OTHER): Payer: BC Managed Care – PPO

## 2013-11-28 ENCOUNTER — Inpatient Hospital Stay (HOSPITAL_BASED_OUTPATIENT_CLINIC_OR_DEPARTMENT_OTHER): Admit: 2013-11-28 | Payer: BC Managed Care – PPO

## 2013-11-28 ENCOUNTER — Emergency Department
Admission: EM | Admit: 2013-11-28 | Discharge: 2013-11-28 | Disposition: A | Discharge status: Home / Self Care | Payer: BC Managed Care – PPO | Source: Home / Self Care | Attending: Emergency Medicine | Admitting: Emergency Medicine

## 2013-11-28 ENCOUNTER — Encounter: Payer: Self-pay | Admitting: Emergency Medicine

## 2013-11-28 DIAGNOSIS — M25569 Pain in unspecified knee: Secondary | ICD-10-CM

## 2013-11-28 DIAGNOSIS — R52 Pain, unspecified: Secondary | ICD-10-CM

## 2013-11-28 DIAGNOSIS — M25562 Pain in left knee: Secondary | ICD-10-CM

## 2013-11-28 NOTE — ED Notes (Signed)
Pt fell going up a step bruising R and L knees.  L knee worse than R.  Has discomfort behind L knee.  Had hysterectomy in July and is scheduled for radiation Tx to begin on Sept. 24.

## 2013-11-28 NOTE — Discharge Instructions (Signed)

## 2013-11-28 NOTE — ED Provider Notes (Signed)
CSN: [id number]     Arrival date & time 11/28/13  1114 History   First MD Initiated Contact with Patient 11/28/13 1143     Chief Complaint  Patient presents with  . Knee Injury   (Consider location/radiation/quality/duration/timing/severity/associated sxs/prior Treatment) Patient is a 63 y.o. female presenting with leg pain. The history is provided by the patient. No language interpreter was used.  Leg Pain Location:  Knee Time since incident:  5 days Injury: yes   Mechanism of injury: fall   Fall:    Fall occurred:  Walking   Entrapped after fall: no   Knee location:  L knee Pain details:    Quality:  Aching   Radiates to:  L leg   Severity:  Mild   Onset quality:  Gradual Chronicity:  New Dislocation: no   Foreign body present:  No foreign bodies Relieved by:  Nothing Worsened by:  Exercise Ineffective treatments:  None tried Risk factors: recent illness   Pt had hysterectomy in July for endometrial cancer.   Pt had a fall 5 days ago and hit both knees.  Pt reports right was painful when she fell but left has become painful behind knee and leg.   Pt expresses concern for dvt.   Past Medical History  Diagnosis Date  . Arthritis     LB, HIPs, knees, hands  . Seasonal allergic rhinitis   . Morton's neuroma 2012    Dr. Roseanne Reno did injections in the past  . BPPV (benign paroxysmal positional vertigo)   . Uterine cancer   . Cerebral palsy   . Endometrial adenocarcinoma 09/2013    Dr. Denman George: pt to get vaginal brachytherapy via rad onc   Past Surgical History  Procedure Laterality Date  . Dilation and curettage of uterus    . Tonsillectomy    . Colonoscopy  2004 (Dr. Henrene Pastor)    poly; recall sent 2009 but pt apparently didn't respond.  . Tubal ligation    . Robotic assisted total hysterectomy with bilateral salpingo oopherectomy Bilateral 10/05/2013    Procedure: ROBOTIC ASSISTED TOTAL HYSTERECTOMY WITH BILATERAL SALPINGO OOPHORECTOMY WITH LYMPH NODE DISECTION;  Surgeon: Everitt Amber, MD;  Location: [facility];  Service: Gynecology;  Laterality: Bilateral;   Family History  Problem Relation Age of Onset  . Heart disease Father   . Hypertension Father   . Cancer Father     lung  . Arthritis Father   . Alcohol abuse Father   . Heart disease Mother   . Cancer Mother     lung  . Deep vein thrombosis Mother   . Alcohol abuse Mother   . Heart disease Brother   . Hypertension Brother   . Heart disease Brother   . Hypertension Brother   . Heart disease Brother   . Hypertension Brother    History  Substance Use Topics  . Smoking status: Never Smoker   . Smokeless tobacco: Never Used  . Alcohol Use: No     Comment: occasional   OB History   Grav Para Term Preterm Abortions TAB SAB Ect Mult Living   2 2 2       2      Review of Systems  All other systems reviewed and are negative.   Allergies  Review of patient's allergies indicates no known allergies.  Home Medications   Prior to Admission medications   Medication Sig Start Date End Date Taking? Authorizing Provider  beta carotene w/minerals (OCUVITE) tablet Take 1 tablet by  mouth daily.    Historical Provider, MD  cetirizine (ZYRTEC) 10 MG tablet Take 10 mg by mouth daily.    Historical Provider, MD  docusate sodium (COLACE) 100 MG capsule Take 100 mg by mouth 2 (two) times daily.    Historical Provider, MD  ibuprofen (ADVIL,MOTRIN) 200 MG tablet Take 400 mg by mouth every 6 (six) hours as needed for mild pain or moderate pain.    Historical Provider, MD  meloxicam (MOBIC) 15 MG tablet Take 15 mg by mouth daily.    Historical Provider, MD  Multiple Vitamin (MULTIVITAMIN) tablet Take 1 tablet by mouth daily.    Historical Provider, MD  oxyCODONE-acetaminophen (PERCOCET/ROXICET) 5-325 MG per tablet Take 1-2 tablets by mouth every 4 (four) hours as needed for severe pain (moderate to severe pain (when tolerating fluids)). 10/06/13   Lahoma Crocker, MD  polyethylene glycol [facility] Floria Raveling) packet  Take 17 g by mouth daily.    Historical Provider, MD   BP 133/82  Pulse 67  Temp(Src) 97.7 F (36.5 C) (Oral)  Ht 5' (1.524 m)  Wt 146 lb (66.225 kg)  BMI 28.51 kg/m2  SpO2 96% Physical Exam  Nursing note and vitals reviewed. Constitutional: She is oriented to person, place, and time. She appears well-developed and well-nourished.  HENT:  Head: Normocephalic.  Eyes: EOM are normal.  Neck: Normal range of motion.  Pulmonary/Chest: Effort normal.  Musculoskeletal: She exhibits tenderness.  non tender to palpation,  Negative homan's   (pt feels pain in back of knee and leg with standing.  No swelling or redness.    Neurological: She is alert and oriented to person, place, and time.  Skin: Skin is warm.  Psychiatric: She has a normal mood and affect.    ED Course  Procedures (including critical care time) Labs Review Labs Reviewed - No data to display  Imaging Review Dg Knee Complete 4 Views Left  11/28/2013   CLINICAL DATA:  Left knee pain following fall 5 days ago  EXAM: LEFT KNEE - COMPLETE 4+ VIEW  COMPARISON:  None.  FINDINGS: No fracture or dislocation. There is mild tricompartmental degenerative change of the knee, worse within the medial compartment with joint space loss, subchondral sclerosis and osteophytosis. There is no evidence of chondrocalcinosis. There is minimal spurring of the tibial spines. No joint effusion. Regional soft tissues appear normal.  IMPRESSION: 1. No acute findings. 2. Mild degenerative change of the knee, worse within the medial compartment.   Electronically Signed   By: Sandi Mariscal M.D.   On: 11/28/2013 12:24     MDM   Pt counseled on dvt.   I am able to get ultrasound at [facility] center today at 1:30.      1. Knee pain, left   2. Pain     Pt declined ultrasound at [facility].   Pt reports she wants to wait to see if leg gets better.  Pt will discuss with her oncologist on Wednesday.     [place], PA-C 11/28/13 1317

## 2013-12-01 ENCOUNTER — Telehealth: Payer: Self-pay | Admitting: *Deleted

## 2013-12-03 NOTE — ED Provider Notes (Signed)
Medical history/examination/treatment/procedure(s) were performed by non-physician provider and as supervising physician I was immediately available for consultation/collaboration.  Jacqulyn Cane, MD 12/03/13 [phone]

## 2013-12-10 ENCOUNTER — Ambulatory Visit (INDEPENDENT_AMBULATORY_CARE_PROVIDER_SITE_OTHER): Payer: BC Managed Care – PPO | Admitting: Family Medicine

## 2013-12-10 ENCOUNTER — Encounter: Payer: Self-pay | Admitting: Family Medicine

## 2013-12-10 VITALS — BP 140/79 | HR 57 | Temp 97.9°F | Resp 18 | Ht 60.0 in | Wt 148.0 lb

## 2013-12-10 DIAGNOSIS — M25462 Effusion, left knee: Secondary | ICD-10-CM

## 2013-12-10 DIAGNOSIS — S86912A Strain of unspecified muscle(s) and tendon(s) at lower leg level, left leg, initial encounter: Secondary | ICD-10-CM

## 2013-12-10 DIAGNOSIS — M1710 Unilateral primary osteoarthritis, unspecified knee: Secondary | ICD-10-CM

## 2013-12-10 DIAGNOSIS — M1712 Unilateral primary osteoarthritis, left knee: Secondary | ICD-10-CM

## 2013-12-10 DIAGNOSIS — M25469 Effusion, unspecified knee: Secondary | ICD-10-CM

## 2013-12-10 DIAGNOSIS — IMO0002 Reserved for concepts with insufficient information to code with codable children: Secondary | ICD-10-CM

## 2013-12-10 MED ORDER — HYDROCODONE-ACETAMINOPHEN 5-325 MG PO TABS: 1.0000 | ORAL_TABLET | Freq: Four times a day (QID) | ORAL | Status: DC | PRN

## 2013-12-10 MED ORDER — METHYLPREDNISOLONE ACETATE 40 MG/ML IJ SUSP
40.0000 mg | Freq: Once | INTRAMUSCULAR | Status: AC
  Administered 2013-12-10: 40 mg via INTRA_ARTICULAR

## 2013-12-10 NOTE — Progress Notes (Signed)
Pre visit review using our clinic review tool, if applicable. No additional management support is needed unless otherwise documented below in the visit note. 

## 2013-12-10 NOTE — Progress Notes (Signed)
OFFICE NOTE  12/10/2013  CC:  Chief Complaint  Patient presents with  . Knee Pain    left knee   HPI: Patient is a 63 y.o. Caucasian female who is here for pain in left knee.   Fell on 11/23/13 onto right knee but thinks she twisted left knee b/c it has been hurting but it didn't start hurting until 3 days after she fell.  Hurts all over the knee cap and in back of knee, worse with walking and going up/down steps.   She went to [facility] on 11/28/13 and was dx'd with contusion and arthritis, no meds rx'd. X-rays that day showed no acute findings and mild deg changes, worse in medial compartment.  Taking NSAIDs 1-2 times per day lately.  Pertinent PMH:  Past medical, surgical, social, and family history reviewed and no changes are noted since last office visit.  MEDS:  Outpatient Prescriptions Prior to Visit  Medication Sig Dispense Refill  . beta carotene w/minerals (OCUVITE) tablet Take 1 tablet by mouth daily.      . cetirizine (ZYRTEC) 10 MG tablet Take 10 mg by mouth daily.      Marland Kitchen docusate sodium (COLACE) 100 MG capsule Take 100 mg by mouth 2 (two) times daily.      Marland Kitchen ibuprofen (ADVIL,MOTRIN) 200 MG tablet Take 400 mg by mouth every 6 (six) hours as needed for mild pain or moderate pain.      . meloxicam (MOBIC) 15 MG tablet Take 15 mg by mouth daily.      . Multiple Vitamin (MULTIVITAMIN) tablet Take 1 tablet by mouth daily.      Marland Kitchen oxyCODONE-acetaminophen (PERCOCET/ROXICET) 5-325 MG per tablet Take 1-2 tablets by mouth every 4 (four) hours as needed for severe pain (moderate to severe pain (when tolerating fluids)).  30 tablet  0  . polyethylene glycol (MIRALAX / GLYCOLAX) packet Take 17 g by mouth daily.       No facility-administered medications prior to visit.    PE: Blood pressure 140/79, pulse 57, temperature 97.9 F (36.6 C), temperature source Oral, resp. rate 18, height 5' (1.524 m), weight 148 lb (67.132 kg), SpO2 95.00%. Gen: Alert, well appearing.  Patient is oriented to  person, place, time, and situation. Left knee with mild warmth, slight effusion. ROM: extends fully but signif pain at full exten Flexion limited to 110 deg. Patellar grind uncomfortable. She has lateral >medial joint line TTP, +mcmurray's on left (pain but no pop felt, no click).  Lachman's neg Varus/valgus stress ok/intact collateral ligaments. No LE edema.  NO popliteal swelling, tenderness, or mass.  IMPRESSION AND PLAN:  1) Left knee pain, with effusion. Suspect strain complicating her underlying deg arthritis. Could have mild meniscal tear. Pt really would like to to conservative measures as much as possible, doesn't want imaging like MRI and doesn't want to see an orthopedist. Will give steroid injection today:  Procedure: Therapeutic knee injection.  The patient's clinical condition is marked by substantial pain and/or significant functional disability.  Other conservative therapy has not provided relief, is contraindicated, or not appropriate.  There is a reasonable likelihood that injection will significantly improve the patient's pain and/or functional disability. Cleaned skin with alcohol swab, used anterior approach to enter knee joint, Injected 1  ml of depo medrol 40mg /ml + 2 ml of 1% lidocaine w/out epi into joint space without resistance.  No immediate complications.  Patient tolerated procedure well.  Post-injection care discussed, including 20 min of icing 1-2 times in  the next 4-8 hours, frequent non weight-bearing ROM exercises over the next few days, and general pain medication management.  Gave vicodin 5/325, 1-2 tabs q6h prn pain, #30,no RF.  An After Visit Summary was printed and given to the patient.  FOLLOW UP: prn

## 2013-12-13 NOTE — Progress Notes (Signed)
GYN Location of Tumor / Histology:stage IB grade 2 endometrial rate endometrial adenocarcinoma   Kelsey Bruce presented with a three month history of postmenopausal bleeding   Biopsies from 10/05/13 revealed:  Diagnosis  1. Uterus +/- tubes/ovaries, neoplastic, with cervix  - INVASIVE ENDOMETRIOID CARCINOMA, FIGO GRADE II, INVADING INTO OUTER HALF OF THE  MYOMETRIUM WITH ANGIOLYMPHATIC INVASION PRESENT.  - CERVIX: BENIGN SQUAMOUS MUCOSA AND ENDOCERVICAL MUCOSA, NO DYSPLASIA OR  MALIGNANCY.  - BILATERAL OVARIES AND FALLOPIAN TUBES: BENIGN OVARIAN TISSUE AND FALLOPIAN  TUBAL TISSUE, NO EVIDENCE OF MALIGNANCY.  - PLEASE SEE ONCOLOGY TEMPLATE FOR DETAILS.  2. Lymph nodes, regional resection, left pelvic  - SIX LYMPH NODES, NEGATIVE FOR METASTATIC CARCINOMA (0/6).  3. Lymph nodes, regional resection, right pelvic  - THIRTEEN LYMPH NODES, NEGATIVE FOR METASTATIC CARCINOMA (0/13).   Past/Anticipated interventions by Gyn/Onc surgery, if any: 10/05/13 Procedure: ROBOTIC ASSISTED TOTAL HYSTERECTOMY WITH BILATERAL SALPINGO OOPHORECTOMY WITH LYMPH NODE DISECTION; Surgeon: Everitt Amber, MD; Location: [facility]; Service: Gynecology; Laterality: Bilateral   Past/Anticipated interventions by medical oncology, if any: none   Weight changes, if any: no   Bowel/Bladder complaints, if any: patient has constipation with painful bowel movements, takes colace and miralax, had small bm this morning. Also has urinary frequency   Nausea/Vomiting, if any: no   Pain issues, if any: Has arthritis pain in her hands and feet   SAFETY ISSUES:  Prior radiation? no  Pacemaker/ICD? no  Possible current pregnancy? no  Is the patient on methotrexate? no  Current Complaints / other details: Patient has 2 children.

## 2013-12-15 ENCOUNTER — Telehealth: Payer: Self-pay | Admitting: *Deleted

## 2013-12-15 NOTE — Telephone Encounter (Signed)
CALLED PATIENT TO REMIND OF HDR CASE FOR 12-16-13 - ARRIVAL TIME - 12 PM, SPOKE WITH PATIENT AND SHE IS AWARE OF THESE APPTS.

## 2013-12-16 ENCOUNTER — Encounter: Payer: Self-pay | Admitting: Radiation Oncology

## 2013-12-16 ENCOUNTER — Ambulatory Visit
Admission: RE | Admit: 2013-12-16 | Discharge: 2013-12-16 | Disposition: A | Discharge status: Home / Self Care | Payer: BC Managed Care – PPO | Source: Ambulatory Visit | Attending: Radiation Oncology | Admitting: Radiation Oncology

## 2013-12-16 VITALS — BP 136/76 | HR 77 | Temp 97.5°F | Ht 60.0 in | Wt 145.8 lb

## 2013-12-16 DIAGNOSIS — C541 Malignant neoplasm of endometrium: Secondary | ICD-10-CM

## 2013-12-16 DIAGNOSIS — Z510 Encounter for antineoplastic radiation therapy: Secondary | ICD-10-CM | POA: Diagnosis not present

## 2013-12-16 NOTE — Progress Notes (Signed)
Ms. Soltau here today for fitting of HDR Device prior to simulation.  Denies any pain, nor vaginal bleeding today.  Recent fall 0n 11/23/13 and injured her left knee.  No fracture, but continues to have pain.  Oxycodone prn.

## 2013-12-16 NOTE — Progress Notes (Signed)
  Radiation Oncology         [phone]) [phone] ________________________________  Name: Kelsey Bruce MRN: [id number]  Date: 12/16/2013  DOB: 09-03-1950  High-dose-rate brachytherapy procedure Note  CC: Tammi Sou, MD  Everitt Amber, MD  Diagnosis:  Stage IB grade 2 endometrioid adenocarcinoma   Narrative: The patient was placed on the high-dose-rate treatment table. The patient's custom vaginal cylinder was placed in the proximal vagina. This was affixed to the CT/MR stabilization plate to prevent slippage. A fiducial Elta Guadeloupe was placed within the vaginal cylinder.  Verification simulation  The patient had a AP and lateral film obtained with the vaginal cylinder and fiducial markers in place. This documented accurate position of the vaginal cylinder for treatment.  High-dose-rate brachytherapy treatment  The remote afterloading device was affixed to the vaginal cylinder by catheter system. Patient then proceeded to undergo her first high-dose-rate treatment directed at the proximal vagina. The patient was prescribed a dose of 6 Gy to the mucosal surface. This was achieved with 1 channel using 8 dwell positions. Iridium 192 was the high-dose-rate source. The patient was treated with a 3 cm diameter cylinder, treatment length was 4 cm. Treatment time was 436 seconds. Patient tolerated the procedure well. After completion of her therapy a radiation survey was performed documenting return of the iridium source into the gamma med safe.    ____________________________________ Blair Promise, MD

## 2013-12-16 NOTE — Progress Notes (Signed)
  Radiation Oncology         [phone] ________________________________  Name: Kelsey Bruce MRN: [id number]  Date: 12/16/2013  DOB: 01/27/1951  Simple treatment device Note  The patient had construction of her custom vaginal cylinder for treatment. Patient will be treated with a 3 cm non- segmented cylinder.  ____________________________________ Blair Promise, MD

## 2013-12-16 NOTE — Progress Notes (Signed)
  Radiation Oncology         [phone] ________________________________  Name: [name] MRN: [id number]  Date: 12/16/2013  DOB: December 28, 1950  Vaginal brachytherapy procedure Note  CC: Tammi Sou, MD  Everitt Amber, MD  Diagnosis: Stage IB grade 2 endometrioid adenocarcinoma    Narrative:  The patient returns today for planning and her first high-dose-rate treatment directed at the proximal vagina. The patient has done well since her initial consultation 11/17/2013. Patient did see Dr. Denman George and pelvic exam revealed the vaginal cuff to be intact.  height is 5' (1.524 m) and weight is 145 lb 12.8 oz (66.134 kg). Her temperature is 97.5 F (36.4 C). Her blood pressure is 136/76 and her pulse is 77.       The patient was taken to the nursing suite and placed in the dorsolithotomy position. A pelvic exam was performed revealing no palpable masses. The vaginal cuff is intact. A speculum examination there are no mucosal lesions noted. Patient she did undergo fitting for her vaginal cylinder. Optimal diameter to distend the vaginal vault without undue discomfort was a 3 cm cylinder. Patient tolerated the procedure well. She will be transferred to the CT simulation suite for planning.                              ____________________________________ Blair Promise, MD

## 2013-12-16 NOTE — Progress Notes (Signed)
  Radiation Oncology         [phone] ________________________________  Name: Kelsey Bruce MRN: [id number]  Date: 12/16/2013  DOB: 1950-08-13  SIMULATION AND TREATMENT PLANNING NOTE  DIAGNOSIS: Stage IB grade 2 endometrioid adenocarcinoma   NARRATIVE:  The patient was brought to the [place] suite.  Identity was confirmed.  All relevant records and images related to the planned course of therapy were reviewed.  The patient freely provided informed written consent to proceed with treatment after reviewing the details related to the planned course of therapy. The consent form was witnessed and verified by the simulation staff.  Then, the patient was set-up in a stable reproducible  supine position for radiation therapy. The patient's vaginal cylinder was reinserted. This was affixed to the CTs/MR stabilization plate to prevent slippage   CT images were obtained.  Surface markings were placed.  The CT images were loaded into the planning software.  Then the target and avoidance structures were contoured.  Treatment planning then occurred.  The radiation prescription was entered and confirmed.  I have requested : This treatment constitutes a Special Treatment Procedure for the following reason: [ High dose per fraction requiring special monitoring for increased toxicities of treatment including daily imaging (high dose rate bracytherapy)..  I have ordered:dose calc.  PLAN:  The patient will receive 30 Gy in 5 fractions directed at the proximal vagina. Treatment length will be 4 cm. The patient will be treated with a 3 cm diameter cylinder. Iridium 192 will be the high-dose-rate source. The patient will receive 6 Gy to the vaginal mucosal surface with each treatment. ________________________________   -----------------------------------  Blair Promise, PhD, MD

## 2013-12-21 ENCOUNTER — Telehealth: Payer: Self-pay | Admitting: *Deleted

## 2013-12-21 NOTE — Telephone Encounter (Signed)
CALLED PATIENT TO REMIND OF HDR TX. FOR 12-22-13, SPOKE WITH PATIENT AND SHE WILL BE HERE.

## 2013-12-22 ENCOUNTER — Ambulatory Visit
Admission: RE | Admit: 2013-12-22 | Discharge: 2013-12-22 | Disposition: A | Discharge status: Home / Self Care | Payer: BC Managed Care – PPO | Source: Ambulatory Visit | Attending: Radiation Oncology | Admitting: Radiation Oncology

## 2013-12-22 DIAGNOSIS — Z510 Encounter for antineoplastic radiation therapy: Secondary | ICD-10-CM | POA: Diagnosis not present

## 2013-12-22 DIAGNOSIS — C541 Malignant neoplasm of endometrium: Secondary | ICD-10-CM

## 2013-12-23 ENCOUNTER — Encounter: Payer: Self-pay | Admitting: Radiation Oncology

## 2013-12-23 NOTE — Progress Notes (Signed)
Editor: Blair Promise, MD (Physician)       Radiation Oncology [phone]  ________________________________  Name: KENLYNN HOUDE MRN: [id number]  Date: 12/22/2013 DOB: 1951/03/06    Vaginal brachytherapy procedure Note  CC: Tammi Sou, MD Everitt Amber, MD  Diagnosis: Stage IB grade 2 endometrioid adenocarcinoma   Narrative: The patient returns today for  her second high-dose-rate treatment directed at the proximal vagina. The patient has done well since her initial consultation 11/17/2013.  height is 5' (1.524 m) and weight is 145 lb 12.8 oz (66.134 kg). Her temperature is 97.5 F (36.4 C). Her blood pressure is 136/76 and her pulse is 77.  The patient was taken to the HDR suite and placed in the dorsolithotomy position. A pelvic exam was performed revealing no palpable masses. The vaginal cuff is intact. A speculum examination there are no mucosal lesions noted. Patient she did undergo placement of her vaginal cylinder. Optimal diameter to distend the vaginal vault without undue discomfort was a 3 cm cylinder. Patient tolerated the procedure well. ____________________________________  Blair Promise, MD

## 2013-12-23 NOTE — Progress Notes (Signed)
Blair Promise, MD (Physician)       Radiation Oncology [phone]  ________________________________  Name: Kelsey Bruce MRN: [id number]  Date: 12/22/2013 DOB: 22-Oct-1950  High-dose-rate brachytherapy procedure Note   CC: Tammi Sou, MD Everitt Amber, MD   Diagnosis: Stage IB grade 2 endometrioid adenocarcinoma   Narrative: The patient was placed on the high-dose-rate treatment table. The patient's custom vaginal cylinder was placed in the proximal vagina. This was affixed to the CT/MR stabilization plate to prevent slippage. A fiducial Elta Guadeloupe was placed within the vaginal cylinder.   Verification simulation   The patient had a AP and lateral film obtained with the vaginal cylinder and fiducial markers in place. This documented accurate position of the vaginal cylinder for treatment.   High-dose-rate brachytherapy treatment   The remote afterloading device was affixed to the vaginal cylinder by catheter system. Patient then proceeded to undergo her second high-dose-rate treatment directed at the proximal vagina. The patient was prescribed a dose of 6 Gy to the mucosal surface. This was achieved with 1 channel using 8 dwell positions. Iridium 192 was the high-dose-rate source. The patient was treated with a 3 cm diameter cylinder, treatment length was 4 cm. Treatment time was 188.4 seconds. Please note that the treatment time was shorter than her first treatment but our high-dose-rate iridium 192 source was exchanged for a more active source between the first and second treatment. Patient tolerated the procedure well. After completion of her therapy a radiation survey was performed documenting return of the iridium source into the gamma med safe.  ____________________________________  Blair Promise, MD

## 2013-12-23 NOTE — Progress Notes (Signed)
  Radiation Oncology [phone]  ________________________________  Name: Kelsey Bruce MRN: [id number]  Date: 12/22/2013 DOB: 11-02-1950    Simple treatment device Note  The patient had construction of her custom vaginal cylinder for treatment. Patient will be treated with a 3 cm non- segmented cylinder.  ____________________________________  Blair Promise, MD

## 2013-12-24 ENCOUNTER — Ambulatory Visit (INDEPENDENT_AMBULATORY_CARE_PROVIDER_SITE_OTHER): Payer: BC Managed Care – PPO | Admitting: Family Medicine

## 2013-12-24 ENCOUNTER — Telehealth: Payer: Self-pay | Admitting: Family Medicine

## 2013-12-24 ENCOUNTER — Encounter: Payer: Self-pay | Admitting: Family Medicine

## 2013-12-24 VITALS — BP 127/86 | HR 79 | Temp 98.7°F | Resp 18 | Ht 60.0 in | Wt 140.0 lb

## 2013-12-24 DIAGNOSIS — M222X2 Patellofemoral disorders, left knee: Secondary | ICD-10-CM

## 2013-12-24 DIAGNOSIS — S86912A Strain of unspecified muscle(s) and tendon(s) at lower leg level, left leg, initial encounter: Secondary | ICD-10-CM | POA: Insufficient documentation

## 2013-12-24 DIAGNOSIS — M25562 Pain in left knee: Secondary | ICD-10-CM | POA: Insufficient documentation

## 2013-12-24 DIAGNOSIS — S86912D Strain of unspecified muscle(s) and tendon(s) at lower leg level, left leg, subsequent encounter: Secondary | ICD-10-CM

## 2013-12-24 DIAGNOSIS — M1712 Unilateral primary osteoarthritis, left knee: Secondary | ICD-10-CM

## 2013-12-24 HISTORY — DX: Pain in left knee: M25.562

## 2013-12-24 MED ORDER — HYDROCODONE-ACETAMINOPHEN 5-325 MG PO TABS: 1.0000 | ORAL_TABLET | Freq: Four times a day (QID) | ORAL | Status: DC | PRN

## 2013-12-24 NOTE — Telephone Encounter (Signed)
Patient Information:  Caller Name: [place]  Phone: [phone]  Patient: Kelsey Bruce, Kelsey Bruce  Gender: Female  DOB: 03-Dec-1950  Age: 63 Years  PCP: Kelsey Bruce [facility]  Office Follow Up:  Does the office need to follow up with this patient?: No  Instructions For The Office: N/A   Symptoms  Reason For Call & Symptoms: Tripped while going up stairs and fell , straight down on right knee but twisted left knee Monday 8/31.  Had some bruising on left knee and pain in left knee.   Seen in [place] 2 weeks later and Xrays negative.  Seen in office 9/18 for steroid injectkion in knee, told to call back if not improving.  Still lot of pain in left knee.  Reviewed Health History In EMR: Yes  Reviewed Medications In EMR: Yes  Reviewed Allergies In EMR: Yes  Reviewed Surgeries / Procedures: Yes  Date of Onset of Symptoms: 11/22/2013  Guideline(s) Used:  Leg Injury  Knee Injury  Disposition Per Guideline:   See Within 3 Days in Office  Reason For Disposition Reached:   Injury is still painful or swollen after 2 weeks  Advice Given:  N/A  Patient Will Follow Care Advice:  YES  Appointment Scheduled:  12/24/2013 13:00:00 Appointment Scheduled Provider:  Ricardo Bruce (Family Practice)

## 2013-12-24 NOTE — Progress Notes (Signed)
Pre visit review using our clinic review tool, if applicable. No additional management support is needed unless otherwise documented below in the visit note. 

## 2013-12-24 NOTE — Progress Notes (Signed)
OFFICE NOTE  12/24/2013  CC:  Chief Complaint  Patient presents with  . Knee Pain   HPI: Patient is a 63 y.o. Caucasian female who is here for 1 mo f/u left knee arthritis: she is 1 mo s/p steroid injection in this knee.  After injection it took 4 d to feel better and reached peak improvement of 50-65% at peak improvement and this has lasted but she still has a good day and a bad day b/c she still is on the knee quite a bit b/c still working.  Hurts the worst going up and down stairs and bending it. Takes ibuprofen AND mobic, pt apparently did not know to take only one of these. Takes one vicodin in daytime and 2 at bedtime.  Pertinent PMH:  Past medical, surgical, social, and family history reviewed and no changes are noted since last office visit.  MEDS: Not taking oxycodone listed below Outpatient Prescriptions Prior to Visit  Medication Sig Dispense Refill  . beta carotene w/minerals (OCUVITE) tablet Take 1 tablet by mouth daily.      . cetirizine (ZYRTEC) 10 MG tablet Take 10 mg by mouth daily.      Marland Kitchen docusate sodium (COLACE) 100 MG capsule Take 100 mg by mouth 2 (two) times daily.      Marland Kitchen ibuprofen (ADVIL,MOTRIN) 200 MG tablet Take 400 mg by mouth every 6 (six) hours as needed for mild pain or moderate pain.      . meloxicam (MOBIC) 15 MG tablet Take 15 mg by mouth daily.      . Multiple Vitamin (MULTIVITAMIN) tablet Take 1 tablet by mouth daily.      Marland Kitchen oxyCODONE-acetaminophen (PERCOCET/ROXICET) 5-325 MG per tablet Take 1-2 tablets by mouth every 4 (four) hours as needed for severe pain (moderate to severe pain (when tolerating fluids)).  30 tablet  0  . polyethylene glycol (MIRALAX / GLYCOLAX) packet Take 17 g by mouth daily.      Marland Kitchen HYDROcodone-acetaminophen (NORCO/VICODIN) 5-325 MG per tablet Take 1-2 tablets by mouth every 6 (six) hours as needed for moderate pain.  30 tablet  0   No facility-administered medications prior to visit.    PE: Blood pressure 127/86, pulse 79,  temperature 98.7 F (37.1 C), temperature source Temporal, resp. rate 18, height 5' (1.524 m), weight 140 lb (63.504 kg), SpO2 99.00%. Gen: Alert, well appearing.  Patient is oriented to person, place, time, and situation. Left knee: no effusion, warmth, or erythema. ROM fully intact without pain. Mcmurray's neg.  No joint line tend. Mild peripatellar soft tissue tenderness.  Mild + patellar grind.  No knee instability.  Patellar tendon nontender.  IMPRESSION AND PLAN:  Moderate residual left knee pain s/p strain 11/23/13. Steroid injection helped a lot. She has some patellofemoral pain that we discussed in depth today: exercises for medial quad strengthening demonstrated and pt wants to try this and current meds before trying formal PT. We went over the importance of avoiding duplicative NSAID use, decided to use mobic 15mg  qd--at this point continue qd scheduled use but when not requiring vicodin during daytime anymore then change to mobic qd PRN use. I gave rx today for #30 more vicodin.  I also fitted pt with soft knee sleeve for comfort/support: wear 3 hours on/1 hour off during day, don't wear to bed.  An After Visit Summary was printed and given to the patient.  Pt deferred flu vaccine at this time  Spent 25 min with pt today, with >50% of  this time spent in counseling and care coordination regarding the above problems.  FOLLOW UP:  6 wks

## 2013-12-29 ENCOUNTER — Telehealth: Payer: Self-pay | Admitting: *Deleted

## 2013-12-29 NOTE — Telephone Encounter (Signed)
CALLED PATIENT TO REMIND OF HDR TX. FOR 12-30-13, SPOKE WITH PATIENT AND SHE IS AWARE OF THIS [place].

## 2013-12-30 ENCOUNTER — Ambulatory Visit
Admission: RE | Admit: 2013-12-30 | Discharge: 2013-12-30 | Disposition: A | Discharge status: Home / Self Care | Payer: BC Managed Care – PPO | Source: Ambulatory Visit | Attending: Radiation Oncology | Admitting: Radiation Oncology

## 2013-12-30 DIAGNOSIS — C541 Malignant neoplasm of endometrium: Secondary | ICD-10-CM | POA: Insufficient documentation

## 2013-12-30 DIAGNOSIS — Z510 Encounter for antineoplastic radiation therapy: Secondary | ICD-10-CM | POA: Diagnosis present

## 2013-12-30 NOTE — Progress Notes (Signed)
Kelsey Promise, MD (Physician)       Radiation Oncology [phone]  ________________________________  Name: Kelsey Bruce MRN: [id number]  Date: 12/30/13 DOB: 1950-07-20   Simple treatment device Note   The patient had construction of her custom vaginal cylinder for treatment. Patient will be treated with a 3 cm non- segmented cylinder.  ____________________________________  Kelsey Promise, MD

## 2013-12-30 NOTE — Progress Notes (Signed)
Blair Promise, MD (Physician)        Radiation Oncology [phone]   ________________________________   Name: Kelsey Bruce MRN: [id number]  Date:12/30/13 DOB: 1950-04-04   High-dose-rate brachytherapy procedure Note   CC: Tammi Sou, MD Everitt Amber, MD   Diagnosis: Stage IB grade 2 endometrioid adenocarcinoma   Narrative: The patient was placed on the high-dose-rate treatment table. The patient's custom vaginal cylinder was placed in the proximal vagina. This was affixed to the CT/MR stabilization plate to prevent slippage. A fiducial Elta Guadeloupe was placed within the vaginal cylinder.   Verification simulation   The patient had a AP and lateral film obtained with the vaginal cylinder and fiducial markers in place. This documented accurate position of the vaginal cylinder for treatment.   High-dose-rate brachytherapy treatment   The remote afterloading device was affixed to the vaginal cylinder by catheter system. Patient then proceeded to undergo her third high-dose-rate treatment directed at the proximal vagina. The patient was prescribed a dose of 6 Gy to the mucosal surface. This was achieved with 1 channel using 8 dwell positions. Iridium 192 was the high-dose-rate source. The patient was treated with a 3 cm diameter cylinder, treatment length was 4 cm. Treatment time was 203.2 seconds. Please note that the treatment time was shorter than her first treatment but our high-dose-rate iridium 192 source was exchanged for a more active source between the first and second treatment. Patient tolerated the procedure well. After completion of her therapy a radiation survey was performed documenting return of the iridium source into the gamma med safe.  ____________________________________  Blair Promise, MD

## 2013-12-30 NOTE — Progress Notes (Signed)
  Radiation Oncology [phone]  ________________________________  Name: Kelsey Bruce MRN: [id number]  Date: 10/08/15DOB: Oct 01, 1950   Vaginal brachytherapy procedure Note   CC: Tammi Sou, MD Everitt Amber, MD   Diagnosis: Stage IB grade 2 endometrioid adenocarcinoma   Narrative: The patient returns today for her third high-dose-rate treatment directed at the proximal vagina. The patient has done well since her initial consultation 11/17/2013.  height is 5' (1.524 m) and weight is 145 lb 12.8 oz (66.134 kg). Her temperature is 97.5 F (36.4 C). Her blood pressure is 136/76 and her pulse is 77.   The patient was taken to the HDR suite and placed in the dorsolithotomy position. A pelvic exam was performed revealing no palpable masses. The vaginal cuff is intact. A speculum examination there are no mucosal lesions noted. Patient she did undergo placement of her vaginal cylinder. Optimal diameter to distend the vaginal vault without undue discomfort was a 3 cm cylinder. Patient tolerated the procedure well.   ____________________________________   Blair Promise, MD

## 2014-01-05 ENCOUNTER — Telehealth: Payer: Self-pay | Admitting: *Deleted

## 2014-01-05 NOTE — Telephone Encounter (Signed)
Called patient to remind of HDR Tx. For 01-06-14, spoke with patient and confirmed appt.

## 2014-01-06 ENCOUNTER — Ambulatory Visit
Admission: RE | Admit: 2014-01-06 | Discharge: 2014-01-06 | Disposition: A | Discharge status: Home / Self Care | Payer: BC Managed Care – PPO | Source: Ambulatory Visit | Attending: Radiation Oncology | Admitting: Radiation Oncology

## 2014-01-06 DIAGNOSIS — C541 Malignant neoplasm of endometrium: Secondary | ICD-10-CM

## 2014-01-06 DIAGNOSIS — Z510 Encounter for antineoplastic radiation therapy: Secondary | ICD-10-CM | POA: Diagnosis not present

## 2014-01-06 NOTE — Progress Notes (Signed)
   Radiation Oncology [phone]) [phone]  ________________________________   Name: Kelsey Bruce MRN: [id number]   Date:01/06/14 DOB: 03/10/1951   High-dose-rate brachytherapy procedure Note   CC: Tammi Sou, MD Everitt Amber, MD   Diagnosis: Stage IB grade 2 endometrioid adenocarcinoma   Narrative: The patient was placed on the high-dose-rate treatment table. The patient's custom vaginal cylinder was placed in the proximal vagina. This was affixed to the CT/MR stabilization plate to prevent slippage. A fiducial Elta Gu[place] was placed within the vaginal cylinder.   Verification simulation   The patient had a AP and lateral film obtained with the vaginal cylinder and fiducial markers in place. This documented accurate position of the vaginal cylinder for treatment.   High-dose-rate brachytherapy treatment   The remote afterloading device was affixed to the vaginal cylinder by catheter system. Patient then proceeded to undergo her fourth high-dose-rate treatment directed at the proximal vagina. The patient was prescribed a dose of 6 Gy to the mucosal surface. This was achieved with 1 channel using 8 dwell positions. Iridium 192 was the high-dose-rate source. The patient was treated with a 3 cm diameter cylinder, treatment length was 4 cm. Treatment time was 217 seconds. Please note that the treatment time was shorter than her first treatment as our high-dose-rate iridium 192 source was exchanged for a more active source between the first and second treatment. Patient tolerated the procedure well. After completion of her therapy a radiation survey was performed documenting return of the iridium source into the gamma med safe.  ____________________________________  Blair Promise, MD

## 2014-01-06 NOTE — Progress Notes (Signed)
  Radiation Oncology [phone]   ________________________________  Name: KAELEN BRENNAN MRN: [id number]   Date: 01/06/14 DOB: 1950-06-21   Simple treatment device Note   The patient had construction of her custom vaginal cylinder for treatment. Patient will be treated with a 3 cm non- segmented cylinder.  ____________________________________  Blair Promise, MD

## 2014-01-06 NOTE — Progress Notes (Signed)
Sondra Come, MD (Physician)        Radiation Oncology [phone]   ________________________________  Name: Kelsey Bruce MRN: [id number]   Date: 10/15/15DOB: Jun 05, 1950   Vaginal brachytherapy procedure Note   CC: Tammi Sou, MD Everitt Amber, MD    Diagnosis: Stage IB grade 2 endometrioid adenocarcinoma  Narrative: The patient returns today for her fourth high-dose-rate treatment directed at the proximal vagina. The patient has done well since her initial consultation 11/17/2013.  The patient was taken to the HDR suite and placed in the dorsolithotomy position. A pelvic exam was performed revealing no palpable masses. The vaginal cuff is intact. A speculum examination there are no mucosal lesions noted. Patient she did undergo placement of her vaginal cylinder. Optimal diameter to distend the vaginal vault without undue discomfort was a 3 cm cylinder. Patient tolerated the procedure well.  ____________________________________  Blair Promise, MD

## 2014-01-12 ENCOUNTER — Telehealth: Payer: Self-pay | Admitting: *Deleted

## 2014-01-12 NOTE — Telephone Encounter (Signed)
Called patient to remind of HDR Tx. For 01-13-14 @ 9 am, spoke with patient and she is aware of this appt.

## 2014-01-13 ENCOUNTER — Encounter: Payer: Self-pay | Admitting: Radiation Oncology

## 2014-01-13 ENCOUNTER — Ambulatory Visit
Admission: RE | Admit: 2014-01-13 | Discharge: 2014-01-13 | Disposition: A | Discharge status: Home / Self Care | Payer: BC Managed Care – PPO | Source: Ambulatory Visit | Attending: Radiation Oncology | Admitting: Radiation Oncology

## 2014-01-13 DIAGNOSIS — Z510 Encounter for antineoplastic radiation therapy: Secondary | ICD-10-CM | POA: Diagnosis not present

## 2014-01-13 DIAGNOSIS — C541 Malignant neoplasm of endometrium: Secondary | ICD-10-CM

## 2014-01-13 NOTE — Progress Notes (Signed)
Editor: Blair Promise, MD (Physician)        Radiation Oncology [phone]  ________________________________  Name: STORY CONTI MRN: [id number]  Date:01/13/14 DOB: 26-Apr-1950   High-dose-rate brachytherapy procedure Note   CC: Tammi Sou, MD Everitt Amber, MD   Diagnosis: Stage IB grade 2 endometrioid adenocarcinoma   Narrative: The patient was placed on the high-dose-rate treatment table. The patient's custom vaginal cylinder was placed in the proximal vagina. This was affixed to the CT/MR stabilization plate to prevent slippage. A fiducial Elta [place] was placed within the vaginal cylinder.   Verification simulation   The patient had a AP and lateral film obtained with the vaginal cylinder and fiducial markers in place. This documented accurate position of the vaginal cylinder for treatment.   High-dose-rate brachytherapy treatment   The remote afterloading device was affixed to the vaginal cylinder by catheter system. Patient then proceeded to undergo her fifth high-dose-rate treatment directed at the proximal vagina. The patient was prescribed a dose of 6 Gy to the mucosal surface. This was achieved with 1 channel using 8 dwell positions. Iridium 192 was the high-dose-rate source. The patient was treated with a 3 cm diameter cylinder, treatment length was 4 cm. Treatment time was 231.8 seconds. Please note that the treatment time was shorter than her first treatment as our high-dose-rate iridium 192 source was exchanged for a more active source between the first and second treatment. Patient tolerated the procedure well. After completion of her therapy a radiation survey was performed documenting return of the iridium source into the gamma med safe.  ____________________________________  Blair Promise, MD

## 2014-01-13 NOTE — Progress Notes (Signed)
Kelsey Come, MD (Physician)         Radiation Oncology [phone]  ________________________________  Name: [name] MRN: [id number]  Date: 10/22/15DOB: 1950-05-27   Vaginal brachytherapy procedure Note    CC: Tammi Sou, MD Everitt Amber, MD  Diagnosis: Stage IB grade 2 endometrioid adenocarcinoma   Narrative: The patient returns today for her fifth high-dose-rate treatment directed at the proximal vagina. The patient has done well since her initial consultation 11/17/2013.  The patient was taken to the HDR suite and placed in the dorsolithotomy position. A pelvic exam was performed revealing no palpable masses. The vaginal cuff is intact. A speculum examination there are no mucosal lesions noted. Patient she did undergo placement of her vaginal cylinder. Optimal diameter to distend the vaginal vault without undue discomfort was a 3 cm cylinder. Patient tolerated the procedure well.  ____________________________________  Blair Promise, MD

## 2014-01-13 NOTE — Progress Notes (Signed)
Editor: Blair Promise, MD (Physician)        Radiation Oncology [phone]  ________________________________  Name: Kelsey Bruce MRN: [id number]  Date: 01/13/14 DOB: 1950/10/15   Simple treatment device Note   The patient had construction of her custom vaginal cylinder for treatment. Patient will be treated with a 3 cm non- segmented cylinder.  ____________________________________  Blair Promise, MD

## 2014-01-23 ENCOUNTER — Encounter: Payer: Self-pay | Admitting: Radiation Oncology

## 2014-01-23 HISTORY — PX: LEG SURGERY: SHX1003

## 2014-01-23 NOTE — Progress Notes (Signed)
  Radiation Oncology         [phone] ________________________________  Name: IRACEMA LANAGAN MRN: [id number]  Date: 01/23/2014  DOB: 02-26-1951  End of Treatment Note  Diagnosis: Stage IB grade 2 endometrioid adenocarcinoma    Indication for treatment:  Postop to reduce chances for vaginal cuff recurrence       Radiation treatment dates:   September 24, September and 30th, October 8, October 15, October 22  Site/dose:   Proximal vagina 30 gray in 5 fractions (6 gray per fraction)   Beams/energy:   The patient was treated with iridium 192 as the high-dose-rate source. The patient was treated with a 3 cm diameter solid cylinder, a 4 CM treatment length, 6 gray delivered to the vaginal mucosal surface  Narrative: The patient tolerated radiation treatment relatively well.   She had minimal discomfort in the vaginal area with her treatments.  Plan: The patient has completed radiation treatment. The patient will return to radiation oncology clinic for routine followup in one month. I advised them to call or return sooner if they have any questions or concerns related to their recovery or treatment.  -----------------------------------  Blair Promise, PhD, MD

## 2014-01-24 ENCOUNTER — Encounter: Payer: Self-pay | Admitting: Radiation Oncology

## 2014-02-04 ENCOUNTER — Ambulatory Visit: Payer: BC Managed Care – PPO | Admitting: Family Medicine

## 2014-02-10 ENCOUNTER — Encounter: Payer: Self-pay | Admitting: Gynecologic Oncology

## 2014-02-10 ENCOUNTER — Ambulatory Visit: Payer: BC Managed Care – PPO | Attending: Gynecologic Oncology | Admitting: Gynecologic Oncology

## 2014-02-10 VITALS — BP 140/75 | HR 75 | Temp 97.7°F | Resp 18 | Ht 60.0 in | Wt 147.2 lb

## 2014-02-10 DIAGNOSIS — G809 Cerebral palsy, unspecified: Secondary | ICD-10-CM | POA: Insufficient documentation

## 2014-02-10 DIAGNOSIS — G5760 Lesion of plantar nerve, unspecified lower limb: Secondary | ICD-10-CM | POA: Insufficient documentation

## 2014-02-10 DIAGNOSIS — M1990 Unspecified osteoarthritis, unspecified site: Secondary | ICD-10-CM | POA: Insufficient documentation

## 2014-02-10 DIAGNOSIS — Z90710 Acquired absence of both cervix and uterus: Secondary | ICD-10-CM | POA: Diagnosis not present

## 2014-02-10 DIAGNOSIS — Z90722 Acquired absence of ovaries, bilateral: Secondary | ICD-10-CM | POA: Insufficient documentation

## 2014-02-10 DIAGNOSIS — C541 Malignant neoplasm of endometrium: Secondary | ICD-10-CM | POA: Insufficient documentation

## 2014-02-10 DIAGNOSIS — Z9079 Acquired absence of other genital organ(s): Secondary | ICD-10-CM | POA: Diagnosis not present

## 2014-02-10 DIAGNOSIS — J309 Allergic rhinitis, unspecified: Secondary | ICD-10-CM | POA: Insufficient documentation

## 2014-02-10 DIAGNOSIS — H8110 Benign paroxysmal vertigo, unspecified ear: Secondary | ICD-10-CM | POA: Insufficient documentation

## 2014-02-10 DIAGNOSIS — Z79899 Other long term (current) drug therapy: Secondary | ICD-10-CM | POA: Insufficient documentation

## 2014-02-10 NOTE — Progress Notes (Signed)
HPI:  Kelsey Bruce is a 63 y.o. year old G2P2002 initially seen in consultation on 6/26 for endometrial cancer referred by Dr Hulan Fray.  She then underwent a robotic assisted total hysterectomy, BSO and pelvic lymphadenectomy on 2/99/37 without complications.  Her postoperative course was uncomplicated.  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 She was recommended to receive adjuvant vaginal brachytherapy to reduce the risk for local recurrence based on her high/intermediate risk factors.   Pathology report 10/05/13:  1. Uterus +/- tubes/ovaries, neoplastic, with cervix - INVASIVE ENDOMETRIOID CARCINOMA, FIGO GRADE II, INVADING INTO OUTER HALF OF THE MYOMETRIUM WITH ANGIOLYMPHATIC INVASION PRESENT. - CERVIX: BENIGN SQUAMOUS MUCOSA AND ENDOCERVICAL MUCOSA, NO DYSPLASIA OR MALIGNANCY. - BILATERAL OVARIES AND FALLOPIAN TUBES: BENIGN OVARIAN TISSUE AND FALLOPIAN TUBAL TISSUE, NO EVIDENCE OF MALIGNANCY. - PLEASE SEE ONCOLOGY TEMPLATE FOR DETAILS. 2. Lymph nodes, regional resection, left pelvic - SIX LYMPH NODES, NEGATIVE FOR METASTATIC CARCINOMA (0/6). 3. Lymph nodes, regional resection, right pelvic - THIRTEEN LYMPH NODES, NEGATIVE FOR METASTATIC CARCINOMA (0/13). Microscopic Comment 1. ONCOLOGY TABLE-UTERUS, CARCINOMA Specimen: Uterus, cervix, bilateral ovaries and fallopian tubes. Procedure: Total hysterectomy and bilateral salpingo-oophorectomy. Lymph node sampling performed: Yes Specimen integrity: Intact. Maximum tumor size: 4.0 cm, gross measurement. Histologic type: Invasive endometrioid carcinoma. Grade: FIGO Grade II Myometrial invasion: 1.2 cm where myometrium is 1.5 cm in thickness Cervical stromal involvement: No Extent of involvement of other organs: No Lymph - vascular invasion: Present. Peritoneal washings: Negative  ([id number]) Lymph nodes: # examined 19 ; # positive 0 Pelvic lymph nodes: 0 involved of 19 lymph nodes. Para-aortic lymph nodes:    She completed her vaginal cuff brachyherapy in October and is doing fairly well. She is having some vaginal dryness and itching externally at night. She tore some cartilage in her left knee in September is having arthroscopic surgery tomorrow.   Review of systems: Constitutional:  She has no weight gain or weight loss. She has no fever or chills. Eyes: No blurred vision sores. Cardiovascular: No chest pain or edema. Respiratory:  No shortness of breath Gastrointestinal: She has normal bowel movements without diarrhea or constipation. She denies any nausea or vomiting. She denies blood in her stool or heart burn. Genitourinary:  She denies pelvic pain, pelvic pressure or changes in her urinary function. She has no hematuria, dysuria, or incontinence. She has no irregular vaginal bleeding or vaginal discharge Musculoskeletal: + left knee pain Skin:  + vulvar itching Psychiatric:  She denies depression or anxiety. Hematologic/Lymphatic:   No easy bruising or bleeding  No Known Allergies Outpatient Encounter Prescriptions as of 02/10/2014  Medication Sig  . beta carotene w/minerals (OCUVITE) tablet Take 1 tablet by mouth daily.  . cetirizine (ZYRTEC) 10 MG tablet Take 10 mg by mouth daily.  Marland Kitchen docusate sodium (COLACE) 100 MG capsule Take 100 mg by mouth daily as needed.   Marland Kitchen HYDROcodone-acetaminophen (NORCO/VICODIN) 5-325 MG per tablet Take 1-2 tablets by mouth every 6 (six) hours as needed for moderate pain.  . Multiple Vitamin (MULTIVITAMIN) tablet Take 1 tablet by mouth daily.  . meloxicam (MOBIC) 15 MG tablet Take 15 mg by mouth daily.  . polyethylene glycol (MIRALAX / GLYCOLAX) packet Take 17 g by mouth daily as needed.    Past Medical History  Diagnosis Date  . Arthritis     LB, HIPs, knees, hands  . Seasonal allergic rhinitis   .  Morton's neuroma  2012    Dr. Roseanne Reno did injections in the past  . BPPV (benign paroxysmal positional vertigo)   . Cerebral palsy   . Endometrial adenocarcinoma 09/2013    Dr. Denman George: pt to get vaginal brachytherapy via rad onc   Oncology History   63 year old woman with stage IB grade 2 endometriod endometrial adenocarcinoma with high/intermediate risk factors. Recommended to receive adjuvant vaginal brachytherapy after definitive staging to reduce the risk for recurrence.     Endometrial cancer, grade I   09/06/2013 Initial Diagnosis Endometrial cancer, grade I   10/05/2013 Surgery robotic assisted total hysterctomy, BSO, pelvic lymphadenectomy   12/16/2013 - 01/13/2014 Radiation Therapy Vaginal cuff brachytherapy   Past Surgical History  Procedure Laterality Date  . Dilation and curettage of uterus    . Tonsillectomy    . Colonoscopy  2004 (Dr. Henrene Pastor)    poly; recall sent 2009 but pt apparently didn't respond.  . Tubal ligation    . Robotic assisted total hysterectomy with bilateral salpingo oopherectomy Bilateral 10/05/2013    Procedure: ROBOTIC ASSISTED TOTAL HYSTERECTOMY WITH BILATERAL SALPINGO OOPHORECTOMY WITH LYMPH NODE DISECTION;  Surgeon: Everitt Amber, MD;  Location: [facility];  Service: Gynecology;  Laterality: Bilateral;   Family History  Problem Relation Age of Onset  . Heart disease Father   . Hypertension Father   . Cancer Father     lung  . Arthritis Father   . Alcohol abuse Father   . Heart disease Mother   . Cancer Mother     lung  . Deep vein thrombosis Mother   . Alcohol abuse Mother   . Heart disease Brother   . Hypertension Brother   . Heart disease Brother   . Hypertension Brother   . Heart disease Brother   . Hypertension Brother    History   Social History  . Marital Status: Divorced    Spouse Name: N/A    Number of Children: 2  . Years of Education: N/A   Occupational History  . retired    Social History Main Topics  . Smoking status: Never Smoker   .  Smokeless tobacco: Never Used  . Alcohol Use: No     Comment: occasional  . Drug Use: No  . Sexual Activity: No   Other Topics Concern  . None   Social History Narrative   Divorced.  Two children.   Orig from [place], [place].   HS education.   Works at [employer] in [place].   No tobacco, occ alcohol, no drugs.   Has 3 brothers and they all smoked, as did her parents.    Physical Exam: Blood pressure 140/75, pulse 75, temperature 97.7 F (36.5 C), temperature source Oral, resp. rate 18, height 5' (1.524 m), weight 147 lb 3.2 oz (66.769 kg). General: Well dressed, well nourished in no apparent distress.    Lungs:  Clear to auscultation bilaterally.  No wheezes.  Cardiovascular:  Regular rate and rhythm.   Abdomen:  Soft, nontender, nondistended.  No palpable masses.  No hepatosplenomegaly.  No ascites. Normal bowel sounds.  No hernias.  Incisions are well healed.  Genitourinary: Normal EGBUS  Vaginal cuff intact. No visible lesions, atrophic.  No bleeding or discharge.  No masses or nodularity, rectal confirms.  Extremities: No cyanosis, clubbing or edema.   Psychiatric: Mood and affect are appropriate.   Assessment:    63 y.o. 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   S/p robotic  hysterectomy, BSO, lymphadenectomy on 10/05/13. + LVSI, 80% myometrial invasion, negative pelvic washings and negative lymph nodes. She has completed her vaginal cuff brachytherapy with her last treatment on October 22. She's doing very well post radiation with the normal expected side effects.     Plan: 1) Follow-up with Dr. Sondra Come in 3 months and return to see GYN oncology in 6 months. Nancy Marus A., MD

## 2014-02-10 NOTE — Patient Instructions (Signed)
Follow up with Dr. Sondra Come in 3 months and gyn oncology in 6 months. Happy Holidays.

## 2014-02-23 ENCOUNTER — Encounter: Payer: Self-pay | Admitting: Oncology

## 2014-02-24 ENCOUNTER — Encounter: Payer: Self-pay | Admitting: Radiation Oncology

## 2014-02-24 ENCOUNTER — Ambulatory Visit
Admission: RE | Admit: 2014-02-24 | Discharge: 2014-02-24 | Disposition: A | Discharge status: Home / Self Care | Payer: BC Managed Care – PPO | Source: Ambulatory Visit | Attending: Radiation Oncology | Admitting: Radiation Oncology

## 2014-02-24 VITALS — BP 132/81 | HR 77 | Temp 97.9°F | Resp 10 | Wt 148.1 lb

## 2014-02-24 DIAGNOSIS — C541 Malignant neoplasm of endometrium: Secondary | ICD-10-CM

## 2014-02-24 NOTE — Progress Notes (Signed)
Kelsey Bruce a size M vaginal dilator and Surgilube.  She was educated to apply Surgilube to the dilator and to use it three times a week (Monday, Wednesday, Friday) for 10 minutes.  Kelsey Bruce verbalized understanding.

## 2014-02-24 NOTE — Progress Notes (Signed)
She rates her pain as a 4 on a scale of 0-10. Pt had left knee surgery on November 20th 2015.  Pt reports pain as aching and intermittently. Reports some lower back pain when she first gets out of bed. Pt complains of Generalized Weakness.  Reports urinary frequency,Dribbling and Incontinence.  Pt states they urinate 1 time per night.  Denies vaginal abnormalities.  Pt reports Constipation, from pain medication.

## 2014-02-24 NOTE — Progress Notes (Signed)
  Radiation Oncology         [phone]) [phone] ________________________________  Name: Kelsey Bruce MRN: [id number]  Date: 02/24/2014  DOB: Aug 15, 1950  Follow-Up Visit Note  CC: Tammi Sou, MD  McGowen, Adrian Blackwater, MD    ICD-9-CM ICD-10-CM   1. Endometrial cancer, grade I 182.0 C54.1     Diagnosis:   *Stage IB grade 2 endometrioid adenocarcinoma **  Interval Since Last Radiation:  6  weeks  Narrative:  The patient returns today for routine follow-up.  She is doing well at this time and without complaints. She denies any urinary symptoms vaginal discomfort or bowel issues after her brachytherapy. Patient did see Dr. Alycia Rossetti last month with the good report.                              ALLERGIES:  has No Known Allergies.  Meds: Current Outpatient Prescriptions  Medication Sig Dispense Refill  . beta carotene w/minerals (OCUVITE) tablet Take 1 tablet by mouth daily.    . cetirizine (ZYRTEC) 10 MG tablet Take 10 mg by mouth daily.    Marland Kitchen docusate sodium (COLACE) 100 MG capsule Take 100 mg by mouth daily as needed.     Marland Kitchen HYDROcodone-acetaminophen (NORCO/VICODIN) 5-325 MG per tablet Take 1-2 tablets by mouth every 6 (six) hours as needed for moderate pain. 30 tablet 0  . meloxicam (MOBIC) 15 MG tablet Take 15 mg by mouth daily.    . Multiple Vitamin (MULTIVITAMIN) tablet Take 1 tablet by mouth daily.    . polyethylene glycol (MIRALAX / GLYCOLAX) packet Take 17 g by mouth daily as needed.      No current facility-administered medications for this encounter.    Physical Findings: The patient is in no acute distress. Patient is alert and oriented.  weight is 148 lb 1.6 oz (67.178 kg). Her oral temperature is 97.9 F (36.6 C). Her blood pressure is 132/81 and her pulse is 77. Her respiration is 10 and oxygen saturation is 99%. .  The lungs are clear. The heart has a regular rhythm and rate. The abdomen is soft and nontender with normal bowel sounds. A pelvic exam as not performed in light  of recent exam by a gynecologic oncology.  Lab Findings: Lab Results  Component Value Date   WBC 9.5 10/06/2013   HGB 12.5 10/06/2013   HCT 36.8 10/06/2013   MCV 86.6 10/06/2013   PLT 254 10/06/2013    Radiographic Findings: No results found.  Impression:  Patient has done well since her intracavitary brachytherapy treatments, without any obvious side effects.  Plan:  Routine followup in mid February 2016 for pelvic exam and Pap smear. Today the patient was given a vaginal dilator and instructions on its use.  ____________________________________ Blair Promise, MD

## 2014-05-05 ENCOUNTER — Encounter: Payer: Self-pay | Admitting: Radiation Oncology

## 2014-05-05 ENCOUNTER — Ambulatory Visit
Admission: RE | Admit: 2014-05-05 | Discharge: 2014-05-05 | Disposition: A | Discharge status: Home / Self Care | Payer: Medicare Other | Source: Ambulatory Visit | Attending: Radiation Oncology | Admitting: Radiation Oncology

## 2014-05-05 ENCOUNTER — Other Ambulatory Visit (HOSPITAL_COMMUNITY)
Admission: RE | Admit: 2014-05-05 | Discharge: 2014-05-05 | Disposition: A | Discharge status: Home / Self Care | Payer: Medicare Other | Source: Ambulatory Visit | Attending: Radiation Oncology | Admitting: Radiation Oncology

## 2014-05-05 VITALS — BP 154/91 | HR 68 | Temp 98.5°F | Resp 12 | Ht 60.0 in | Wt 148.0 lb

## 2014-05-05 DIAGNOSIS — C541 Malignant neoplasm of endometrium: Secondary | ICD-10-CM

## 2014-05-05 DIAGNOSIS — Z01411 Encounter for gynecological examination (general) (routine) with abnormal findings: Secondary | ICD-10-CM | POA: Insufficient documentation

## 2014-05-05 NOTE — Progress Notes (Signed)
  Radiation Oncology         [phone] ________________________________  Name: Kelsey Bruce MRN: [id number]  Date: 05/05/2014  DOB: 1950/05/08  Follow-Up Visit Note  CC: Tammi Sou, MD  Everitt Amber, MD    ICD-9-CM ICD-10-CM   1. Endometrial cancer, grade I 182.0 C54.1 Cytology - PAP    Diagnosis:   Stage IB grade 2 endometrioid adenocarcinoma   Interval Since Last Radiation:  4  months  Narrative:  The patient returns today for routine follow-up.  She is doing well and without complaints. She continues to be more mobile since she had her left knee surgery. Patient is using her vaginal dilator. She denies any bleeding with this procedure hematuria or rectal bleeding.                              ALLERGIES:  has No Known Allergies.  Meds: Current Outpatient Prescriptions  Medication Sig Dispense Refill  . beta carotene w/minerals (OCUVITE) tablet Take 1 tablet by mouth daily.    . cetirizine (ZYRTEC) 10 MG tablet Take 10 mg by mouth daily.    Marland Kitchen docusate sodium (COLACE) 100 MG capsule Take 100 mg by mouth daily as needed.     . meloxicam (MOBIC) 15 MG tablet Take 15 mg by mouth daily.    . Multiple Vitamin (MULTIVITAMIN) tablet Take 1 tablet by mouth daily.    . polyethylene glycol (MIRALAX / GLYCOLAX) packet Take 17 g by mouth daily as needed.     Marland Kitchen HYDROcodone-acetaminophen (NORCO/VICODIN) 5-325 MG per tablet Take 1-2 tablets by mouth every 6 (six) hours as needed for moderate pain. (Patient not taking: Reported on 05/05/2014) 30 tablet 0  . oxyCODONE-acetaminophen (PERCOCET/ROXICET) 5-325 MG per tablet   0   No current facility-administered medications for this encounter.    Physical Findings: The patient is in no acute distress. Patient is alert and oriented.  height is 5' (1.524 m) and weight is 148 lb (67.132 kg). Her oral temperature is 98.5 F (36.9 C). Her blood pressure is 154/91 and her pulse is 68. Her respiration is 12. .  No palpable subclavicular or  axillary adenopathy. The lungs are clear to auscultation. The heart has a regular rhythm and rate. The abdomen is soft and nontender with normal bowel sounds. The inguinal areas are free of adenopathy. On pelvic examination the external genitalia are unremarkable. A speculum exam is performed. Mild radiation changes are noted in the proximal vagina. A Pap smear was obtained of the proximal vagina. On bimanual and rectovaginal examination there no pelvic masses appreciated.  Lab Findings: Lab Results  Component Value Date   WBC 9.5 10/06/2013   HGB 12.5 10/06/2013   HCT 36.8 10/06/2013   MCV 86.6 10/06/2013   PLT 254 10/06/2013    Radiographic Findings: No results found.  Impression: No evidence of recurrence on clinical exam today, Pap smear pending  Plan:  Routine follow-up in 6 months. In the interim the patient will be seen by gynecologic oncology.  ____________________________________ Blair Promise, MD

## 2014-05-05 NOTE — Progress Notes (Signed)
Kelsey Bruce here for follow up after treatment for endometrial cancer.  She denies pain, bowel issues, bladder issues, vaginal/rectal bleeding and nausea.  She reports having fatigue in the early afternoons.  She reports her left knee is starting to feel better after having surgery in November.  She is using a cane.  BP 154/91 mmHg  Pulse 68  Temp(Src) 98.5 F (36.9 C) (Oral)  Resp 12  Ht 5' (1.524 m)  Wt 148 lb (67.132 kg)  BMI 28.90 kg/m2

## 2014-05-09 LAB — CYTOLOGY - PAP

## 2014-05-11 ENCOUNTER — Telehealth: Payer: Self-pay | Admitting: Oncology

## 2014-05-11 NOTE — Telephone Encounter (Signed)
Left a message regarding Deadra's good pap smear results per Dr. Sondra Come.  Requested a return call.

## 2014-05-13 ENCOUNTER — Telehealth: Payer: Self-pay | Admitting: Oncology

## 2014-05-13 NOTE — Telephone Encounter (Signed)
Called Kelsey Bruce and let her know her pap smear was normal per Dr. Sondra Come.  [name] verbalized understanding.

## 2014-05-19 ENCOUNTER — Other Ambulatory Visit: Payer: Self-pay | Admitting: Family Medicine

## 2014-05-19 NOTE — Telephone Encounter (Signed)
Meloxicam rx denied.  Pt was supposed to f/u 6 weeks after last appointment 12/24/2013.

## 2014-05-26 ENCOUNTER — Telehealth: Payer: Self-pay | Admitting: Family Medicine

## 2014-05-26 ENCOUNTER — Other Ambulatory Visit: Payer: Self-pay | Admitting: Family Medicine

## 2014-05-26 MED ORDER — MELOXICAM 15 MG PO TABS: ORAL_TABLET | ORAL | Status: DC

## 2014-05-26 NOTE — Telephone Encounter (Signed)
I sent in meloxicam RF as per pt request.

## 2014-05-26 NOTE — Telephone Encounter (Signed)
rf request for meloxicam.  Pt last OV was 12/24/13.  Pt was supposed to f/u 6 weeks after that OV.  PLease advise rf.

## 2014-08-08 ENCOUNTER — Telehealth: Payer: Self-pay | Admitting: *Deleted

## 2014-08-08 NOTE — Telephone Encounter (Signed)
Patient called stating she will not be able to come 5/18 to see Dr. Alycia Bruce. She states she has previously seen Dr. Denman George and would like to reschedule to the following week with her. Pt given appt for 08/17/14 at 2:00pm with Dr. Denman George - patient agreeable to new appt.

## 2014-08-10 ENCOUNTER — Ambulatory Visit: Payer: BC Managed Care – PPO | Admitting: Gynecologic Oncology

## 2014-08-11 ENCOUNTER — Ambulatory Visit: Payer: BC Managed Care – PPO | Admitting: Gynecologic Oncology

## 2014-08-17 ENCOUNTER — Ambulatory Visit: Payer: 59 | Attending: Gynecologic Oncology | Admitting: Gynecologic Oncology

## 2014-08-17 ENCOUNTER — Encounter: Payer: Self-pay | Admitting: Gynecologic Oncology

## 2014-08-17 VITALS — BP 143/92 | HR 81 | Temp 97.9°F | Resp 18 | Ht 60.0 in | Wt 147.2 lb

## 2014-08-17 DIAGNOSIS — Z08 Encounter for follow-up examination after completed treatment for malignant neoplasm: Secondary | ICD-10-CM | POA: Diagnosis not present

## 2014-08-17 DIAGNOSIS — Z8542 Personal history of malignant neoplasm of other parts of uterus: Secondary | ICD-10-CM | POA: Insufficient documentation

## 2014-08-17 DIAGNOSIS — C541 Malignant neoplasm of endometrium: Secondary | ICD-10-CM | POA: Diagnosis not present

## 2014-08-17 DIAGNOSIS — G809 Cerebral palsy, unspecified: Secondary | ICD-10-CM | POA: Diagnosis not present

## 2014-08-17 DIAGNOSIS — Z90710 Acquired absence of both cervix and uterus: Secondary | ICD-10-CM | POA: Diagnosis not present

## 2014-08-17 DIAGNOSIS — Z90722 Acquired absence of ovaries, bilateral: Secondary | ICD-10-CM | POA: Diagnosis not present

## 2014-08-17 DIAGNOSIS — Z923 Personal history of irradiation: Secondary | ICD-10-CM | POA: Diagnosis not present

## 2014-08-17 NOTE — Patient Instructions (Signed)
We will see you back in our office in November. Please call sooner if you have any questions or concerns.

## 2014-08-17 NOTE — Progress Notes (Signed)
GYN ONC FOLLOWUP  Assessment:    64 y.o. 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   S/p robotic hysterectomy, BSO, lymphadenectomy on 10/05/13. + LVSI, 80% myometrial invasion, negative pelvic washings and negative lymph nodes. She has completed her vaginal cuff brachytherapy with her last treatment on January 13, 2014. She's doing very well post radiation with the normal expected side effects.     Plan: 1) Follow-up with Dr. Sondra Come in 3 months and return to see GYN oncology in 6 months.  HPI:  Kelsey Bruce is a 64 y.o. year old G2P2002 initially seen in consultation on 6/26 for endometrial cancer referred by Dr Hulan Fray.  She then underwent a robotic assisted total hysterectomy, BSO and pelvic lymphadenectomy on 09/10/48 without complications.  Her postoperative course was uncomplicated.  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 She was recommended to receive adjuvant vaginal brachytherapy to reduce the risk for local recurrence based on her high/intermediate risk factors.   Pathology report 10/05/13:  1. Uterus +/- tubes/ovaries, neoplastic, with cervix - INVASIVE ENDOMETRIOID CARCINOMA, FIGO GRADE II, INVADING INTO OUTER HALF OF THE MYOMETRIUM WITH ANGIOLYMPHATIC INVASION PRESENT. - CERVIX: BENIGN SQUAMOUS MUCOSA AND ENDOCERVICAL MUCOSA, NO DYSPLASIA OR MALIGNANCY. - BILATERAL OVARIES AND FALLOPIAN TUBES: BENIGN OVARIAN TISSUE AND FALLOPIAN TUBAL TISSUE, NO EVIDENCE OF MALIGNANCY. - PLEASE SEE ONCOLOGY TEMPLATE FOR DETAILS. 2. Lymph nodes, regional resection, left pelvic - SIX LYMPH NODES, NEGATIVE FOR METASTATIC CARCINOMA (0/6). 3. Lymph nodes, regional resection, right pelvic - THIRTEEN LYMPH NODES, NEGATIVE FOR METASTATIC CARCINOMA (0/13). Microscopic Comment 1. ONCOLOGY TABLE-UTERUS, CARCINOMA Specimen: Uterus,  cervix, bilateral ovaries and fallopian tubes. Procedure: Total hysterectomy and bilateral salpingo-oophorectomy. Lymph node sampling performed: Yes Specimen integrity: Intact. Maximum tumor size: 4.0 cm, gross measurement. Histologic type: Invasive endometrioid carcinoma. Grade: FIGO Grade II Myometrial invasion: 1.2 cm where myometrium is 1.5 cm in thickness Cervical stromal involvement: No Extent of involvement of other organs: No Lymph - vascular invasion: Present. Peritoneal washings: Negative ([id number]) Lymph nodes: # examined 19 ; # positive 0 Pelvic lymph nodes: 0 involved of 19 lymph nodes. Para-aortic lymph nodes:   Interval Hx:  Her knee continues to bother her however she has no symptoms concerning for recurrence of her endometrial cancer.   Review of systems: Constitutional:  She has no weight gain or weight loss. She has no fever or chills. Eyes: No blurred vision sores. Cardiovascular: No chest pain or edema. Respiratory:  No shortness of breath Gastrointestinal: She has normal bowel movements without diarrhea or constipation. She denies any nausea or vomiting. She denies blood in her stool or heart burn. Genitourinary:  She denies pelvic pain, pelvic pressure or changes in her urinary function. She has no hematuria, dysuria, or incontinence. She has no irregular vaginal bleeding or vaginal discharge Musculoskeletal: + left knee pain Skin:  + vulvar itching Psychiatric:  She denies depression or anxiety. Hematologic/Lymphatic:   No easy bruising or bleeding  No Known Allergies Outpatient Encounter Prescriptions as of 08/17/2014  . Order #: [id number] Class: Historical Med  . Order #: [id number] Class: Historical Med  . Order #: [id number] Class: Historical Med  . Order #: [id number] Class: Print  . Order #: [id number] Class: Normal  . Order #: [id number] Class: Historical Med  . Order #: [id number] Class: Historical Med  . Order #: [id number] Class: Historical Med  .  Order #: [id number] Class: Historical Med  . [DISCONTINUED] Order #: [id number] Class: Historical Med   Past Medical History  Diagnosis Date  . Arthritis     LB, HIPs, knees, hands  . Seasonal allergic rhinitis   . Morton's neuroma 2012    Dr. Roseanne Reno did injections in the past  . BPPV (benign paroxysmal positional vertigo)   . Cerebral palsy   . Endometrial adenocarcinoma 09/2013    Dr. Denman George: pt to get vaginal brachytherapy via rad onc  . Radiation 12/16/13, 12/22/13, 12/30/13, 01/06/14, 01/13/14    proximal vagina 28 gray   Oncology History   64 year old woman with stage IB grade 2 endometriod endometrial adenocarcinoma with high/intermediate risk factors. Recommended to receive adjuvant vaginal brachytherapy after definitive staging to reduce the risk for recurrence.     Endometrial cancer, grade I   09/06/2013 Initial Diagnosis Endometrial cancer, grade I   10/05/2013 Surgery robotic assisted total hysterctomy, BSO, pelvic lymphadenectomy   12/16/2013 - 01/13/2014 Radiation Therapy Vaginal cuff brachytherapy   Past Surgical History  Procedure Laterality Date  . Dilation and curettage of uterus    . Tonsillectomy    . Colonoscopy  2004 (Dr. Henrene Pastor)    poly; recall sent 2009 but pt apparently didn't respond.  . Tubal ligation    . Robotic assisted total hysterectomy with bilateral salpingo oopherectomy Bilateral 10/05/2013    Procedure: ROBOTIC ASSISTED TOTAL HYSTERECTOMY WITH BILATERAL SALPINGO OOPHORECTOMY WITH LYMPH NODE DISECTION;  Surgeon: Everitt Amber, MD;  Location: [facility];  Service: Gynecology;  Laterality: Bilateral;  . Leg surgery Left 01/2014    "cleaned out cartilage"   Family History  Problem Relation Age of Onset  . Heart disease Father   . Hypertension Father   . Cancer Father     lung  . Arthritis Father   . Alcohol abuse Father   . Heart disease Mother   . Cancer Mother     lung  . Deep vein thrombosis Mother   . Alcohol abuse Mother   . Heart disease Brother    . Hypertension Brother   . Heart disease Brother   . Hypertension Brother   . Heart disease Brother   . Hypertension Brother    History   Social History  . Marital Status: Divorced    Spouse Name: N/A  . Number of Children: 2  . Years of Education: N/A   Occupational History  . retired    Social History Main Topics  . Smoking status: Never Smoker   . Smokeless tobacco: Never Used  . Alcohol Use: No     Comment: occasional  . Drug Use: No  . Sexual Activity: No   Other Topics Concern  . None   Social History Narrative   Divorced.  Two children.   Orig from [place], [place].   HS education.   Works at [employer] in [place].   No tobacco, occ alcohol, no drugs.   Has 3 brothers and they all smoked, as did her parents.    Physical Exam: Blood pressure 143/92, pulse 81, temperature 97.9 F (36.6 C), temperature source Oral, resp. rate 18, height 5' (1.524 m), weight 147 lb 3.2 oz (66.769 kg). General: Well dressed, well nourished in no apparent distress.    Lungs:  Clear to auscultation bilaterally.  No wheezes.  Cardiovascular:  Regular rate and rhythm.   Abdomen:  Soft, nontender, nondistended.  No palpable masses.  No hepatosplenomegaly.  No ascites. Normal bowel sounds.  No  hernias.  Incisions are well healed.  Genitourinary: Normal EGBUS  Vaginal cuff intact. No visible lesions, atrophic.  No bleeding or discharge.  No masses or nodularity, rectal confirms.  Extremities: No cyanosis, clubbing or edema.   Psychiatric: Mood and affect are appropriate.  Donaciano Eva, MD

## 2014-09-30 ENCOUNTER — Encounter: Payer: Self-pay | Admitting: Family Medicine

## 2014-09-30 ENCOUNTER — Ambulatory Visit (INDEPENDENT_AMBULATORY_CARE_PROVIDER_SITE_OTHER): Payer: 59 | Admitting: Family Medicine

## 2014-09-30 VITALS — BP 131/86 | HR 72 | Temp 98.2°F | Resp 16 | Ht 60.0 in | Wt 148.0 lb

## 2014-09-30 DIAGNOSIS — Z0000 Encounter for general adult medical examination without abnormal findings: Secondary | ICD-10-CM | POA: Diagnosis not present

## 2014-09-30 DIAGNOSIS — Z23 Encounter for immunization: Secondary | ICD-10-CM | POA: Diagnosis not present

## 2014-09-30 LAB — CBC WITH DIFFERENTIAL/PLATELET
Basophils Absolute: 0 10*3/uL (ref 0.0–0.1)
Basophils Relative: 0.5 % (ref 0.0–3.0)
EOS ABS: 0.1 10*3/uL (ref 0.0–0.7)
EOS PCT: 1.7 % (ref 0.0–5.0)
HCT: 43 % (ref 36.0–46.0)
Hemoglobin: 14.1 g/dL (ref 12.0–15.0)
Lymphocytes Relative: 40.4 % (ref 12.0–46.0)
Lymphs Abs: 2.1 10*3/uL (ref 0.7–4.0)
MCHC: 32.8 g/dL (ref 30.0–36.0)
MCV: 91.3 fl (ref 78.0–100.0)
MONO ABS: 0.5 10*3/uL (ref 0.1–1.0)
Monocytes Relative: 9.1 % (ref 3.0–12.0)
NEUTROS PCT: 48.3 % (ref 43.0–77.0)
Neutro Abs: 2.5 10*3/uL (ref 1.4–7.7)
PLATELETS: 284 10*3/uL (ref 150.0–400.0)
RBC: 4.72 Mil/uL (ref 3.87–5.11)
RDW: 13.3 % (ref 11.5–15.5)
WBC: 5.2 10*3/uL (ref 4.0–10.5)

## 2014-09-30 LAB — LIPID PANEL
CHOL/HDL RATIO: 4
Cholesterol: 190 mg/dL (ref 0–200)
HDL: 46.8 mg/dL (ref >39.00)
LDL CALC: 126 mg/dL — AB (ref 0–99)
NonHDL: 143.2
TRIGLYCERIDES: 88 mg/dL (ref 0.0–149.0)
VLDL: 17.6 mg/dL (ref 0.0–40.0)

## 2014-09-30 LAB — COMPREHENSIVE METABOLIC PANEL
ALBUMIN: 4 g/dL (ref 3.5–5.2)
ALT: 16 U/L (ref 0–35)
AST: 17 U/L (ref 0–37)
Alkaline Phosphatase: 60 U/L (ref 39–117)
BUN: 16 mg/dL (ref 6–23)
CHLORIDE: 107 meq/L (ref 96–112)
CO2: 31 meq/L (ref 19–32)
CREATININE: 0.7 mg/dL (ref 0.40–1.20)
Calcium: 9.5 mg/dL (ref 8.4–10.5)
GFR: 89.8 mL/min (ref >60.00)
Glucose, Bld: 94 mg/dL (ref 70–99)
Potassium: 4.6 mEq/L (ref 3.5–5.1)
Sodium: 142 mEq/L (ref 135–145)
Total Bilirubin: 0.5 mg/dL (ref 0.2–1.2)
Total Protein: 6.7 g/dL (ref 6.0–8.3)

## 2014-09-30 LAB — TSH: TSH: 3.49 u[IU]/mL (ref 0.35–4.50)

## 2014-09-30 MED ORDER — FLUTICASONE PROPIONATE 50 MCG/ACT NA SUSP: 2.0000 | Freq: Every day | NASAL | Status: DC

## 2014-09-30 MED ORDER — ZOSTER VACCINE LIVE 19400 UNT/0.65ML ~~LOC~~ SOLR: 0.6500 mL | Freq: Once | SUBCUTANEOUS | Status: DC

## 2014-09-30 MED ORDER — GABAPENTIN 300 MG PO CAPS: ORAL_CAPSULE | ORAL | Status: DC

## 2014-09-30 MED ORDER — TRAMADOL HCL 50 MG PO TABS: ORAL_TABLET | ORAL | Status: DC

## 2014-09-30 NOTE — Progress Notes (Signed)
Pre visit review using our clinic review tool, if applicable. No additional management support is needed unless otherwise documented below in the visit note. 

## 2014-09-30 NOTE — Progress Notes (Signed)
Office Note 09/30/2014  CC:  Chief Complaint  Patient presents with  . Annual Exam    fasting    HPI:  Kelsey Bruce is a 64 y.o. White female who is here for health maintenance exam. Feeling pretty well except she says she has no energy.  Excessively sleepy in afternoon-takes 15 min nap and feels ok after that. Feels constant stuffy nose.  Feels worse when inside.  Has a cat but says she has never been allergic to cats.    She will be getting a L TKR --likely around September (Dr. Gladstone Lighter). She is happily finished with her radiation treatment for endometrial adenocarcinoma.  Gets surveillance q3-4 mo with oncologist.   Past Medical History  Diagnosis Date  . Osteoarthritis     LB, HIPs, knees, hands  . Seasonal allergic rhinitis   . Morton's neuroma 2012    Dr. Roseanne Reno did injections in the past  . BPPV (benign paroxysmal positional vertigo)   . Cerebral palsy   . Endometrial adenocarcinoma 09/2013    Dr. Denman George: pt to get vaginal brachytherapy via rad onc  . Radiation 12/16/13, 12/22/13, 12/30/13, 01/06/14, 01/13/14    proximal vagina 30 gray  . Colon polyp, hyperplastic 2004    Past Surgical History  Procedure Laterality Date  . Dilation and curettage of uterus    . Tonsillectomy    . Colonoscopy  09/2002 (Dr. Henrene Pastor)    hyperplastic; recall sent 2009 but pt apparently didn't respond.  . Tubal ligation    . Robotic assisted total hysterectomy with bilateral salpingo oopherectomy Bilateral 10/05/2013    Procedure: ROBOTIC ASSISTED TOTAL HYSTERECTOMY WITH BILATERAL SALPINGO OOPHORECTOMY WITH LYMPH NODE DISECTION;  Surgeon: Everitt Amber, MD;  Location: [facility];  Service: Gynecology;  Laterality: Bilateral;  . Leg surgery Left 01/2014    "cleaned out cartilage"    Family History  Problem Relation Age of Onset  . Heart disease Father   . Hypertension Father   . Cancer Father     lung  . Arthritis Father   . Alcohol abuse Father   . Heart disease Mother   . Cancer  Mother     lung  . Deep vein thrombosis Mother   . Alcohol abuse Mother   . Heart disease Brother   . Hypertension Brother   . Heart disease Brother   . Hypertension Brother   . Heart disease Brother   . Hypertension Brother     History   Social History  . Marital Status: Divorced    Spouse Name: N/A  . Number of Children: 2  . Years of Education: N/A   Occupational History  . retired    Social History Main Topics  . Smoking status: Never Smoker   . Smokeless tobacco: Never Used  . Alcohol Use: No     Comment: occasional  . Drug Use: No  . Sexual Activity: No   Other Topics Concern  . Not on file   Social History Narrative   Divorced.  Two children.   Orig from [place], [place].   HS education.   Works at [employer] in [place].   No tobacco, occ alcohol, no drugs.   Has 3 brothers and they all smoked, as did her parents.    Outpatient Prescriptions Prior to Visit  Medication Sig Dispense Refill  . beta carotene w/minerals (OCUVITE) tablet Take 1 tablet by mouth daily.    . cetirizine (ZYRTEC) 10 MG tablet Take 10 mg by mouth daily.    Marland Kitchen  docusate sodium (COLACE) 100 MG capsule Take 100 mg by mouth daily as needed.     . meloxicam (MOBIC) 15 MG tablet 1 tab po qd prn arthritis pain 30 tablet 5  . Multiple Vitamin (MULTIVITAMIN) tablet Take 1 tablet by mouth daily.    . polyethylene glycol (MIRALAX / GLYCOLAX) packet Take 17 g by mouth daily as needed.     . traMADol (ULTRAM) 50 MG tablet   0  . HYDROcodone-acetaminophen (NORCO/VICODIN) 5-325 MG per tablet Take 1-2 tablets by mouth every 6 (six) hours as needed for moderate pain. (Patient not taking: Reported on 09/30/2014) 30 tablet 0  . oxyCODONE-acetaminophen (PERCOCET/ROXICET) 5-325 MG per tablet   0   No facility-administered medications prior to visit.    No Known Allergies  ROS Review of Systems  Constitutional: Positive for fatigue. Negative for fever, chills and appetite change.  HENT: Negative for  congestion, dental problem, ear pain and sore throat.   Eyes: Negative for discharge, redness and visual disturbance.  Respiratory: Negative for cough, chest tightness, shortness of breath and wheezing.   Cardiovascular: Negative for chest pain, palpitations and leg swelling.  Gastrointestinal: Negative for nausea, vomiting, abdominal pain, diarrhea and blood in stool.  Genitourinary: Negative for dysuria, urgency, frequency, hematuria, flank pain and difficulty urinating.  Musculoskeletal: Positive for arthralgias. Negative for myalgias, back pain, joint swelling and neck stiffness.  Skin: Negative for pallor and rash.  Neurological: Negative for dizziness, speech difficulty, weakness and headaches.  Hematological: Negative for adenopathy. Does not bruise/bleed easily.  Psychiatric/Behavioral: Negative for confusion and sleep disturbance. The patient is not nervous/anxious.     PE; Blood pressure 131/86, pulse 72, temperature 98.2 F (36.8 C), temperature source Temporal, resp. rate 16, height 5' (1.524 m), weight 148 lb (67.132 kg), SpO2 94 %. Gen: Alert, well appearing.  Patient is oriented to person, place, time, and situation. AFFECT: pleasant, lucid thought and speech. ENT: Ears: EACs clear, normal epithelium.  TMs with good light reflex and landmarks bilaterally.  Eyes: no injection, icteris, swelling, or exudate.  EOMI, PERRLA. Nose: no drainage or turbinate edema/swelling.  No injection or focal lesion.  Mouth: lips without lesion/swelling.  Oral mucosa pink and moist.  Dentition intact and without obvious caries or gingival swelling.  Oropharynx without erythema, exudate, or swelling.  Neck: supple/nontender.  No LAD, mass, or TM.  Carotid pulses 2+ bilaterally, without bruits. CV: RRR, no m/r/g.   LUNGS: CTA bilat, nonlabored resps, good aeration in all lung fields. ABD: soft, NT, ND, BS normal.  No hepatospenomegaly or mass.  No bruits. EXT: no clubbing, cyanosis, or edema.   Musculoskeletal: no joint swelling, erythema, warmth, or tenderness.  ROM of all joints intact. Skin - no sores or suspicious lesions or rashes or color changes  Pertinent labs:  Lab Results  Component Value Date   TSH 1.62 07/29/2012   Lab Results  Component Value Date   WBC 9.5 10/06/2013   HGB 12.5 10/06/2013   HCT 36.8 10/06/2013   MCV 86.6 10/06/2013   PLT 254 10/06/2013   Lab Results  Component Value Date   CREATININE 0.70 10/06/2013   BUN 9 10/06/2013   NA 141 10/06/2013   K 4.2 10/06/2013   CL 105 10/06/2013   CO2 25 10/06/2013   Lab Results  Component Value Date   ALT 21 09/28/2013   AST 23 09/28/2013   ALKPHOS 76 09/28/2013   BILITOT 0.5 09/28/2013   Lab Results  Component Value Date  CHOL 182 07/29/2012   Lab Results  Component Value Date   HDL 44.60 07/29/2012   Lab Results  Component Value Date   LDLCALC 121* 07/29/2012   Lab Results  Component Value Date   TRIG 83.0 07/29/2012   Lab Results  Component Value Date   CHOLHDL 4 07/29/2012   ASSESSMENT AND PLAN:   Health maintenance exam: Reviewed age and gender appropriate health maintenance issues (prudent diet, regular exercise, health risks of tobacco and excessive alcohol, use of seatbelts, fire alarms in home, use of sunscreen).  Also reviewed age and gender appropriate health screening as well as vaccine recommendations. Tdap today. HP labs today. Zostavax rx printed for pt, need for this vaccine discussed.   She is a couple years overdue for repeat screening colonoscopy but wants to continue to put this off until she gets completely over her L knee TKA surgery that is coming up. RF'd tramadol today, and I also rx'd gabapentin 300 mg qhs since she says this helps her a lot with pain and helps her sleep at night. For her allergic rhinitis, I will send in trial of flonase to use 2 sprays each nostril once daily.  Continue prn antihistamine. I think she is probably allergic to her cat,  unfortunately.  An After Visit Summary was printed and given to the patient.  FOLLOW UP:  Return in about 1 year (around 09/30/2015) for annual CPE (fasting).

## 2014-09-30 NOTE — Addendum Note (Signed)
Addended by: Ralph Dowdy on: 09/30/2014 10:24 AM   Modules accepted: Orders

## 2014-10-18 ENCOUNTER — Other Ambulatory Visit: Payer: Self-pay | Admitting: Surgical

## 2014-10-18 MED ORDER — BUPIVACAINE LIPOSOME 1.3 % IJ SUSP: 20.0000 mL | Freq: Once | INTRAMUSCULAR | Status: DC

## 2014-11-01 ENCOUNTER — Telehealth: Payer: Self-pay | Admitting: Family Medicine

## 2014-11-01 ENCOUNTER — Encounter: Payer: Self-pay | Admitting: Family Medicine

## 2014-11-01 NOTE — Telephone Encounter (Signed)
OK, will print letter. 

## 2014-11-01 NOTE — Telephone Encounter (Signed)
Please advise. Thanks.  

## 2014-11-01 NOTE — Telephone Encounter (Signed)
Pt is requesting a release for surgery letter to go to [place] so she can have her surgery Sept 14, 2016.

## 2014-11-02 NOTE — Telephone Encounter (Signed)
Pt advised letter is ready for p/u. Letter put up front for p/u.

## 2014-11-10 ENCOUNTER — Other Ambulatory Visit (HOSPITAL_COMMUNITY)
Admission: RE | Admit: 2014-11-10 | Discharge: 2014-11-10 | Disposition: A | Discharge status: Home / Self Care | Payer: 59 | Source: Ambulatory Visit | Attending: Radiation Oncology | Admitting: Radiation Oncology

## 2014-11-10 ENCOUNTER — Encounter: Payer: Self-pay | Admitting: Radiation Oncology

## 2014-11-10 ENCOUNTER — Ambulatory Visit
Admission: RE | Admit: 2014-11-10 | Discharge: 2014-11-10 | Disposition: A | Discharge status: Home / Self Care | Payer: Medicare Other | Source: Ambulatory Visit | Attending: Radiation Oncology | Admitting: Radiation Oncology

## 2014-11-10 VITALS — BP 154/77 | HR 67 | Temp 97.6°F | Resp 20 | Ht 61.0 in | Wt 149.7 lb

## 2014-11-10 DIAGNOSIS — C541 Malignant neoplasm of endometrium: Secondary | ICD-10-CM

## 2014-11-10 DIAGNOSIS — Z01411 Encounter for gynecological examination (general) (routine) with abnormal findings: Secondary | ICD-10-CM | POA: Insufficient documentation

## 2014-11-10 NOTE — Progress Notes (Signed)
Radiation Oncology         [phone] ________________________________  Name: Kelsey Bruce MRN: [id number]  Date: 11/10/2014  DOB: 05-05-1950  Follow-Up Visit Note  CC: Tammi Sou, MD  Everitt Amber, MD    ICD-9-CM ICD-10-CM   1. Endometrial cancer, grade I 182.0 C54.1     Diagnosis: Kelsey Bruce is a 64 year old female presenting to clinic in regards to her Stage IB Grade II endometrioid adenocarcinoma.   Interval Since Last Radiation:  10  months, she completed vaginal cuff radiation therapy  Narrative:  The patient returns today for routine follow-up appointment with radiation oncology. She denies vaginal bleeding, pelvic pain, rectal bleeding, or blood in the urine. She reports not using the dilator often but does not experience pain or bleeding when using it. She struggles with walking and standing up due to physical complications of her left knee. These complications have become worse since her treatment causing her to occasionally use a cane. Her orthopedic surgeon will perform knee surgery in September of 2016. She is otherwise without complaint and reported attending appointments with Dr. Denman George. The patient projected a healthy mental status but was not accompanied by family for today's radiation oncology appointment.                       ALLERGIES:  has No Known Allergies.  Meds: Current Outpatient Prescriptions  Medication Sig Dispense Refill  . beta carotene w/minerals (OCUVITE) tablet Take 1 tablet by mouth daily.    . cetirizine (ZYRTEC) 10 MG tablet Take 10 mg by mouth daily.    Marland Kitchen docusate sodium (COLACE) 100 MG capsule Take 100 mg by mouth daily as needed.     . fluticasone (FLONASE) 50 MCG/ACT nasal spray Place 2 sprays into both nostrils daily. 16 g 6  . gabapentin (NEURONTIN) 300 MG capsule Take 1 tab po qhs (Patient taking differently: Take 300 mg by mouth daily. Take 1 tab po qhs) 90 capsule 3  . meloxicam (MOBIC) 15 MG tablet 1 tab po qd prn arthritis  pain 30 tablet 5  . Multiple Vitamin (MULTIVITAMIN) tablet Take 1 tablet by mouth daily.    . polyethylene glycol (MIRALAX / GLYCOLAX) packet Take 17 g by mouth daily as needed.     . traMADol (ULTRAM) 50 MG tablet 1-2 tabs po q6h prn 60 tablet 0  . zoster vaccine live, PF, (ZOSTAVAX) 40981 UNT/0.65ML injection Inject 19,400 Units into the skin once. (Patient not taking: Reported on 11/10/2014) 1 vial 0   No current facility-administered medications for this encounter.   Facility-Administered Medications Ordered in Other Encounters  Medication Dose Route Frequency Provider Last Rate Last Dose  . bupivacaine liposome (EXPAREL) 1.3 % injection 266 mg  20 mL Infiltration Once [employer], PA-C        Physical Findings  height is 5\' 1"  (1.549 m) and weight is 149 lb 11.2 oz (67.903 kg). Her oral temperature is 97.6 F (36.4 C). Her blood pressure is 154/77 and her pulse is 67. Her respiration is 20.  The patient's lungs are clear, heart has regular rate and rhythm, and no palpable cervical, supraclavicular, or axillary adenopathy to be noted. The abdomen is soft and nontender with normal bowel sounds. The inguinal areas are free of adenopathy. On pelvic examination the external genitalia are unremarkable. A speculum exam is performed. There are no mucosal lesions noted in the vaginal vault. A Pap smear was obtained of the proximal vagina.  On bimanual and rectovaginal examination there no pelvic masses appreciated. The vaginal vault is significantly narrowed but does permit digital exam. A small speculum was used for the examination.  Lab Findings: Lab Results  Component Value Date   WBC 5.2 09/30/2014   HGB 14.1 09/30/2014   HCT 43.0 09/30/2014   MCV 91.3 09/30/2014   PLT 284.0 09/30/2014    Radiographic Findings: No results found.  Impression: Kelsey Bruce is a 64 year old female presenting to clinic in regards to her Stage IB Grade II endometrioid adenocarcinoma. We discussed that  there was no evidence of reoccurrence of disease to be noted at today's radiation oncology appointment and how to appropriately the provided dilator in management of her disease.  Pap smear pending at this time.  Plan: The patient is advised to continue using her provided dilator as instructed. There was no evidence of reoccurrence of disease to be noted via the pelvic exam and pap smear completed today. She is aware of her knee surgery of the left knee to take place in September of 2016 and of her follow up appointment with Dr. Denman George to take place as schduled. The patient is advised of her follow-up appointment with radiation oncology to take place in six months. All questions and concerns vocalized have been fully addressed. If the patient develops any further questions or concerns, she has been encouraged to contact Dr. Sondra Come, MD, PhD.   This document serves as a record of services personally performed by Gery Pray, MD. It was created on his behalf by Lenn Cal, a trained medical scribe. The creation of this record is based on the scribe's personal observations and the provider's statements to them. This document has been checked and approved by the attending provider.   ____________________________________   Blair Promise, PhD, MD

## 2014-11-10 NOTE — Progress Notes (Addendum)
Follow up s/p rad vaginal f 9/24/9/30 & 10/8/&10/15 30Gy 5/fx No c/o bleeding, discharge, having regular bowel movements, no nausea, no c/o voiding or dysuria,last pap  05/05/14, next follow up with Dr. 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 Latanya Maudlin 10:16 AM BP 154/77 mmHg  Pulse 67  Temp(Src) 97.6 F (36.4 C) (Oral)  Resp 20  Ht 5\' 1"  (1.549 m)  Wt 149 lb 11.2 oz (67.903 kg)  BMI 28.30 kg/m2 Wt Readings from Last 3 Encounters:  11/10/14 149 lb 11.2 oz (67.903 kg)  09/30/14 148 lb (67.132 kg)  08/17/14 147 lb 3.2 oz (66.769 kg)  Paop specimen to be collected by Dr. Sondra Come

## 2014-11-11 LAB — CYTOLOGY - PAP

## 2014-11-16 ENCOUNTER — Telehealth: Payer: Self-pay | Admitting: Oncology

## 2014-11-16 NOTE — Telephone Encounter (Signed)
Called Kelsey Bruce and left her a message regarding her good pap smear results.  Requested a return call.

## 2014-11-29 NOTE — Patient Instructions (Addendum)
YOUR PROCEDURE IS SCHEDULED ON :  12/07/14  REPORT TO [facility] MAIN ENTRANCE FOLLOW SIGNS TO EAST ELEVATOR - GO TO 3rd FLOOR CHECK IN AT 3 EAST NURSES STATION (SHORT STAY) AT:  6:30 AM  CALL THIS NUMBER IF YOU HAVE PROBLEMS THE MORNING OF SURGERY [phone]  REMEMBER:ONLY 1 PER PERSON MAY GO TO SHORT STAY WITH YOU TO GET READY THE MORNING OF YOUR SURGERY  DO NOT EAT FOOD OR DRINK LIQUIDS AFTER MIDNIGHT  TAKE THESE MEDICINES THE MORNING OF SURGERY:MAY TAKE ZYRTEC / FLONASE / TRAMADOL IF NEEDED  YOU MAY NOT HAVE ANY METAL ON YOUR BODY INCLUDING HAIR PINS AND PIERCING'S. DO NOT WEAR JEWELRY, MAKEUP, LOTIONS, POWDERS OR PERFUMES. DO NOT WEAR NAIL POLISH. DO NOT SHAVE 48 HRS PRIOR TO SURGERY. MEN MAY SHAVE FACE AND NECK.  DO NOT [place]. [facility] IS NOT RESPONSIBLE FOR VALUABLES.  CONTACTS, DENTURES OR PARTIALS MAY NOT BE WORN TO SURGERY. LEAVE SUITCASE IN CAR. CAN BE BROUGHT TO ROOM AFTER SURGERY.  PATIENTS DISCHARGED THE DAY OF SURGERY WILL NOT BE ALLOWED TO DRIVE HOME.  PLEASE READ OVER THE FOLLOWING INSTRUCTION SHEETS _________________________________________________________________________________                                          [place] - PREPARING FOR SURGERY  Before surgery, you can play an important role.  Because skin is not sterile, your skin needs to be as free of germs as possible.  You can reduce the number of germs on your skin by washing with CHG (chlorahexidine gluconate) soap before surgery.  CHG is an antiseptic cleaner which kills germs and bonds with the skin to continue killing germs even after washing. Please DO NOT use if you have an allergy to CHG or antibacterial soaps.  If your skin becomes reddened/irritated stop using the CHG and inform your nurse when you arrive at Short Stay. Do not shave (including legs and underarms) for at least 48 hours prior to the first CHG shower.  You may shave your  face. Please follow these instructions carefully:   1.  Shower with CHG Soap the night before surgery and the  morning of Surgery.   2.  If you choose to wash your hair, wash your hair first as usual with your  normal  Shampoo.   3.  After you shampoo, rinse your hair and body thoroughly to remove the  shampoo.                                         4.  Use CHG as you would any other liquid soap.  You can apply chg directly  to the skin and wash . Gently wash with scrungie or clean wascloth    5.  Apply the CHG Soap to your body ONLY FROM THE NECK DOWN.   Do not use on open                           Wound or open sores. Avoid contact with eyes, ears mouth and genitals (private parts).                        Genitals (private  parts) with your normal soap.              6.  Wash thoroughly, paying special attention to the area where your surgery  will be performed.   7.  Thoroughly rinse your body with warm water from the neck down.   8.  DO NOT shower/wash with your normal soap after using and rinsing off  the CHG Soap .                9.  Pat yourself dry with a clean towel.             10.  Wear clean night clothes to bed after shower             11.  Place clean sheets on your bed the night of your first shower and do not  sleep with pets.  Day of Surgery : Do not apply any lotions/deodorants the morning of surgery.  Please wear clean clothes to the hospital/surgery center.  FAILURE TO FOLLOW THESE INSTRUCTIONS MAY RESULT IN THE CANCELLATION OF YOUR SURGERY    PATIENT SIGNATURE_________________________________  ______________________________________________________________________     [name]  An incentive spirometer is a tool that can help keep your lungs clear and active. This tool measures how well you are filling your lungs with each breath. Taking long deep breaths may help reverse or decrease the chance of developing breathing (pulmonary) problems  (especially infection) following:  A long period of time when you are unable to move or be active. BEFORE THE PROCEDURE   If the spirometer includes an indicator to show your best effort, your nurse or respiratory therapist will set it to a desired goal.  If possible, sit up straight or lean slightly forward. Try not to slouch.  Hold the incentive spirometer in an upright position. INSTRUCTIONS FOR USE   Sit on the edge of your bed if possible, or sit up as far as you can in bed or on a chair.  Hold the incentive spirometer in an upright position.  Breathe out normally.  Place the mouthpiece in your mouth and seal your lips tightly around it.  Breathe in slowly and as deeply as possible, raising the piston or the ball toward the top of the column.  Hold your breath for 3-5 seconds or for as long as possible. Allow the piston or ball to fall to the bottom of the column.  Remove the mouthpiece from your mouth and breathe out normally.  Rest for a few seconds and repeat Steps 1 through 7 at least 10 times every 1-2 hours when you are awake. Take your time and take a few normal breaths between deep breaths.  The spirometer may include an indicator to show your best effort. Use the indicator as a goal to work toward during each repetition.  After each set of 10 deep breaths, practice coughing to be sure your lungs are clear. If you have an incision (the cut made at the time of surgery), support your incision when coughing by placing a pillow or rolled up towels firmly against it. Once you are able to get out of bed, walk around indoors and cough well. You may stop using the incentive spirometer when instructed by your caregiver.  RISKS AND COMPLICATIONS  Take your time so you do not get dizzy or light-headed.  If you are in pain, you may need to take or ask for pain medication before doing incentive spirometry. It is harder to  take a deep breath if you are having pain. AFTER  USE  Rest and breathe slowly and easily.  It can be helpful to keep track of a log of your progress. Your caregiver can provide you with a simple table to help with this. If you are using the spirometer at home, follow these instructions: [place] IF:   You are having difficultly using the spirometer.  You have trouble using the spirometer as often as instructed.  Your pain medication is not giving enough relief while using the spirometer.  You develop fever of 100.5 F (38.1 C) or higher. SEEK IMMEDIATE MEDICAL CARE IF:   You cough up bloody sputum that had not been present before.  You develop fever of 102 F (38.9 C) or greater.  You develop worsening pain at or near the incision site. MAKE SURE YOU:   Understand these instructions.  Will watch your condition.  Will get help right away if you are not doing well or get worse. Document Released: 07/22/2006 Document Revised: 06/03/2011 Document Reviewed: 09/22/2006 Exit[employer] Patient Information 2014 [employer], [place].   ________________________________________________________________________  WHAT IS A BLOOD TRANSFUSION? Blood Transfusion Information  A transfusion is the replacement of blood or some of its parts. Blood is made up of multiple cells which provide different functions.  Red blood cells carry oxygen and are used for blood loss replacement.  White blood cells fight against infection.  Platelets control bleeding.  Plasma helps clot blood.  Other blood products are available for specialized needs, such as hemophilia or other clotting disorders. BEFORE THE TRANSFUSION  Who gives blood for transfusions?   Healthy volunteers who are fully evaluated to make sure their blood is safe. This is blood bank blood. Transfusion therapy is the safest it has ever been in the practice of medicine. Before blood is taken from a donor, a complete history is taken to make sure that person has no history of diseases  nor engages in risky social behavior (examples are intravenous drug use or sexual activity with multiple partners). The donor's travel history is screened to minimize risk of transmitting infections, such as malaria. The donated blood is tested for signs of infectious diseases, such as HIV and hepatitis. The blood is then tested to be sure it is compatible with you in order to minimize the chance of a transfusion reaction. If you or a relative donates blood, this is often done in anticipation of surgery and is not appropriate for emergency situations. It takes many days to process the donated blood. RISKS AND COMPLICATIONS Although transfusion therapy is very safe and saves many lives, the main dangers of transfusion include:   Getting an infectious disease.  Developing a transfusion reaction. This is an allergic reaction to something in the blood you were given. Every precaution is taken to prevent this. The decision to have a blood transfusion has been considered carefully by your caregiver before blood is given. Blood is not given unless the benefits outweigh the risks. AFTER THE TRANSFUSION  Right after receiving a blood transfusion, you will usually feel much better and more energetic. This is especially true if your red blood cells have gotten low (anemic). The transfusion raises the level of the red blood cells which carry oxygen, and this usually causes an energy increase.  The nurse administering the transfusion will monitor you carefully for complications. HOME CARE INSTRUCTIONS  No special instructions are needed after a transfusion. You may find your energy is better. Speak with your  caregiver about any limitations on activity for underlying diseases you may have. SEEK MEDICAL CARE IF:   Your condition is not improving after your transfusion.  You develop redness or irritation at the intravenous (IV) site. SEEK IMMEDIATE MEDICAL CARE IF:  Any of the following symptoms occur over the  next 12 hours:  Shaking chills.  You have a temperature by mouth above 102 F (38.9 C), not controlled by medicine.  Chest, back, or muscle pain.  People around you feel you are not acting correctly or are confused.  Shortness of breath or difficulty breathing.  Dizziness and fainting.  You get a rash or develop hives.  You have a decrease in urine output.  Your urine turns a dark color or changes to pink, red, or brown. Any of the following symptoms occur over the next 10 days:  You have a temperature by mouth above 102 F (38.9 C), not controlled by medicine.  Shortness of breath.  Weakness after normal activity.  The white part of the eye turns yellow (jaundice).  You have a decrease in the amount of urine or are urinating less often.  Your urine turns a dark color or changes to pink, red, or brown. Document Released: 03/08/2000 Document Revised: 06/03/2011 Document Reviewed: 10/26/2007 [facility] Patient Information 2014 [place], [place].  _______________________________________________________________________

## 2014-11-30 ENCOUNTER — Encounter (HOSPITAL_COMMUNITY)
Admission: RE | Admit: 2014-11-30 | Discharge: 2014-11-30 | Disposition: A | Discharge status: Home / Self Care | Payer: 59 | Source: Ambulatory Visit | Attending: Orthopedic Surgery | Admitting: Orthopedic Surgery

## 2014-11-30 ENCOUNTER — Encounter (HOSPITAL_COMMUNITY): Payer: Self-pay

## 2014-11-30 ENCOUNTER — Ambulatory Visit (HOSPITAL_COMMUNITY)
Admission: RE | Admit: 2014-11-30 | Discharge: 2014-11-30 | Disposition: A | Discharge status: Home / Self Care | Payer: 59 | Source: Ambulatory Visit | Attending: Surgical | Admitting: Surgical

## 2014-11-30 DIAGNOSIS — Z8542 Personal history of malignant neoplasm of other parts of uterus: Secondary | ICD-10-CM | POA: Insufficient documentation

## 2014-11-30 DIAGNOSIS — Z01818 Encounter for other preprocedural examination: Secondary | ICD-10-CM | POA: Diagnosis present

## 2014-11-30 HISTORY — DX: Sleep disorder, unspecified: G47.9

## 2014-11-30 LAB — CBC WITH DIFFERENTIAL/PLATELET
Basophils Absolute: 0 10*3/uL (ref 0.0–0.1)
Basophils Relative: 1 % (ref 0–1)
Eosinophils Absolute: 0.1 10*3/uL (ref 0.0–0.7)
Eosinophils Relative: 1 % (ref 0–5)
HCT: 43.9 % (ref 36.0–46.0)
Hemoglobin: 14.4 g/dL (ref 12.0–15.0)
Lymphocytes Relative: 39 % (ref 12–46)
Lymphs Abs: 2.5 10*3/uL (ref 0.7–4.0)
MCH: 29.9 pg (ref 26.0–34.0)
MCHC: 32.8 g/dL (ref 30.0–36.0)
MCV: 91.3 fL (ref 78.0–100.0)
Monocytes Absolute: 0.6 10*3/uL (ref 0.1–1.0)
Monocytes Relative: 10 % (ref 3–12)
Neutro Abs: 3.1 10*3/uL (ref 1.7–7.7)
Neutrophils Relative %: 49 % (ref 43–77)
Platelets: 287 10*3/uL (ref 150–400)
RBC: 4.81 MIL/uL (ref 3.87–5.11)
RDW: 12.4 % (ref 11.5–15.5)
WBC: 6.3 10*3/uL (ref 4.0–10.5)

## 2014-11-30 LAB — COMPREHENSIVE METABOLIC PANEL
ALT: 21 U/L (ref 14–54)
AST: 26 U/L (ref 15–41)
Albumin: 4.3 g/dL (ref 3.5–5.0)
Alkaline Phosphatase: 61 U/L (ref 38–126)
Anion gap: 9 (ref 5–15)
BUN: 19 mg/dL (ref 6–20)
CO2: 28 mmol/L (ref 22–32)
Calcium: 9.9 mg/dL (ref 8.9–10.3)
Chloride: 105 mmol/L (ref 101–111)
Creatinine, Ser: 0.78 mg/dL (ref 0.44–1.00)
GFR calc Af Amer: >60 mL/min (ref >60)
GFR calc non Af Amer: >60 mL/min (ref >60)
Glucose, Bld: 88 mg/dL (ref 65–99)
Potassium: 5.3 mmol/L — ABNORMAL HIGH (ref 3.5–5.1)
Sodium: 142 mmol/L (ref 135–145)
Total Bilirubin: 0.5 mg/dL (ref 0.3–1.2)
Total Protein: 7.6 g/dL (ref 6.5–8.1)

## 2014-11-30 LAB — URINALYSIS, ROUTINE W REFLEX MICROSCOPIC
Bilirubin Urine: NEGATIVE
Glucose, UA: NEGATIVE mg/dL
Hgb urine dipstick: NEGATIVE
Ketones, ur: NEGATIVE mg/dL
Leukocytes, UA: NEGATIVE
Nitrite: NEGATIVE
Protein, ur: NEGATIVE mg/dL
Specific Gravity, Urine: 1.009 (ref 1.005–1.030)
Urobilinogen, UA: 0.2 mg/dL (ref 0.0–1.0)
pH: 6 (ref 5.0–8.0)

## 2014-11-30 LAB — APTT: APTT: 33 s (ref 24–37)

## 2014-11-30 LAB — PROTIME-INR
INR: 1.02 (ref 0.00–1.49)
Prothrombin Time: 13.6 seconds (ref 11.6–15.2)

## 2014-11-30 LAB — SURGICAL PCR SCREEN
MRSA, PCR: NEGATIVE
Staphylococcus aureus: NEGATIVE

## 2014-11-30 NOTE — H&P (Signed)
TOTAL KNEE ADMISSION H&P  Patient is being admitted for left total knee arthroplasty.  Subjective:  Chief Complaint:left knee pain.  HPI: Kelsey Bruce, 64 y.o. female, has a history of pain and functional disability in the left knee due to trauma and arthritis and has failed non-surgical conservative treatments for greater than 12 weeks to includeNSAID's and/or analgesics, corticosteriod injections, viscosupplementation injections and activity modification.  Onset of symptoms was gradual, starting 2 years ago with gradually worsening course since that time. The patient noted prior procedures on the knee to include  arthroscopy and menisectomy on the left knee(s).  Patient currently rates pain in the left knee(s) at 7 out of 10 with activity. Patient has night pain, worsening of pain with activity and weight bearing, pain that interferes with activities of daily living, pain with passive range of motion, crepitus and joint swelling.  Patient has evidence of periarticular osteophytes and joint space narrowing by imaging studies.  There is no active infection.  Patient Active Problem List   Diagnosis Date Noted  . Patellofemoral arthralgia of left knee 12/24/2013  . Strain of left knee 12/24/2013  . Osteoarthritis of left knee 12/24/2013  . Endometrial cancer, grade I 09/06/2013  . Rotator cuff tendonitis 04/21/2013  . Arthralgia of foot 10/26/2012  . Cerebral palsy 08/05/2012  . Health maintenance examination 08/05/2012  . Osteoarthritis of multiple joints 07/06/2012  . Seasonal allergic rhinitis 07/06/2012   Past Medical History  Diagnosis Date  . Osteoarthritis     LB, HIPs, knees, hands  . Seasonal allergic rhinitis   . Morton's neuroma 2012    Dr. Roseanne Reno did injections in the past  . BPPV (benign paroxysmal positional vertigo)   . Cerebral palsy   . Endometrial adenocarcinoma 09/2013    Dr. Denman George: pt to get vaginal brachytherapy via rad onc  . Radiation 12/16/13, 12/22/13,  12/30/13, 01/06/14, 01/13/14    proximal vagina 30 gray  . Colon polyp, hyperplastic 2004    Past Surgical History  Procedure Laterality Date  . Dilation and curettage of uterus    . Tonsillectomy    . Colonoscopy  09/2002 (Dr. Henrene Pastor)    hyperplastic; recall sent 2009 but pt apparently didn't respond.  . Tubal ligation    . Robotic assisted total hysterectomy with bilateral salpingo oopherectomy Bilateral 10/05/2013    Procedure: ROBOTIC ASSISTED TOTAL HYSTERECTOMY WITH BILATERAL SALPINGO OOPHORECTOMY WITH LYMPH NODE DISECTION;  Surgeon: Everitt Tennessee Hanlon, MD;  Location: [facility];  Service: Gynecology;  Laterality: Bilateral;  . Leg surgery Left 01/2014    "cleaned out cartilage"     Current outpatient prescriptions:  .  beta carotene w/minerals (OCUVITE) tablet, Take 1 tablet by mouth every morning. , Disp: , Rfl:  .  cetirizine (ZYRTEC) 10 MG tablet, Take 10 mg by mouth daily as needed for allergies. , Disp: , Rfl:  .  docusate sodium (COLACE) 100 MG capsule, Take 100 mg by mouth daily as needed for mild constipation. , Disp: , Rfl:  .  fluticasone (FLONASE) 50 MCG/ACT nasal spray, Place 2 sprays into both nostrils daily. (Patient taking differently: Place 2 sprays into both nostrils daily as needed for allergies or rhinitis. ), Disp: 16 g, Rfl: 6 .  gabapentin (NEURONTIN) 300 MG capsule, Take 1 tab po qhs (Patient taking differently: Take 300 mg by mouth at bedtime. ), Disp: 90 capsule, Rfl: 3 .  meloxicam (MOBIC) 15 MG tablet, 1 tab po qd prn arthritis pain (Patient taking differently: Take 15 mg by  mouth every morning. ), Disp: 30 tablet, Rfl: 5 .  Multiple Vitamin (MULTIVITAMIN) tablet, Take 1 tablet by mouth every morning. , Disp: , Rfl:  .  polyethylene glycol (MIRALAX / GLYCOLAX) packet, Take 17 g by mouth daily as needed for mild constipation. , Disp: , Rfl:  .  Probiotic Product (PROBIOTIC PO), Take 1 tablet by mouth every morning., Disp: , Rfl:  .  traMADol (ULTRAM) 50 MG tablet, 1-2 tabs  po q6h prn (Patient taking differently: Take 50-100 mg by mouth every 6 (six) hours as needed for moderate pain. ), Disp: 60 tablet, Rfl: 0    No Known Allergies  Social History  Substance Use Topics  . Smoking status: Never Smoker   . Smokeless tobacco: Never Used  . Alcohol Use: No     Comment: occasional    Family History  Problem Relation Age of Onset  . Heart disease Father   . Hypertension Father   . Cancer Father     lung  . Arthritis Father   . Alcohol abuse Father   . Heart disease Mother   . Cancer Mother     lung  . Deep vein thrombosis Mother   . Alcohol abuse Mother   . Heart disease Brother   . Hypertension Brother   . Heart disease Brother   . Hypertension Brother   . Heart disease Brother   . Hypertension Brother      Review of Systems  Constitutional: Negative.   HENT: Negative.   Eyes: Negative.   Respiratory: Negative.   Cardiovascular: Positive for chest pain. Negative for palpitations, orthopnea, claudication, leg swelling and PND.  Gastrointestinal: Negative.   Genitourinary: Negative.   Musculoskeletal: Positive for myalgias and joint pain. Negative for back pain, falls and neck pain.  Skin: Negative.   Neurological: Negative.   Endo/Heme/Allergies: Negative.   Psychiatric/Behavioral: Negative.     Objective:  Physical Exam  Constitutional: She is oriented to person, place, and time. She appears well-developed and well-nourished. No distress.  HENT:  Head: Normocephalic and atraumatic.  Right Ear: External ear normal.  Left Ear: External ear normal.  Nose: Nose normal.  Mouth/Throat: Oropharynx is clear and moist.  Eyes: Conjunctivae and EOM are normal.  Neck: Normal range of motion. Neck supple.  Cardiovascular: Normal rate, regular rhythm, normal heart sounds and intact distal pulses.   No murmur heard. Respiratory: Effort normal and breath sounds normal. No respiratory distress. She has no wheezes.  GI: Soft. Bowel sounds are  normal. She exhibits no distension. There is no tenderness.  Musculoskeletal:       Right hip: Normal.       Left hip: Normal.       Right knee: Normal.       Left knee: She exhibits decreased range of motion, swelling and effusion. She exhibits no erythema. Tenderness found. Medial joint line and lateral joint line tenderness noted.  Neurological: She is alert and oriented to person, place, and time. She has normal strength and normal reflexes. No sensory deficit.  Skin: No rash noted. She is not diaphoretic. No erythema.  Psychiatric: She has a normal mood and affect. Her behavior is normal.   Vitals Weight: 145 lb Height: 60in Body Surface Area: 1.63 m Body Mass Index: 28.32 kg/m  Pulse: 84 (Regular)  BP: 150/90 (Sitting, Left Arm, Standard)   Imaging Review Plain radiographs demonstrate severe degenerative joint disease of the left knee(s). The overall alignment ismild varus. The bone quality appears to  be good for age and reported activity level.  Assessment/Plan:  End stage primary osteoarthritis, left knee   The patient history, physical examination, clinical judgment of the provider and imaging studies are consistent with end stage degenerative joint disease of the left knee(s) and total knee arthroplasty is deemed medically necessary. The treatment options including medical management, injection therapy arthroscopy and arthroplasty were discussed at length. The risks and benefits of total knee arthroplasty were presented and reviewed. The risks due to aseptic loosening, infection, stiffness, patella tracking problems, thromboembolic complications and other imponderables were discussed. The patient acknowledged the explanation, agreed to proceed with the plan and consent was signed. Patient is being admitted for inpatient treatment for surgery, pain control, PT, OT, prophylactic antibiotics, VTE prophylaxis, progressive ambulation and ADL's and discharge planning. The  patient is planning to be discharged home with home health services    TXA IV PCP: Dr. Honor Loh, PA-C

## 2014-12-07 ENCOUNTER — Inpatient Hospital Stay (HOSPITAL_COMMUNITY)
Admission: RE | Admit: 2014-12-07 | Discharge: 2014-12-09 | DRG: 470 | Disposition: A | Discharge status: Home Health | Payer: 59 | Source: Ambulatory Visit | Attending: Orthopedic Surgery | Admitting: Orthopedic Surgery

## 2014-12-07 ENCOUNTER — Inpatient Hospital Stay (HOSPITAL_COMMUNITY): Payer: 59 | Admitting: Anesthesiology

## 2014-12-07 ENCOUNTER — Encounter (HOSPITAL_COMMUNITY): Payer: Self-pay | Admitting: *Deleted

## 2014-12-07 ENCOUNTER — Encounter (HOSPITAL_COMMUNITY)
Admission: RE | Disposition: A | Discharge status: Home Health | Payer: Self-pay | Source: Ambulatory Visit | Attending: Orthopedic Surgery

## 2014-12-07 DIAGNOSIS — G809 Cerebral palsy, unspecified: Secondary | ICD-10-CM | POA: Diagnosis present

## 2014-12-07 DIAGNOSIS — Z96659 Presence of unspecified artificial knee joint: Secondary | ICD-10-CM

## 2014-12-07 DIAGNOSIS — Z8542 Personal history of malignant neoplasm of other parts of uterus: Secondary | ICD-10-CM

## 2014-12-07 DIAGNOSIS — M1712 Unilateral primary osteoarthritis, left knee: Secondary | ICD-10-CM | POA: Diagnosis present

## 2014-12-07 DIAGNOSIS — Z79899 Other long term (current) drug therapy: Secondary | ICD-10-CM

## 2014-12-07 DIAGNOSIS — M25562 Pain in left knee: Secondary | ICD-10-CM | POA: Diagnosis present

## 2014-12-07 HISTORY — PX: TOTAL KNEE ARTHROPLASTY: SHX125

## 2014-12-07 LAB — TYPE AND SCREEN
ABO/RH(D): O POS
Antibody Screen: NEGATIVE

## 2014-12-07 SURGERY — ARTHROPLASTY, KNEE, TOTAL
Anesthesia: General | Laterality: Left

## 2014-12-07 MED ORDER — LABETALOL HCL 5 MG/ML IV SOLN
5.0000 mg | INTRAVENOUS | Status: DC | PRN
  Administered 2014-12-07: 5 mg via INTRAVENOUS

## 2014-12-07 MED ORDER — ACETAMINOPHEN 650 MG RE SUPP: 650.0000 mg | Freq: Four times a day (QID) | RECTAL | Status: DC | PRN

## 2014-12-07 MED ORDER — TRANEXAMIC ACID 1000 MG/10ML IV SOLN
1000.0000 mg | INTRAVENOUS | Status: AC
  Administered 2014-12-07: 1000 mg via INTRAVENOUS
  Filled 2014-12-07: qty 10

## 2014-12-07 MED ORDER — METHOCARBAMOL 500 MG PO TABS
500.0000 mg | ORAL_TABLET | Freq: Four times a day (QID) | ORAL | Status: DC | PRN
  Administered 2014-12-07 – 2014-12-08 (×3): 500 mg via ORAL
  Filled 2014-12-07 (×3): qty 1

## 2014-12-07 MED ORDER — MIDAZOLAM HCL 2 MG/2ML IJ SOLN
INTRAMUSCULAR | Status: AC
  Filled 2014-12-07: qty 4

## 2014-12-07 MED ORDER — EPHEDRINE SULFATE 50 MG/ML IJ SOLN
INTRAMUSCULAR | Status: AC
  Filled 2014-12-07: qty 1

## 2014-12-07 MED ORDER — PROPOFOL 10 MG/ML IV BOLUS
INTRAVENOUS | Status: AC
  Filled 2014-12-07: qty 20

## 2014-12-07 MED ORDER — CEFAZOLIN SODIUM 1-5 GM-% IV SOLN
1.0000 g | Freq: Four times a day (QID) | INTRAVENOUS | Status: AC
  Administered 2014-12-07 (×2): 1 g via INTRAVENOUS
  Filled 2014-12-07 (×3): qty 50

## 2014-12-07 MED ORDER — LIDOCAINE HCL (CARDIAC) 20 MG/ML IV SOLN
INTRAVENOUS | Status: DC | PRN
  Administered 2014-12-07: 60 mg via INTRAVENOUS

## 2014-12-07 MED ORDER — SODIUM CHLORIDE 0.9 % IJ SOLN
INTRAMUSCULAR | Status: DC | PRN
  Administered 2014-12-07: 20 mL

## 2014-12-07 MED ORDER — ALUM & MAG HYDROXIDE-SIMETH 200-200-20 MG/5ML PO SUSP: 30.0000 mL | ORAL | Status: DC | PRN

## 2014-12-07 MED ORDER — SUFENTANIL CITRATE 50 MCG/ML IV SOLN
INTRAVENOUS | Status: DC | PRN
  Administered 2014-12-07 (×10): 5 ug via INTRAVENOUS

## 2014-12-07 MED ORDER — MIDAZOLAM HCL 5 MG/5ML IJ SOLN
INTRAMUSCULAR | Status: DC | PRN
  Administered 2014-12-07 (×2): 1 mg via INTRAVENOUS

## 2014-12-07 MED ORDER — CHLORHEXIDINE GLUCONATE 4 % EX LIQD: 60.0000 mL | Freq: Once | CUTANEOUS | Status: DC

## 2014-12-07 MED ORDER — ACETAMINOPHEN 10 MG/ML IV SOLN
1000.0000 mg | Freq: Once | INTRAVENOUS | Status: AC
  Administered 2014-12-07: 1000 mg via INTRAVENOUS

## 2014-12-07 MED ORDER — BUPIVACAINE-EPINEPHRINE (PF) 0.5% -1:200000 IJ SOLN
INTRAMUSCULAR | Status: AC
  Filled 2014-12-07: qty 30

## 2014-12-07 MED ORDER — CELECOXIB 200 MG PO CAPS
200.0000 mg | ORAL_CAPSULE | Freq: Two times a day (BID) | ORAL | Status: DC
  Administered 2014-12-07 – 2014-12-09 (×4): 200 mg via ORAL
  Filled 2014-12-07 (×5): qty 1

## 2014-12-07 MED ORDER — RIVAROXABAN 10 MG PO TABS
10.0000 mg | ORAL_TABLET | Freq: Every day | ORAL | Status: DC
  Administered 2014-12-08 – 2014-12-09 (×2): 10 mg via ORAL
  Filled 2014-12-07 (×3): qty 1

## 2014-12-07 MED ORDER — THROMBIN 5000 UNITS EX SOLR
CUTANEOUS | Status: AC
  Filled 2014-12-07: qty 10000

## 2014-12-07 MED ORDER — KETAMINE HCL 10 MG/ML IJ SOLN
INTRAMUSCULAR | Status: AC
  Filled 2014-12-07: qty 1

## 2014-12-07 MED ORDER — HYDROMORPHONE HCL 1 MG/ML IJ SOLN
0.2500 mg | INTRAMUSCULAR | Status: DC | PRN
  Administered 2014-12-07 (×4): 0.5 mg via INTRAVENOUS

## 2014-12-07 MED ORDER — DEXAMETHASONE SODIUM PHOSPHATE 10 MG/ML IJ SOLN
INTRAMUSCULAR | Status: AC
  Filled 2014-12-07: qty 1

## 2014-12-07 MED ORDER — DEXAMETHASONE SODIUM PHOSPHATE 10 MG/ML IJ SOLN
INTRAMUSCULAR | Status: DC | PRN
  Administered 2014-12-07: 10 mg via INTRAVENOUS

## 2014-12-07 MED ORDER — BISACODYL 5 MG PO TBEC: 5.0000 mg | DELAYED_RELEASE_TABLET | Freq: Every day | ORAL | Status: DC | PRN

## 2014-12-07 MED ORDER — ACETAMINOPHEN 325 MG PO TABS: 650.0000 mg | ORAL_TABLET | Freq: Four times a day (QID) | ORAL | Status: DC | PRN

## 2014-12-07 MED ORDER — PROPOFOL 10 MG/ML IV BOLUS
INTRAVENOUS | Status: DC | PRN
  Administered 2014-12-07: 160 mg via INTRAVENOUS

## 2014-12-07 MED ORDER — LIDOCAINE HCL (CARDIAC) 20 MG/ML IV SOLN
INTRAVENOUS | Status: AC
  Filled 2014-12-07: qty 5

## 2014-12-07 MED ORDER — ONDANSETRON HCL 4 MG/2ML IJ SOLN
INTRAMUSCULAR | Status: AC
  Filled 2014-12-07: qty 2

## 2014-12-07 MED ORDER — BUPIVACAINE LIPOSOME 1.3 % IJ SUSP
20.0000 mL | Freq: Once | INTRAMUSCULAR | Status: AC
  Administered 2014-12-07: 20 mL
  Filled 2014-12-07: qty 20

## 2014-12-07 MED ORDER — ONDANSETRON HCL 4 MG/2ML IJ SOLN
INTRAMUSCULAR | Status: DC | PRN
  Administered 2014-12-07: 4 mg via INTRAVENOUS

## 2014-12-07 MED ORDER — KETAMINE HCL 10 MG/ML IJ SOLN
INTRAMUSCULAR | Status: DC | PRN
  Administered 2014-12-07: 10 mg via INTRAVENOUS
  Administered 2014-12-07: 20 mg via INTRAVENOUS
  Administered 2014-12-07 (×2): 10 mg via INTRAVENOUS

## 2014-12-07 MED ORDER — SODIUM CHLORIDE 0.9 % IJ SOLN
INTRAMUSCULAR | Status: AC
  Filled 2014-12-07: qty 10

## 2014-12-07 MED ORDER — ONDANSETRON HCL 4 MG/2ML IJ SOLN
4.0000 mg | Freq: Four times a day (QID) | INTRAMUSCULAR | Status: DC | PRN
  Administered 2014-12-07: 4 mg via INTRAVENOUS
  Filled 2014-12-07: qty 2

## 2014-12-07 MED ORDER — ONDANSETRON HCL 4 MG PO TABS: 4.0000 mg | ORAL_TABLET | Freq: Four times a day (QID) | ORAL | Status: DC | PRN

## 2014-12-07 MED ORDER — ONDANSETRON HCL 4 MG/2ML IJ SOLN: 4.0000 mg | Freq: Once | INTRAMUSCULAR | Status: DC | PRN

## 2014-12-07 MED ORDER — LACTATED RINGERS IV SOLN
INTRAVENOUS | Status: DC
Start: 2014-12-07 — End: 2014-12-09
  Administered 2014-12-07 – 2014-12-08 (×2): via INTRAVENOUS

## 2014-12-07 MED ORDER — BUPIVACAINE HCL (PF) 0.25 % IJ SOLN
INTRAMUSCULAR | Status: AC
  Filled 2014-12-07: qty 30

## 2014-12-07 MED ORDER — BUPIVACAINE HCL (PF) 0.25 % IJ SOLN
INTRAMUSCULAR | Status: DC | PRN
  Administered 2014-12-07: 20 mL

## 2014-12-07 MED ORDER — BUPIVACAINE-EPINEPHRINE (PF) 0.5% -1:200000 IJ SOLN
INTRAMUSCULAR | Status: DC | PRN
  Administered 2014-12-07: 30 mL via PERINEURAL

## 2014-12-07 MED ORDER — DEXTROSE 5 % IV SOLN
500.0000 mg | Freq: Four times a day (QID) | INTRAVENOUS | Status: DC | PRN
  Administered 2014-12-07: 500 mg via INTRAVENOUS
  Filled 2014-12-07 (×2): qty 5

## 2014-12-07 MED ORDER — MENTHOL 3 MG MT LOZG
1.0000 | LOZENGE | OROMUCOSAL | Status: DC | PRN
Start: 2014-12-07 — End: 2014-12-09

## 2014-12-07 MED ORDER — OXYCODONE-ACETAMINOPHEN 5-325 MG PO TABS: 2.0000 | ORAL_TABLET | ORAL | Status: DC | PRN

## 2014-12-07 MED ORDER — SODIUM CHLORIDE 0.9 % IR SOLN
Status: AC
  Filled 2014-12-07: qty 1

## 2014-12-07 MED ORDER — FLEET ENEMA 7-19 GM/118ML RE ENEM: 1.0000 | ENEMA | Freq: Once | RECTAL | Status: DC | PRN

## 2014-12-07 MED ORDER — SUFENTANIL CITRATE 50 MCG/ML IV SOLN
INTRAVENOUS | Status: AC
Start: 2014-12-07 — End: 2014-12-07
  Filled 2014-12-07: qty 1

## 2014-12-07 MED ORDER — LACTATED RINGERS IV SOLN
INTRAVENOUS | Status: DC
  Administered 2014-12-07 (×2): via INTRAVENOUS

## 2014-12-07 MED ORDER — EPHEDRINE SULFATE 50 MG/ML IJ SOLN
INTRAMUSCULAR | Status: DC | PRN
  Administered 2014-12-07: 5 mg via INTRAVENOUS

## 2014-12-07 MED ORDER — CEFAZOLIN SODIUM-DEXTROSE 2-3 GM-% IV SOLR
2.0000 g | INTRAVENOUS | Status: AC
  Administered 2014-12-07: 2 g via INTRAVENOUS

## 2014-12-07 MED ORDER — CEFAZOLIN SODIUM-DEXTROSE 2-3 GM-% IV SOLR
INTRAVENOUS | Status: AC
  Filled 2014-12-07: qty 50

## 2014-12-07 MED ORDER — POLYETHYLENE GLYCOL 3350 17 G PO PACK: 17.0000 g | PACK | Freq: Every day | ORAL | Status: DC | PRN

## 2014-12-07 MED ORDER — PHENOL 1.4 % MT LIQD: 1.0000 | OROMUCOSAL | Status: DC | PRN

## 2014-12-07 MED ORDER — HYDROMORPHONE HCL 1 MG/ML IJ SOLN
1.0000 mg | INTRAMUSCULAR | Status: DC | PRN
Start: 2014-12-07 — End: 2014-12-09

## 2014-12-07 MED ORDER — SODIUM CHLORIDE 0.9 % IR SOLN
Status: DC | PRN
  Administered 2014-12-07: 500 mL

## 2014-12-07 MED ORDER — CISATRACURIUM BESYLATE 20 MG/10ML IV SOLN
INTRAVENOUS | Status: AC
  Filled 2014-12-07: qty 10

## 2014-12-07 MED ORDER — FERROUS SULFATE 325 (65 FE) MG PO TABS
325.0000 mg | ORAL_TABLET | Freq: Three times a day (TID) | ORAL | Status: DC
  Administered 2014-12-08 – 2014-12-09 (×4): 325 mg via ORAL
  Filled 2014-12-07 (×9): qty 1

## 2014-12-07 MED ORDER — HYDROCODONE-ACETAMINOPHEN 5-325 MG PO TABS
1.0000 | ORAL_TABLET | ORAL | Status: DC | PRN
  Administered 2014-12-07 – 2014-12-08 (×3): 1 via ORAL
  Administered 2014-12-08 (×3): 2 via ORAL
  Administered 2014-12-08 (×2): 1 via ORAL
  Administered 2014-12-09 (×2): 2 via ORAL
  Filled 2014-12-07: qty 2
  Filled 2014-12-07 (×2): qty 1
  Filled 2014-12-07: qty 2
  Filled 2014-12-07: qty 1
  Filled 2014-12-07 (×4): qty 2
  Filled 2014-12-07: qty 1

## 2014-12-07 MED ORDER — MEPERIDINE HCL 50 MG/ML IJ SOLN: 6.2500 mg | INTRAMUSCULAR | Status: DC | PRN

## 2014-12-07 MED ORDER — SODIUM CHLORIDE 0.9 % IJ SOLN
INTRAMUSCULAR | Status: AC
  Filled 2014-12-07: qty 20

## 2014-12-07 MED ORDER — HYDROMORPHONE HCL 1 MG/ML IJ SOLN
INTRAMUSCULAR | Status: AC
  Filled 2014-12-07: qty 1

## 2014-12-07 MED ORDER — ACETAMINOPHEN 10 MG/ML IV SOLN
INTRAVENOUS | Status: AC
  Filled 2014-12-07: qty 100

## 2014-12-07 MED ORDER — GABAPENTIN 300 MG PO CAPS
300.0000 mg | ORAL_CAPSULE | Freq: Every day | ORAL | Status: DC
  Administered 2014-12-07 – 2014-12-08 (×2): 300 mg via ORAL
  Filled 2014-12-07 (×3): qty 1

## 2014-12-07 MED ORDER — LABETALOL HCL 5 MG/ML IV SOLN
INTRAVENOUS | Status: AC
  Filled 2014-12-07: qty 4

## 2014-12-07 SURGICAL SUPPLY — 77 items
BAG DECANTER FOR FLEXI CONT (MISCELLANEOUS) IMPLANT
BAG SPEC THK2 15X12 ZIP CLS (MISCELLANEOUS)
BAG ZIPLOCK 12X15 (MISCELLANEOUS) IMPLANT
BANDAGE ELASTIC 4 VELCRO ST LF (GAUZE/BANDAGES/DRESSINGS) ×3 IMPLANT
BANDAGE ELASTIC 6 VELCRO ST LF (GAUZE/BANDAGES/DRESSINGS) ×3 IMPLANT
BANDAGE ESMARK 6X9 LF (GAUZE/BANDAGES/DRESSINGS) ×1 IMPLANT
BLADE SAG 18X100X1.27 (BLADE) ×3 IMPLANT
BLADE SAW SGTL 11.0X1.19X90.0M (BLADE) ×3 IMPLANT
BNDG CMPR 9X6 STRL LF SNTH (GAUZE/BANDAGES/DRESSINGS) ×1
BNDG ESMARK 6X9 LF (GAUZE/BANDAGES/DRESSINGS) ×3
BONE CEMENT GENTAMICIN (Cement) ×6 IMPLANT
CAP KNEE TOTAL 3 SIGMA ×2 IMPLANT
CEMENT BONE GENTAMICIN 40 (Cement) ×2 IMPLANT
CUFF TOURN SGL QUICK 34 (TOURNIQUET CUFF) ×3
CUFF TRNQT CYL 34X4X40X1 (TOURNIQUET CUFF) ×1 IMPLANT
DECANTER SPIKE VIAL GLASS SM (MISCELLANEOUS) ×3 IMPLANT
DRAPE EXTREMITY T 121X128X90 (DRAPE) ×3 IMPLANT
DRAPE INCISE IOBAN 66X45 STRL (DRAPES) ×2 IMPLANT
DRAPE POUCH INSTRU U-SHP 10X18 (DRAPES) ×3 IMPLANT
DRAPE SHEET LG 3/4 BI-LAMINATE (DRAPES) ×3 IMPLANT
DRAPE U-SHAPE 47X51 STRL (DRAPES) ×3 IMPLANT
DRSG AQUACEL AG ADV 3.5X10 (GAUZE/BANDAGES/DRESSINGS) ×3 IMPLANT
DRSG PAD ABDOMINAL 8X10 ST (GAUZE/BANDAGES/DRESSINGS) ×2 IMPLANT
DRSG TEGADERM 4X4.75 (GAUZE/BANDAGES/DRESSINGS) ×3 IMPLANT
DURAPREP 26ML APPLICATOR (WOUND CARE) ×3 IMPLANT
ELECT REM PT RETURN 9FT ADLT (ELECTROSURGICAL) ×3
ELECTRODE REM PT RTRN 9FT ADLT (ELECTROSURGICAL) ×1 IMPLANT
EVACUATOR 1/8 PVC DRAIN (DRAIN) ×3 IMPLANT
FACESHIELD WRAPAROUND (MASK) ×15 IMPLANT
FACESHIELD WRAPAROUND OR TEAM (MASK) ×5 IMPLANT
GAUZE SPONGE 2X2 8PLY STRL LF (GAUZE/BANDAGES/DRESSINGS) ×1 IMPLANT
GLOVE BIOGEL PI IND STRL 6.5 (GLOVE) ×1 IMPLANT
GLOVE BIOGEL PI IND STRL 8 (GLOVE) ×1 IMPLANT
GLOVE BIOGEL PI INDICATOR 6.5 (GLOVE) ×2
GLOVE BIOGEL PI INDICATOR 8 (GLOVE) ×2
GLOVE ECLIPSE 8.0 STRL XLNG CF (GLOVE) ×6 IMPLANT
GLOVE SURG SS PI 6.5 STRL IVOR (GLOVE) ×3 IMPLANT
GOWN STRL REUS W/TWL LRG LVL3 (GOWN DISPOSABLE) ×3 IMPLANT
GOWN STRL REUS W/TWL XL LVL3 (GOWN DISPOSABLE) ×3 IMPLANT
HANDPIECE INTERPULSE COAX TIP (DISPOSABLE) ×3
IMMOBILIZER KNEE 20 (SOFTGOODS) ×3
IMMOBILIZER KNEE 20 THIGH 36 (SOFTGOODS) ×1 IMPLANT
KIT BASIN OR (CUSTOM PROCEDURE TRAY) ×3 IMPLANT
LIQUID BAND (GAUZE/BANDAGES/DRESSINGS) ×3 IMPLANT
MANIFOLD NEPTUNE II (INSTRUMENTS) ×3 IMPLANT
NDL SAFETY ECLIPSE 18X1.5 (NEEDLE) ×2 IMPLANT
NEEDLE HYPO 18GX1.5 SHARP (NEEDLE) ×6
NEEDLE HYPO 22GX1.5 SAFETY (NEEDLE) ×3 IMPLANT
NS IRRIG 1000ML POUR BTL (IV SOLUTION) IMPLANT
PACK TOTAL JOINT (CUSTOM PROCEDURE TRAY) ×3 IMPLANT
PADDING CAST COTTON 6X4 STRL (CAST SUPPLIES) IMPLANT
PEN SKIN MARKING BROAD (MISCELLANEOUS) ×3 IMPLANT
POSITIONER SURGICAL ARM (MISCELLANEOUS) ×3 IMPLANT
SET HNDPC FAN SPRY TIP SCT (DISPOSABLE) ×1 IMPLANT
SET PAD KNEE POSITIONER (MISCELLANEOUS) ×3 IMPLANT
SPONGE GAUZE 2X2 STER 10/PKG (GAUZE/BANDAGES/DRESSINGS) ×2
SPONGE LAP 18X18 X RAY DECT (DISPOSABLE) IMPLANT
SPONGE SURGIFOAM ABS GEL 100 (HEMOSTASIS) ×3 IMPLANT
STAPLER VISISTAT 35W (STAPLE) IMPLANT
SUCTION FRAZIER 12FR DISP (SUCTIONS) ×3 IMPLANT
SUT BONE WAX W31G (SUTURE) ×3 IMPLANT
SUT MNCRL AB 4-0 PS2 18 (SUTURE) ×3 IMPLANT
SUT VIC AB 1 CT1 27 (SUTURE) ×6
SUT VIC AB 1 CT1 27XBRD ANTBC (SUTURE) ×2 IMPLANT
SUT VIC AB 2-0 CT1 27 (SUTURE) ×9
SUT VIC AB 2-0 CT1 TAPERPNT 27 (SUTURE) ×3 IMPLANT
SUT VLOC 180 0 24IN GS25 (SUTURE) ×3 IMPLANT
SYR 20CC LL (SYRINGE) ×6 IMPLANT
SYR 50ML LL SCALE MARK (SYRINGE) ×3 IMPLANT
TOWEL OR 17X26 10 PK STRL BLUE (TOWEL DISPOSABLE) ×3 IMPLANT
TOWEL OR NON WOVEN STRL DISP B (DISPOSABLE) IMPLANT
TOWER CARTRIDGE SMART MIX (DISPOSABLE) ×3 IMPLANT
TRAY FOLEY W/METER SILVER 14FR (SET/KITS/TRAYS/PACK) ×3 IMPLANT
TRAY FOLEY W/METER SILVER 16FR (SET/KITS/TRAYS/PACK) ×3 IMPLANT
WATER STERILE IRR 1500ML POUR (IV SOLUTION) ×3 IMPLANT
WRAP KNEE MAXI GEL POST OP (GAUZE/BANDAGES/DRESSINGS) ×3 IMPLANT
YANKAUER SUCT BULB TIP 10FT TU (MISCELLANEOUS) ×3 IMPLANT

## 2014-12-07 NOTE — Interval H&P Note (Signed)
History and Physical Interval Note:  12/07/2014 8:28 AM  Kelsey Bruce  has presented today for surgery, with the diagnosis of OA LEFT KNEE  The various methods of treatment have been discussed with the patient and family. After consideration of risks, benefits and other options for treatment, the patient has consented to  Procedure(s): TOTAL KNEE ARTHROPLASTY (Left) as a surgical intervention .  The patient's history has been reviewed, patient examined, no change in status, stable for surgery.  I have reviewed the patient's chart and labs.  Questions were answered to the patient's satisfaction.     Courtney Fenlon A

## 2014-12-07 NOTE — Progress Notes (Signed)
Utilization review completed.  

## 2014-12-07 NOTE — Anesthesia Postprocedure Evaluation (Signed)
Anesthesia Post Note  Patient: Kelsey Bruce  Procedure(s) Performed: Procedure(s) (LRB): TOTAL KNEE ARTHROPLASTY (Left)  Anesthesia type: general  Patient location: PACU  Post pain: Pain level controlled  Post assessment: Patient's Cardiovascular Status Stable  Last Vitals:  Filed Vitals:   12/07/14 1545  BP: 147/82  Pulse: 77  Temp: 36.7 C  Resp: 16    Post vital signs: Reviewed and stable  Level of consciousness: sedated  Complications: No apparent anesthesia complications

## 2014-12-07 NOTE — Anesthesia Preprocedure Evaluation (Signed)
Anesthesia Evaluation  Patient identified by MRN, date of birth, ID band Patient awake    Reviewed: Allergy & Precautions, NPO status , Patient's Chart, lab work & pertinent test results  Airway Mallampati: I  TM Distance: >3 FB Neck ROM: Full    Dental   Pulmonary    Pulmonary exam normal        Cardiovascular Normal cardiovascular exam     Neuro/Psych    GI/Hepatic   Endo/Other    Renal/GU      Musculoskeletal   Abdominal   Peds  Hematology   Anesthesia Other Findings   Reproductive/Obstetrics                             Anesthesia Physical Anesthesia Plan  ASA: II  Anesthesia Plan: General   Post-op Pain Management:    Induction: Intravenous  Airway Management Planned: Oral ETT  Additional Equipment:   Intra-op Plan:   Post-operative Plan: Extubation in OR  Informed Consent: I have reviewed the patients History and Physical, chart, labs and discussed the procedure including the risks, benefits and alternatives for the proposed anesthesia with the patient or authorized representative who has indicated his/her understanding and acceptance.     Plan Discussed with: CRNA and Surgeon  Anesthesia Plan Comments:         Anesthesia Quick Evaluation  

## 2014-12-07 NOTE — Progress Notes (Signed)
AssistedDr. Ossey with left, ultrasound guided, adductor canal block. Side rails up, monitors on throughout procedure. See vital signs in flow sheet. Tolerated Procedure well.  

## 2014-12-07 NOTE — Anesthesia Procedure Notes (Addendum)
Anesthesia Regional Block:  Adductor canal block  Pre-Anesthetic Checklist: ,, timeout performed, Correct Patient, Correct Site, Correct Laterality, Correct Procedure, Correct Position, site marked, Risks and benefits discussed,  Surgical consent,  Pre-op evaluation,  At surgeon's request and post-op pain management  Laterality: Left  Prep: chloraprep       Needles:  Injection technique: Single-shot  Needle Type: Echogenic Stimulator Needle     Needle Length: 9cm 9 cm Needle Gauge: 21 and 21 G    Additional Needles:  Procedures: ultrasound guided (picture in chart) Adductor canal block Narrative:  Start time: 12/07/2014 7:15 AM End time: 12/07/2014 7:25 AM Injection made incrementally with aspirations every 5 mL.  Performed by: Personally  Anesthesiologist: Lillia Abed  Additional Notes: Monitors applied. Patient sedated. Sterile prep and drape,hand hygiene and sterile gloves were used. Relevant anatomy identified.Needle position confirmed.Local anesthetic injected incrementally after negative aspiration. Local anesthetic spread visualized around nerve(s). Vascular puncture avoided. No complications. Image printed for medical record.The patient tolerated the procedure well.    Lillia Abed MD   Procedure Name: LMA Insertion Date/Time: 12/07/2014 8:45 AM Performed by: Sharlette Dense Patient Re-evaluated:Patient Re-evaluated prior to inductionOxygen Delivery Method: Circle system utilized Preoxygenation: Pre-oxygenation with 100% oxygen Intubation Type: IV induction Ventilation: Mask ventilation without difficulty LMA: LMA inserted LMA Size: 3.0 Number of attempts: 1 Placement Confirmation: positive ETCO2 and breath sounds checked- equal and bilateral Tube secured with: Tape Dental Injury: Teeth and Oropharynx as per pre-operative assessment

## 2014-12-07 NOTE — Brief Op Note (Signed)
12/07/2014  10:16 AM  PATIENT:  Kelsey Bruce  64 y.o. female  PRE-OPERATIVE DIAGNOSIS:Primary  OA LEFT KNEE  POST-OPERATIVE DIAGNOSIS:Primary  OA LEFT KNEE  PROCEDURE:  Procedure(s): TOTAL KNEE ARTHROPLASTY (Left)  SURGEON:  Surgeon(s) and Role:    * Latanya Maudlin, MD - Primary  PHYSICIAN ASSISTANT: Ardeen Jourdain PA  ASSISTANTS: Ardeen Jourdain PA  ANESTHESIA:   general  EBL:  Total I/O In: 1000 [I.V.:1000] Out: 400 [Urine:350; Blood:50]  BLOOD ADMINISTERED:none  DRAINS: (one) Hemovact drain(s) in the Left Knee with  Suction Open   LOCAL MEDICATIONS USED:  MARCAINE 20cc of 0.25%Plain and 20cc of Exparel mixed with 20cc of Normal Saline.    SPECIMEN:  No Specimen  DISPOSITION OF SPECIMEN:  N/A  COUNTS:  YES  TOURNIQUET:  * Missing tourniquet times found for documented tourniquets in log:  [id number] *  DICTATION: .Other Dictation: Dictation Number [id number]  PLAN OF CARE: Admit to inpatient   PATIENT DISPOSITION:  Stable in OR   Delay start of Pharmacological VTE agent (>24hrs) due to surgical blood loss or risk of bleeding: yes

## 2014-12-07 NOTE — Interval H&P Note (Signed)
History and Physical Interval Note:  12/07/2014 8:28 AM  Kelsey Bruce  has presented today for surgery, with the diagnosis of OA LEFT KNEE  The various methods of treatment have been discussed with the patient and family. After consideration of risks, benefits and other options for treatment, the patient has consented to  Procedure(s): TOTAL KNEE ARTHROPLASTY (Left) as a surgical intervention .  The patient's history has been reviewed, patient examined, no change in status, stable for surgery.  I have reviewed the patient's chart and labs.  Questions were answered to the patient's satisfaction.     Twilia Yaklin A

## 2014-12-07 NOTE — Transfer of Care (Signed)
Immediate Anesthesia Transfer of Care Note  Patient: Kelsey Bruce  Procedure(s) Performed: Procedure(s): TOTAL KNEE ARTHROPLASTY (Left)  Patient Location: PACU  Anesthesia Type:General  Level of Consciousness: awake  Airway & Oxygen Therapy: Patient Spontanous Breathing and Patient connected to face mask oxygen  Post-op Assessment: Report given to RN and Post -op Vital signs reviewed and stable  Post vital signs: Reviewed and stable  Last Vitals:  Filed Vitals:   12/07/14 0647  BP: 151/84  Pulse: 71  Temp: 36.3 C  Resp: 18    Complications: No apparent anesthesia complications

## 2014-12-08 LAB — CBC
HCT: 37.3 % (ref 36.0–46.0)
Hemoglobin: 12.6 g/dL (ref 12.0–15.0)
MCH: 29.6 pg (ref 26.0–34.0)
MCHC: 33.8 g/dL (ref 30.0–36.0)
MCV: 87.8 fL (ref 78.0–100.0)
PLATELETS: 235 10*3/uL (ref 150–400)
RBC: 4.25 MIL/uL (ref 3.87–5.11)
RDW: 12.4 % (ref 11.5–15.5)
WBC: 10.9 10*3/uL — AB (ref 4.0–10.5)

## 2014-12-08 LAB — BASIC METABOLIC PANEL
Anion gap: 7 (ref 5–15)
BUN: 9 mg/dL (ref 6–20)
CALCIUM: 9 mg/dL (ref 8.9–10.3)
CO2: 27 mmol/L (ref 22–32)
CREATININE: 0.6 mg/dL (ref 0.44–1.00)
Chloride: 107 mmol/L (ref 101–111)
Glucose, Bld: 152 mg/dL — ABNORMAL HIGH (ref 65–99)
Potassium: 4.1 mmol/L (ref 3.5–5.1)
SODIUM: 141 mmol/L (ref 135–145)

## 2014-12-08 NOTE — Progress Notes (Signed)
Subjective: 1 Day Post-Op Procedure(s) (LRB): TOTAL KNEE ARTHROPLASTY (Left) Patient reports pain as 1 on 0-10 scale Doing very well. Hemovac DCd. Plan on DC tomorrow..    Objective: Vital signs in last 24 hours: Temp:  [97.8 F (36.6 C)-98.4 F (36.9 C)] 97.8 F (36.6 C) (09/15 0605) Pulse Rate:  [77-108] 81 (09/15 0605) Resp:  [14-17] 15 (09/15 0605) BP: (108-179)/(65-107) 122/72 mmHg (09/15 0605) SpO2:  [96 %-99 %] 97 % (09/15 0605) Weight:  [67.586 kg (149 lb)] 67.586 kg (149 lb) (09/14 0745)  Intake/Output from previous day: 09/14 0701 - 09/15 0700 In: 2591.7 [P.O.:360; I.V.:2021.7; IV Piggyback:210] Out: 7824 [Urine:3625; Drains:30; Blood:50] Intake/Output this shift:     Recent Labs  12/08/14 0515  HGB 12.6    Recent Labs  12/08/14 0515  WBC 10.9*  RBC 4.25  HCT 37.3  PLT 235    Recent Labs  12/08/14 0515  NA 141  K 4.1  CL 107  CO2 27  BUN 9  CREATININE 0.60  GLUCOSE 152*  CALCIUM 9.0   No results for input(s): LABPT, INR in the last 72 hours.  Dorsiflexion/Plantar flexion intact No cellulitis present Compartment soft  Assessment/Plan: 1 Day Post-Op Procedure(s) (LRB): TOTAL KNEE ARTHROPLASTY (Left) Up with therapy discontinued plans for tomorrow.  Crysten Kaman A 12/08/2014, 7:15 AM

## 2014-12-08 NOTE — Op Note (Signed)
NAMESUMMIT, BORCHARDT NO.:  [id number]  MEDICAL RECORD NO.:  [id number]  LOCATION:  2440                         FACILITY:  [facility]  PHYSICIAN:  Kipp Brood. [place] Delauder, M.D.DATE OF BIRTH:  29-Jul-1950  DATE OF PROCEDURE:  12/07/2014 DATE OF DISCHARGE:                              OPERATIVE REPORT   SURGEON:  Kipp Brood. Gladstone Lighter, M.D.  OPERATIVE ASSISTANT:  Ardeen Jourdain, P.A.  PREOPERATIVE DIAGNOSIS:  Severe primary osteoarthritis of the left knee with bone on bone.  POSTOPERATIVE DIAGNOSIS:  Severe primary osteoarthritis of the left knee with bone on bone.  OPERATION:  Left total knee arthroplasty utilizing DePuy system.  The femoral component that was used was a size 2.5 left posterior cruciate sacrificing femoral component.  The tibial tray was a size 2, insert was a size 2.5, 10 mm in thickness rotating platform polyethylene.  The patella was a size 32.  Note, all 3 components were cemented and gentamicin was used in the cement.  DESCRIPTION OF PROCEDURE:  The sterile prepping and draping of the left lower extremity was carried out.  The appropriate time-out was first carried out for the left knee.  I also marked the left knee in the holding area.  At this time, the leg was exsanguinated with Esmarch, tourniquet was elevated at 300 mmHg.  The knee was flexed, placed in a DeMayo knee holder.  An anterior approach to the knee was carried out. Two flaps were created.  I then carried out a median parapatellar incision.  The patella was reflected laterally and I then, with the knee flexed, did medial and lateral meniscectomies and excised the anterior and posterior cruciate ligaments.  I then made my initial drill hole in the intercondylar notch of the left femur.  I then inserted the canal finder, irrigated out the canal after I removed it, and then removed 11 mm thickness off the distal femur.  At that time, we then measured the femur to be a size 2.5.  We did  the appropriate anterior and posterior chamfering cuts for a size 2.5 left femoral component.  Next, attention was directed to the tibia, the tibial tray, as I mentioned, size 2.  I then made my initial drill hole in the tibial plateau and following that, we then removed 6 mm thickness off the affected medial side of the tibia.  We then went on and inserted the lamina spreaders, removed the posterior femoral spurs.  We irrigated out the wound and then inserted our spacer blocks and a nice fit, forward flexion and extension with 10 mm thickness block.  Following that, we continued to prepare the tibia. We cut our keel cut out of the proximal tibial plateau.  We then did our notch cut out of the distal femur.  We inserted our trial components and had good flexion and extension stability to the knee.  We then did a resurfacing procedure on the patella in the usual fashion for a size 32 patella.  The drill holes then were made in the articular surface of the patella.  All 3 components were removed.  We thoroughly water picked out the knee, dried the knee out, and cemented all  3 components in simultaneously with gentamicin in the cement.  Note, preop, the patient had tranexamic acid and 2 g of IV Ancef.  Following that, after we dried the knee out and all the cement was hardened, we then removed all loose pieces of cement, water picked out the knee, made sure all loose pieces of cement were removed.  We inserted our permanent prosthesis rotating platform size 10 mm thickness, size 2.5.  Reduced the knee and had excellent function.  Note, we then inserted a Hemovac drain and closed the knee in layers in usual fashion.  Midway in the procedure, I injected 20 mL of 0.25% Marcaine plain.  At the end of procedure, we injected a mixture of Exparel 20 mL mixed with 20 mL of saline.  The knee then, as I mentioned, was closed over Hemovac drain.  Sterile dressings were applied.           ______________________________ Kipp Brood Gladstone Lighter, M.D.     RAG/MEDQ  D:  12/07/2014  T:  12/08/2014  Job:  [id number]

## 2014-12-08 NOTE — Evaluation (Signed)
Occupational Therapy Evaluation Patient Details Name: Kelsey Bruce MRN: [id number] DOB: [birth year] Today's Date: 12/08/2014    History of Present Illness 64 yo female s/p L TKA   Clinical Impression   Patient presenting with deconditioning and decreased ADL, functional mobility independence secondary to above. Patient independent PTA. Patient currently functioning at an overall supervision>min assist level Patient will benefit from acute OT to increase overall independence in the areas of ADLs, functional mobility, and overall safety in order to safely discharge home with assistance from daughter.     Follow Up Recommendations  No OT follow up;Supervision/Assistance - 24 hour    Equipment Recommendations  3 in 1 bedside comode    Recommendations for Other Services  None at this time    Precautions / Restrictions Precautions Precautions: Knee Precaution Comments: reviewed no pillow under knee  Required Braces or Orthoses: Knee Immobilizer - Left Knee Immobilizer - Left: Discontinue once straight leg raise with < 10 degree lag Restrictions Weight Bearing Restrictions: Yes LLE Weight Bearing: Weight bearing as tolerated    Mobility Bed Mobility Overal bed mobility: Needs Assistance Bed Mobility: Supine to Sit     Supine to sit: Min assist;Mod assist     General bed mobility comments: Pt recieved seated in recliner upon OT entering room, left pt seated in recliner upon exiting  Transfers Overall transfer level: Needs assistance Equipment used: Rolling walker (2 wheeled) Transfers: Sit to/from Stand Sit to Stand: Min assist         General transfer comment: Min assist to power into standing. Cues for hand placement and technique for management/positioning of LLE.     Balance Overall balance assessment: Needs assistance Sitting-balance support: No upper extremity supported;Feet supported Sitting balance-Leahy Scale: Fair     Standing balance support: Bilateral  upper extremity supported;During functional activity Standing balance-Leahy Scale: Fair    ADL Overall ADL's : Needs assistance/impaired General ADL Comments: Pt unable to reach LLE for LB ADLs with KI donned. Pt reports she believes she will be able to reach without KI. Will plan to practice this tomorrow. Pt stood from recliner and ambulated into BR for toilet transfer using BSC over elevated toilet seat. Pt with minimal complaints of lightheadedness, but pt with shaky type movements and stated this was not normal for her; anxiety? Pt stood at sink to wash hands, then ambulated back to recliner.     Pertinent Vitals/Pain Pain Assessment: 0-10 Pain Score: 3  Pain Location: left knee Pain Descriptors / Indicators: Aching;Sore Pain Intervention(s): Monitored during session;Repositioned;Limited activity within patient's tolerance;Ice applied     Hand Dominance Right   Extremity/Trunk Assessment Upper Extremity Assessment Upper Extremity Assessment: Overall WFL for tasks assessed   Lower Extremity Assessment Lower Extremity Assessment: Defer to PT evaluation LLE Deficits / Details: 3-/5 quads with AAROM at knee -10- 60   Cervical / Trunk Assessment Cervical / Trunk Assessment: Normal   Communication Communication Communication: No difficulties   Cognition Arousal/Alertness: Awake/alert Behavior During Therapy: Anxious Overall Cognitive Status: Within Functional Limits for tasks assessed              Home Living Family/patient expects to be discharged to:: Private residence Living Arrangements: Alone Available Help at Discharge: Family;Available 24 hours/day (daughter) Type of Home: House Home Access: Stairs to enter [employer] of Steps: 2 Entrance Stairs-Rails: Left Home Layout: One level     Bathroom Shower/Tub: Corporate investment banker: Standard     Home Equipment: None  Prior Functioning/Environment Level of Independence:  Independent     OT Diagnosis: Generalized weakness;Acute pain   OT Problem List: Decreased strength;Decreased activity tolerance;Impaired balance (sitting and/or standing);Decreased range of motion;Decreased safety awareness;Pain;Decreased knowledge of use of DME or AE;Decreased knowledge of precautions   OT Treatment/Interventions: Self-care/ADL training;Therapeutic exercise;Energy conservation;DME and/or AE instruction;Patient/family education;Balance training    OT Goals(Current goals can be found in the care plan section) Acute Rehab OT Goals Patient Stated Goal: go home tomorrow OT Goal Formulation: With patient Time For Goal Achievement: 12/22/14 Potential to Achieve Goals: Good ADL Goals Pt Will Perform Grooming: with modified independence;standing Pt Will Perform Lower Body Bathing: with modified independence;sit to/from stand Pt Will Perform Lower Body Dressing: with modified independence;sit to/from stand Pt Will Transfer to Toilet: with modified independence;ambulating;bedside commode Pt Will Perform Tub/Shower Transfer: Tub transfer;ambulating;rolling walker;shower seat;with modified independence Additional ADL Goal #1: Pt will be mod I with functional mobility/ambulation using RW  OT Frequency: Min 2X/week   Barriers to D/C: None known at this time, pt reports her daughter plans to stay with her for the next 2 weeks prn   End of Session Equipment Utilized During Treatment: Rolling walker;Left knee immobilizer  Activity Tolerance: Patient tolerated treatment well Patient left: in chair;with call bell/phone within reach   Time: 1155-1217 OT Time Calculation (min): 22 min Charges:  OT General Charges $OT Visit: 1 Procedure OT Evaluation $Initial OT Evaluation Tier I: 1 Procedure  Zierra Laroque , MS, OTR/L, CLT Pager: [phone]  12/08/2014, 1:07 PM

## 2014-12-08 NOTE — Evaluation (Signed)
Physical Therapy Evaluation Patient Details Name: Kelsey Bruce MRN: [id number] DOB: 10/25/1950 Today's Date: 12/08/2014   History of Present Illness  64 yo female s/p L TKA  Clinical Impression  Pt s/p L TKR presents with decreased L LE strength/ROM and post op pain limiting functional mobility.  Pt should progress to dc home with family assist and HHPT follow up.    Follow Up Recommendations Home health PT    Equipment Recommendations  None recommended by PT    Recommendations for Other Services OT consult     Precautions / Restrictions Precautions Precautions: Knee Precaution Comments: reviewed no pillow under knee  Required Braces or Orthoses: Knee Immobilizer - Left Knee Immobilizer - Left: Discontinue once straight leg raise with < 10 degree lag Restrictions Weight Bearing Restrictions: Yes LLE Weight Bearing: Weight bearing as tolerated      Mobility  Bed Mobility Overal bed mobility: Needs Assistance Bed Mobility: Supine to Sit     Supine to sit: Min assist;Mod assist     General bed mobility comments: cues for sequence and use of R LE to self assist  Transfers Overall transfer level: Needs assistance Equipment used: Rolling walker (2 wheeled) Transfers: Sit to/from Stand Sit to Stand: Min assist;Mod assist         General transfer comment: cues for LE management and use of UEs to self assist  Ambulation/Gait Ambulation/Gait assistance: Min assist;Mod assist Ambulation Distance (Feet): 27 Feet Assistive device: Rolling walker (2 wheeled) Gait Pattern/deviations: Step-to pattern;Decreased step length - right;Decreased step length - left;Shuffle;Trunk flexed     General Gait Details: cues for posture, sequence and position from [employer]            Wheelchair Mobility    Modified Rankin (Stroke Patients Only)       Balance                                             Pertinent Vitals/Pain Pain Assessment:  0-10 Pain Score: 3  Pain Location: left knee Pain Descriptors / Indicators: Aching;Sore Pain Intervention(s): Monitored during session;Repositioned;Limited activity within patient's tolerance;Ice applied    Home Living Family/patient expects to be discharged to:: Private residence Living Arrangements: Alone Available Help at Discharge: Family;Available 24 hours/day (daughter) Type of Home: House Home Access: Stairs to enter Entrance Stairs-Rails: Left Entrance Stairs-Number of Steps: 2 Home Layout: One level Home Equipment: None      Prior Function Level of Independence: Independent               Hand Dominance   Dominant Hand: Right    Extremity/Trunk Assessment   Upper Extremity Assessment: Overall WFL for tasks assessed           Lower Extremity Assessment: Defer to PT evaluation   LLE Deficits / Details: 3-/5 quads with AAROM at knee -10- 60  Cervical / Trunk Assessment: Normal  Communication   Communication: No difficulties  Cognition Arousal/Alertness: Awake/alert Behavior During Therapy: Anxious Overall Cognitive Status: Within Functional Limits for tasks assessed                      General Comments      Exercises Total Joint Exercises Ankle Circles/Pumps: AROM;Both;15 reps;Supine Quad Sets: AROM;Both;10 reps;Supine Heel Slides: AAROM;Left;15 reps;Supine Hip ABduction/ADduction: AAROM;Left;10 reps;Supine      Assessment/Plan    PT  Assessment Patient needs continued PT services  PT Diagnosis Difficulty walking   PT Problem List Decreased strength;Decreased range of motion;Decreased activity tolerance;Decreased mobility;Decreased knowledge of use of DME;Pain  PT Treatment Interventions DME instruction;Gait training;Stair training;Functional mobility training;Therapeutic activities;Therapeutic exercise;Patient/family education   PT Goals (Current goals can be found in the Care Plan section) Acute Rehab PT Goals Patient Stated Goal:  Resume previous lifestyle with decreased pain PT Goal Formulation: With patient Time For Goal Achievement: 12/13/14 Potential to Achieve Goals: Good    Frequency 7X/week   Barriers to discharge        Co-evaluation               End of Session Equipment Utilized During Treatment: Left knee immobilizer Activity Tolerance: Patient tolerated treatment well Patient left: in chair;with call bell/phone within reach Nurse Communication: Mobility status         Time: 7342-8768 PT Time Calculation (min) (ACUTE ONLY): 38 min   Charges:   PT Evaluation $Initial PT Evaluation Tier I: 1 Procedure PT Treatments $Gait Training: 8-22 mins $Therapeutic Exercise: 8-22 mins   PT G Codes:        Piper Hassebrock 2014/12/20, 1:03 PM

## 2014-12-08 NOTE — Progress Notes (Signed)
Physical Therapy Treatment Patient Details Name: Kelsey Bruce MRN: [id number] DOB: December 06, 1950 Today's Date: 12/08/2014    History of Present Illness 64 yo female s/p L TKA    PT Comments    Pt progressing steadily with mobility and hopeful for dc tomorrow.  Follow Up Recommendations  Home health PT     Equipment Recommendations  None recommended by PT    Recommendations for Other Services OT consult     Precautions / Restrictions Precautions Precautions: Knee;Fall Knee Immobilizer - Left: Discontinue once straight leg raise with < 10 degree lag Restrictions Weight Bearing Restrictions: No LLE Weight Bearing: Weight bearing as tolerated    Mobility  Bed Mobility Overal bed mobility: Needs Assistance Bed Mobility: Sit to Supine       Sit to supine: Min assist   General bed mobility comments: cues for sequence and use of R LE to self assist  Transfers Overall transfer level: Needs assistance Equipment used: Rolling walker (2 wheeled) Transfers: Sit to/from Stand Sit to Stand: Min assist         General transfer comment: Min assist to power into standing. Cues for hand placement and technique for management/positioning of LLE.   Ambulation/Gait Ambulation/Gait assistance: Min assist Ambulation Distance (Feet): 75 Feet Assistive device: Rolling walker (2 wheeled) Gait Pattern/deviations: Step-to pattern;Decreased step length - right;Decreased step length - left;Shuffle;Trunk flexed Gait velocity: decr   General Gait Details: Increased time with cues for posture, sequence and position from [employer] Mobility    Modified Rankin (Stroke Patients Only)       Balance Overall balance assessment: Needs assistance Sitting-balance support: No upper extremity supported;Feet supported Sitting balance-Leahy Scale: Fair     Standing balance support: Bilateral upper extremity supported;During functional activity Standing  balance-Leahy Scale: Fair                      Cognition Arousal/Alertness: Awake/alert Behavior During Therapy: WFL for tasks assessed/performed Overall Cognitive Status: Within Functional Limits for tasks assessed                      Exercises Total Joint Exercises Ankle Circles/Pumps: AROM;Both;15 reps;Supine Quad Sets: AROM;Both;10 reps;Supine Heel Slides: AAROM;Left;15 reps;Supine Straight Leg Raises: AAROM;10 reps;Supine;Left    General Comments        Pertinent Vitals/Pain Pain Assessment: 0-10 Pain Score: 5  Pain Location: L knee Pain Descriptors / Indicators: Aching;Sore Pain Intervention(s): Limited activity within patient's tolerance;Monitored during session;Premedicated before session;Ice applied    Home Living                      Prior Function            PT Goals (current goals can now be found in the care plan section) Acute Rehab PT Goals Patient Stated Goal: go home tomorrow PT Goal Formulation: With patient Time For Goal Achievement: 12/13/14 Potential to Achieve Goals: Good Progress towards PT goals: Progressing toward goals    Frequency  7X/week    PT Plan Current plan remains appropriate    Co-evaluation             End of Session Equipment Utilized During Treatment: Left knee immobilizer Activity Tolerance: Patient tolerated treatment well Patient left: in bed;with call bell/phone within reach;with family/visitor present     Time: 1450-1530 PT Time Calculation (min) (ACUTE ONLY): 40 min  Charges:  $Gait Training: 23-37 mins $Therapeutic Exercise: 8-22 mins                    G Codes:      Kelsey Bruce Dec 16, 2014, 4:18 PM

## 2014-12-09 LAB — BASIC METABOLIC PANEL
ANION GAP: 7 (ref 5–15)
BUN: 14 mg/dL (ref 6–20)
CALCIUM: 8.6 mg/dL — AB (ref 8.9–10.3)
CO2: 26 mmol/L (ref 22–32)
CREATININE: 0.63 mg/dL (ref 0.44–1.00)
Chloride: 107 mmol/L (ref 101–111)
GLUCOSE: 129 mg/dL — AB (ref 65–99)
Potassium: 3.8 mmol/L (ref 3.5–5.1)
Sodium: 140 mmol/L (ref 135–145)

## 2014-12-09 LAB — CBC
HEMATOCRIT: 35.5 % — AB (ref 36.0–46.0)
Hemoglobin: 11.7 g/dL — ABNORMAL LOW (ref 12.0–15.0)
MCH: 29.3 pg (ref 26.0–34.0)
MCHC: 33 g/dL (ref 30.0–36.0)
MCV: 89 fL (ref 78.0–100.0)
PLATELETS: 197 10*3/uL (ref 150–400)
RBC: 3.99 MIL/uL (ref 3.87–5.11)
RDW: 12.7 % (ref 11.5–15.5)
WBC: 8.5 10*3/uL (ref 4.0–10.5)

## 2014-12-09 MED ORDER — ONDANSETRON HCL 4 MG PO TABS: 4.0000 mg | ORAL_TABLET | Freq: Four times a day (QID) | ORAL | Status: DC | PRN

## 2014-12-09 MED ORDER — METHOCARBAMOL 500 MG PO TABS: 500.0000 mg | ORAL_TABLET | Freq: Four times a day (QID) | ORAL | Status: DC | PRN

## 2014-12-09 MED ORDER — RIVAROXABAN 10 MG PO TABS: 10.0000 mg | ORAL_TABLET | Freq: Every day | ORAL | Status: DC

## 2014-12-09 MED ORDER — HYDROCODONE-ACETAMINOPHEN 5-325 MG PO TABS: 1.0000 | ORAL_TABLET | ORAL | Status: DC | PRN

## 2014-12-09 MED ORDER — TRAMADOL HCL 50 MG PO TABS: 50.0000 mg | ORAL_TABLET | Freq: Four times a day (QID) | ORAL | Status: DC | PRN

## 2014-12-09 NOTE — Plan of Care (Signed)
Problem: Phase III Progression Outcomes Goal: Anticoagulant follow-up in place Outcome: Not Applicable Date Met:  23/30/07 Xarelto VTE, no f/u needed.

## 2014-12-09 NOTE — Progress Notes (Signed)
   Subjective: 2 Days Post-Op Procedure(s) (LRB): TOTAL KNEE ARTHROPLASTY (Left) Patient reports pain as mild.   Patient seen in rounds for Dr. Gladstone Lighter Patient is well, and has had no acute complaints or problems. She says that she slept well last night. Voiding well. Positive flatus. No SOB or chest pain. Reports that therapy is going well.    Objective: Vital signs in last 24 hours: Temp:  [97.6 F (36.4 C)-98.2 F (36.8 C)] 98.2 F (36.8 C) (09/16 0520) Pulse Rate:  [84-93] 88 (09/16 0520) Resp:  [15-16] 16 (09/16 0520) BP: (138-156)/(66-75) 156/72 mmHg (09/16 0520) SpO2:  [91 %-95 %] 91 % (09/16 0520)  Intake/Output from previous day:  Intake/Output Summary (Last 24 hours) at 12/09/14 0725 Last data filed at 12/09/14 0521  Gross per 24 hour  Intake 2281.67 ml  Output   2450 ml  Net -168.33 ml     Labs:  Recent Labs  12/08/14 0515 12/09/14 0455  HGB 12.6 11.7*    Recent Labs  12/08/14 0515 12/09/14 0455  WBC 10.9* 8.5  RBC 4.25 3.99  HCT 37.3 35.5*  PLT 235 197    Recent Labs  12/08/14 0515 12/09/14 0455  NA 141 140  K 4.1 3.8  CL 107 107  CO2 27 26  BUN 9 14  CREATININE 0.60 0.63  GLUCOSE 152* 129*  CALCIUM 9.0 8.6*    EXAM General - Patient is Alert and Oriented Extremity - Neurologically intact Sensation intact distally Intact pulses distally No cellulitis present Compartment soft Dressing/Incision - clean, dry, no drainage Motor Function - intact, moving foot and toes well on exam.   Past Medical History  Diagnosis Date  . Osteoarthritis     LB, HIPs, knees, hands  . Seasonal allergic rhinitis   . Cerebral palsy   . Endometrial adenocarcinoma 09/2013    Dr. Denman George: pt to get vaginal brachytherapy via rad onc  . Radiation 12/16/13, 12/22/13, 12/30/13, 01/06/14, 01/13/14    proximal vagina 30 gray  . Colon polyp, hyperplastic 2004  . Morton's neuroma 2012    Dr. Roseanne Reno did injections in the past (left foot)  . Difficulty sleeping       Assessment/Plan: 2 Days Post-Op Procedure(s) (LRB): TOTAL KNEE ARTHROPLASTY (Left) Active Problems:   History of total knee arthroplasty  Estimated body mass index is 25.56 kg/(m^2) as calculated from the following:   Height as of this encounter: 5\' 4"  (1.626 m).   Weight as of this encounter: 67.586 kg (149 lb). Advance diet Up with therapy D/C IV fluids Discharge home with home health  DVT Prophylaxis - Xarelto Weight-Bearing as tolerated   She is doing great. Will continue PT today and DC home with HHPT. [place] instructions written and discussed.   Ardeen Jourdain, PA-C Orthopaedic Surgery 12/09/2014, 7:25 AM

## 2014-12-09 NOTE — Care Management Note (Signed)
Case Management Note  Patient Details  Name: Kelsey Bruce MRN: [id number] Date of Birth: May 01, 1950  Subjective/Objective:        s/p L TKA            Action/Plan: Discharge planning, patient has [facility] prearranged with [facility]. Contacted [facility] to confirm. Requested 3-in-1 for home use, has RW.   Expected Discharge Date:  12/09/14               Expected Discharge Plan:  [place]  In-House Referral:  NA  Discharge planning Services  CM Consult  Post Acute Care Choice:  Home Health Choice offered to:  Patient  DME Arranged:  3-N-1 DME Agency:  [place]:  PT [facility] Agency:  [place]  Status of Service:  Completed, signed off  Medicare Important Message Given:    Date Medicare IM Given:    Medicare IM give by:    Date Additional Medicare IM Given:    Additional Medicare Important Message give by:     If discussed at [place] of Stay Meetings, dates discussed:    Additional Comments:  Guadalupe Maple, RN 12/09/2014, 10:34 AM

## 2014-12-09 NOTE — Progress Notes (Signed)
Physical Therapy Treatment Patient Details Name: Kelsey Bruce MRN: [id number] DOB: 20-Nov-1950 Today's Date: 12/09/2014    History of Present Illness 64 yo female s/p L TKA    PT Comments    Pt eager for dc.  Reviewed home therex and stairs with dtr present.  Follow Up Recommendations  Home health PT     Equipment Recommendations  None recommended by PT    Recommendations for Other Services OT consult     Precautions / Restrictions Precautions Precautions: Knee;Fall Restrictions Weight Bearing Restrictions: No LLE Weight Bearing: Weight bearing as tolerated    Mobility  Bed Mobility Overal bed mobility: Needs Assistance Bed Mobility: Sit to Supine     Supine to sit: Supervision Sit to supine: Min assist   General bed mobility comments: cues for sequence with min assist for L LE   Transfers Overall transfer level: Needs assistance Equipment used: Rolling walker (2 wheeled) Transfers: Sit to/from Stand Sit to Stand: Min guard         General transfer comment: Min guard for safety. Cues for hand placement and technique for management/positioning of LLE.   Ambulation/Gait Ambulation/Gait assistance: Min guard Ambulation Distance (Feet): 38 Feet Assistive device: Rolling walker (2 wheeled) Gait Pattern/deviations: Step-to pattern;Decreased step length - right;Decreased step length - left;Shuffle;Trunk flexed Gait velocity: decr   General Gait Details: Increased time with cues for posture, stride length, sequence and position from RW   Stairs Stairs: Yes Stairs assistance: Min assist Stair Management: One rail Right;Step to pattern;Forwards;With cane Number of Stairs: 5 General stair comments: cues for sequence and foot/QC placement; dtr present and observiing  Wheelchair Mobility    Modified Rankin (Stroke Patients Only)       Balance Overall balance assessment: Needs assistance Sitting-balance support: No upper extremity supported;Feet  supported Sitting balance-Leahy Scale: Fair     Standing balance support: Bilateral upper extremity supported;During functional activity Standing balance-Leahy Scale: Fair                      Cognition Arousal/Alertness: Awake/alert Behavior During Therapy: WFL for tasks assessed/performed;Flat affect Overall Cognitive Status: Within Functional Limits for tasks assessed                      Exercises      General Comments        Pertinent Vitals/Pain Pain Assessment: 0-10 Pain Score: 3  Faces Pain Scale: Hurts little more Pain Location: L knee Pain Descriptors / Indicators: Aching;Sore Pain Intervention(s): Limited activity within patient's tolerance;Monitored during session;Premedicated before session;Ice applied    Home Living                      Prior Function            PT Goals (current goals can now be found in the care plan section) Acute Rehab PT Goals Patient Stated Goal: go home PT Goal Formulation: With patient Time For Goal Achievement: 12/13/14 Potential to Achieve Goals: Good Progress towards PT goals: Progressing toward goals    Frequency  7X/week    PT Plan Current plan remains appropriate    Co-evaluation             End of Session Equipment Utilized During Treatment: Gait belt Activity Tolerance: Patient tolerated treatment well Patient left: in bed;with call bell/phone within reach;with family/visitor present     Time: 3419-3790 PT Time Calculation (min) (ACUTE ONLY): 23 min  Charges:  $  Gait Training: 8-22 mins $Therapeutic Activity: 8-22 mins                    G Codes:      Kelsey Bruce 15-Dec-2014, 12:48 PM

## 2014-12-09 NOTE — Discharge Instructions (Signed)
INSTRUCTIONS AFTER JOINT REPLACEMENT  ° °o Remove items at home which could result in a fall. This includes throw rugs or furniture in walking pathways °o ICE to the affected joint every three hours while awake for 30 minutes at a time, for at least the first 3-5 days, and then as needed for pain and swelling.  Continue to use ice for pain and swelling. You may notice swelling that will progress down to the foot and ankle.  This is normal after surgery.  Elevate your leg when you are not up walking on it.   °o Continue to use the breathing machine you got in the hospital (incentive spirometer) which will help keep your temperature down.  It is common for your temperature to cycle up and down following surgery, especially at night when you are not up moving around and exerting yourself.  The breathing machine keeps your lungs expanded and your temperature down. ° ° °DIET:  As you were doing prior to hospitalization, we recommend a well-balanced diet. ° °DRESSING / WOUND CARE / SHOWERING ° °Keep the surgical dressing until follow up.  The dressing is water proof, so you can shower without any extra covering.  IF THE DRESSING FALLS OFF or the wound gets wet inside, change the dressing with sterile gauze.  Please use good hand washing techniques before changing the dressing.  Do not use any lotions or creams on the incision until instructed by your surgeon.   ° °ACTIVITY ° °o Increase activity slowly as tolerated, but follow the weight bearing instructions below.   °o No driving for 6 weeks or until further direction given by your physician.  You cannot drive while taking narcotics.  °o No lifting or carrying greater than 10 lbs. until further directed by your surgeon. °o Avoid periods of inactivity such as sitting longer than an hour when not asleep. This helps prevent blood clots.  °o You may return to work once you are authorized by your doctor.  ° ° ° °WEIGHT BEARING  ° °Weight bearing as tolerated with assist  device (walker, cane, etc) as directed, use it as long as suggested by your surgeon or therapist, typically at least 4-6 weeks. ° ° °EXERCISES ° °Results after joint replacement surgery are often greatly improved when you follow the exercise, range of motion and muscle strengthening exercises prescribed by your doctor. Safety measures are also important to protect the joint from further injury. Any time any of these exercises cause you to have increased pain or swelling, decrease what you are doing until you are comfortable again and then slowly increase them. If you have problems or questions, call your caregiver or physical therapist for advice.  ° °Rehabilitation is important following a joint replacement. After just a few days of immobilization, the muscles of the leg can become weakened and shrink (atrophy).  These exercises are designed to build up the tone and strength of the thigh and leg muscles and to improve motion. Often times heat used for twenty to thirty minutes before working out will loosen up your tissues and help with improving the range of motion but do not use heat for the first two weeks following surgery (sometimes heat can increase post-operative swelling).  ° °These exercises can be done on a training (exercise) mat, on the floor, on a table or on a bed. Use whatever works the best and is most comfortable for you.    Use music or television while you are exercising so that   the exercises are a pleasant break in your day. This will make your life better with the exercises acting as a break in your routine that you can look forward to.   Perform all exercises about fifteen times, three times per day or as directed.  You should exercise both the operative leg and the other leg as well. ° °Exercises include: °  °• Quad Sets - Tighten up the muscle on the front of the thigh (Quad) and hold for 5-10 seconds.   °• Straight Leg Raises - With your knee straight (if you were given a brace, keep it on),  lift the leg to 60 degrees, hold for 3 seconds, and slowly lower the leg.  Perform this exercise against resistance later as your leg gets stronger.  °• Leg Slides: Lying on your back, slowly slide your foot toward your buttocks, bending your knee up off the floor (only go as far as is comfortable). Then slowly slide your foot back down until your leg is flat on the floor again.  °• Angel Wings: Lying on your back spread your legs to the side as far apart as you can without causing discomfort.  °• Hamstring Strength:  Lying on your back, push your heel against the floor with your leg straight by tightening up the muscles of your buttocks.  Repeat, but this time bend your knee to a comfortable angle, and push your heel against the floor.  You may put a pillow under the heel to make it more comfortable if necessary.  ° °A rehabilitation program following joint replacement surgery can speed recovery and prevent re-injury in the future due to weakened muscles. Contact your doctor or a physical therapist for more information on knee rehabilitation.  ° ° °CONSTIPATION ° °Constipation is defined medically as fewer than three stools per week and severe constipation as less than one stool per week.  Even if you have a regular bowel pattern at home, your normal regimen is likely to be disrupted due to multiple reasons following surgery.  Combination of anesthesia, postoperative narcotics, change in appetite and fluid intake all can affect your bowels.  ° °YOU MUST use at least one of the following options; they are listed in order of increasing strength to get the job done.  They are all available over the counter, and you may need to use some, POSSIBLY even all of these options:   ° °Drink plenty of fluids (prune juice may be helpful) and high fiber foods °Colace 100 mg by mouth twice a day  °Senokot for constipation as directed and as needed Dulcolax (bisacodyl), take with full glass of water  °Miralax (polyethylene glycol)  once or twice a day as needed. ° °If you have tried all these things and are unable to have a bowel movement in the first 3-4 days after surgery call either your surgeon or your primary doctor.   ° °If you experience loose stools or diarrhea, hold the medications until you stool forms back up.  If your symptoms do not get better within 1 week or if they get worse, check with your doctor.  If you experience "the worst abdominal pain ever" or develop nausea or vomiting, please contact the office immediately for further recommendations for treatment. ° ° °ITCHING:  If you experience itching with your medications, try taking only a single pain pill, or even half a pain pill at a time.  You can also use Benadryl over the counter for itching or also to   help with sleep.   TED HOSE STOCKINGS:  Use stockings on both legs until for at least 2 weeks or as directed by physician office. They may be removed at night for sleeping.  MEDICATIONS:  See your medication summary on the After Visit Summary that nursing will review with you.  You may have some home medications which will be placed on hold until you complete the course of blood thinner medication.  It is important for you to complete the blood thinner medication as prescribed.  PRECAUTIONS:  If you experience chest pain or shortness of breath - call 911 immediately for transfer to the hospital emergency department.   If you develop a fever greater that 101 F, purulent drainage from wound, increased redness or drainage from wound, foul odor from the wound/dressing, or calf pain - CONTACT YOUR SURGEON.                                                   FOLLOW-UP APPOINTMENTS:  If you do not already have a post-op appointment, please call the office for an appointment to be seen by your surgeon.  Guidelines for how soon to be seen are listed in your After Visit Summary, but are typically between 1-4 weeks after surgery.    MAKE SURE YOU:   Understand these  instructions.   Get help right away if you are not doing well or get worse.    Thank you for letting us be a part of your medical care team.  It is a privilege we respect greatly.  We hope these instructions will help you stay on track for a fast and full recovery!    Information on my medicine - XARELTO (Rivaroxaban)  This medication education was reviewed with me or my healthcare representative as part of my discharge preparation.  The pharmacist that spoke with me during my hospital stay was:  Lolita Patella, [facility]  Why was Xarelto prescribed for you? Xarelto was prescribed for you to reduce the risk of blood clots forming after orthopedic surgery. The medical term for these abnormal blood clots is venous thromboembolism (VTE).  What do you need to know about xarelto ? Take your Xarelto ONCE DAILY at the same time every day. You may take it either with or without food.  If you have difficulty swallowing the tablet whole, you may crush it and mix in applesauce just prior to taking your dose.  Take Xarelto exactly as prescribed by your doctor and DO NOT stop taking Xarelto without talking to the doctor who prescribed the medication.  Stopping without other VTE prevention medication to take the place of Xarelto may increase your risk of developing a clot.  After discharge, you should have regular check-up appointments with your healthcare provider that is prescribing your Xarelto.    What do you do if you miss a dose? If you miss a dose, take it as soon as you remember on the same day then continue your regularly scheduled once daily regimen the next day. Do not take two doses of Xarelto on the same day.   Important Safety Information A possible side effect of Xarelto is bleeding. You should call your healthcare provider right away if you experience any of the following: ? Bleeding from an injury or your nose that does not stop. ? Unusual colored urine (  red or dark  brown) or unusual colored stools (red or black). ? Unusual bruising for unknown reasons. ? A serious fall or if you hit your head (even if there is no bleeding).  Some medicines may interact with Xarelto and might increase your risk of bleeding while on Xarelto. To help avoid this, consult your healthcare provider or pharmacist prior to using any new prescription or non-prescription medications, including herbals, vitamins, non-steroidal anti-inflammatory drugs (NSAIDs) and supplements.  This website has more information on Xarelto: [email or web address]

## 2014-12-09 NOTE — Progress Notes (Signed)
Rn reviewed discharge instructions with patient and family. All questions answered.   Paperwork and prescriptions given.   NT rolled patient down in wheelchair with all belongings to family car.  

## 2014-12-09 NOTE — Progress Notes (Signed)
Occupational Therapy Treatment Patient Details Name: Kelsey Bruce MRN: [id number] DOB: 02-13-1951 Today's Date: 12/09/2014    History of present illness 64 yo female s/p L TKA   OT comments  Patient progressing nicely towards OT goals, continue plan of care for now. Pt requires min guard assist with sit<>stands and min verbal cues for safety, hand placement, and sequencing during transfers. Will continue to follow acutely from OT.   Follow Up Recommendations  No OT follow up;Supervision/Assistance - 24 hour    Equipment Recommendations  3 in 1 bedside comode    Recommendations for Other Services  None at this time   Precautions / Restrictions Precautions Precautions: Knee;Fall Restrictions Weight Bearing Restrictions: Yes LLE Weight Bearing: Weight bearing as tolerated    Mobility Bed Mobility Overal bed mobility: Needs Assistance Bed Mobility: Sit to Supine     Supine to sit: Supervision     General bed mobility comments: Supervision for safety, no bed rails used, and HOB flat. Pt able to manage LLE  Transfers Overall transfer level: Needs assistance Equipment used: Rolling walker (2 wheeled) Transfers: Sit to/from Stand Sit to Stand: Min guard         General transfer comment: Min guard for safety. Cues for hand placement and technique for management/positioning of LLE.     Balance Overall balance assessment: Needs assistance Sitting-balance support: No upper extremity supported;Feet supported Sitting balance-Leahy Scale: Fair     Standing balance support: Bilateral upper extremity supported;During functional activity Standing balance-Leahy Scale: Fair   ADL Overall ADL's : Needs assistance/impaired General ADL Comments: Pt found supine in bed starting to brush her teeth. Therapist encouraged pt to stand at sink, like she will at home, for grooming tasks. Pt willing. Pt engaged in bed mobility with HOB flat and no use of bed rails. Pt stood with RW and min  verbal cues for hand placement. From here, pt ambulated > sink with extra time. Pt stood for grooming tasks of washing face and brushing teeth. From here, pt sat in recliner and therapist set-up patient for sponge bath. Administerred tub/shower education handout for transfers using BSC.  Notified NT to assist pt with sit<>stands for bath, pt aware not to stand without staff assistance.      Cognition   Behavior During Therapy: WFL for tasks assessed/performed;Flat affect Overall Cognitive Status: Within Functional Limits for tasks assessed                 Pertinent Vitals/ Pain       Pain Assessment: Faces Faces Pain Scale: Hurts little more Pain Location: left knee Pain Descriptors / Indicators: Aching;Sore Pain Intervention(s): Limited activity within patient's tolerance;Monitored during session;Repositioned   Frequency Min 2X/week     Progress Toward Goals  OT Goals(current goals can now befound in the care plan section)  Progress towards OT goals: Progressing toward goals     Plan Discharge plan remains appropriate          Activity Tolerance Patient tolerated treatment well   Patient Left in chair;with call bell/phone within reach   Nurse Communication Other (comment) (Pt sitting in recliner and need for assistance with standing ADLs)     Time: 5277-8242 OT Time Calculation (min): 17 min  Charges: OT General Charges $OT Visit: 1 Procedure OT Treatments $Self Care/Home Management : 8-22 mins  CLAY,PATRICIA , MS, OTR/L, CLT Pager: [phone]  12/09/2014, 10:14 AM

## 2014-12-09 NOTE — Progress Notes (Signed)
Physical Therapy Treatment Patient Details Name: Kelsey Bruce MRN: [id number] DOB: 1950-04-29 Today's Date: 2014-12-11    History of Present Illness 64 yo female s/p L TKA    PT Comments    Slow but steady progress with mobility.  Pt eager for dc home.  Will review stairs with arrival of dtr  Follow Up Recommendations        Equipment Recommendations       Recommendations for Other Services       Precautions / Restrictions Precautions Precautions: Knee;Fall Restrictions Weight Bearing Restrictions: Yes LLE Weight Bearing: Weight bearing as tolerated    Mobility  Bed Mobility Overal bed mobility: Needs Assistance Bed Mobility: Sit to Supine     Supine to sit: Supervision     General bed mobility comments: Supervision for safety, no bed rails used, and HOB flat. Pt able to manage LLE  Transfers Overall transfer level: Needs assistance Equipment used: Rolling walker (2 wheeled) Transfers: Sit to/from Stand Sit to Stand: Min guard         General transfer comment: Min guard for safety. Cues for hand placement and technique for management/positioning of LLE.   Ambulation/Gait                 Stairs            Wheelchair Mobility    Modified Rankin (Stroke Patients Only)       Balance Overall balance assessment: Needs assistance Sitting-balance support: No upper extremity supported;Feet supported Sitting balance-Leahy Scale: Fair     Standing balance support: Bilateral upper extremity supported;During functional activity Standing balance-Leahy Scale: Fair                      Cognition Arousal/Alertness: Awake/alert Behavior During Therapy: WFL for tasks assessed/performed;Flat affect Overall Cognitive Status: Within Functional Limits for tasks assessed                      Exercises      General Comments        Pertinent Vitals/Pain Pain Assessment: Faces Faces Pain Scale: Hurts little more Pain  Location: left knee Pain Descriptors / Indicators: Aching;Sore Pain Intervention(s): Limited activity within patient's tolerance;Monitored during session;Repositioned    Home Living                      Prior Function            PT Goals (current goals can now be found in the care plan section)      Frequency       PT Plan      Co-evaluation             End of Session           Time:  -     Charges:                       G Codes:      Bruce,Kelsey 2014-12-11, 12:42 PM

## 2014-12-12 ENCOUNTER — Telehealth: Payer: Self-pay | Admitting: Oncology

## 2014-12-12 NOTE — Discharge Summary (Signed)
Physician Discharge Summary   Patient ID: Kelsey Bruce MRN: [id number] DOB/AGE: Jan 09, 1951 64 y.o.  Admit date: 12/07/2014 Discharge date: 12/09/2014  Primary Diagnosis: Primary osteoarthritis, left knee  Admission Diagnoses:  Past Medical History  Diagnosis Date  . Osteoarthritis     LB, HIPs, knees, hands  . Seasonal allergic rhinitis   . Cerebral palsy   . Endometrial adenocarcinoma 09/2013    Dr. Denman George: pt to get vaginal brachytherapy via rad onc  . Radiation 12/16/13, 12/22/13, 12/30/13, 01/06/14, 01/13/14    proximal vagina 30 gray  . Colon polyp, hyperplastic 2004  . Morton's neuroma 2012    Dr. Roseanne Reno did injections in the past (left foot)  . Difficulty sleeping    Discharge Diagnoses:   Active Problems:   History of total knee arthroplasty  Estimated body mass index is 25.56 kg/(m^2) as calculated from the following:   Height as of this encounter: _0  (1.626 m).   Weight as of this encounter: 67.586 kg (149 lb).  Procedure:  Procedure(s) (LRB): TOTAL KNEE ARTHROPLASTY (Left)   Consults: None  HPI: Kelsey Bruce, 64 y.o. female, has a history of pain and functional disability in the left knee due to trauma and arthritis and has failed non-surgical conservative treatments for greater than 12 weeks to includeNSAID's and/or analgesics, corticosteriod injections, viscosupplementation injections and activity modification. Onset of symptoms was gradual, starting 2 years ago with gradually worsening course since that time. The patient noted prior procedures on the knee to include arthroscopy and menisectomy on the left knee(s). Patient currently rates pain in the left knee(s) at 7 out of 10 with activity. Patient has night pain, worsening of pain with activity and weight bearing, pain that interferes with activities of daily living, pain with passive range of motion, crepitus and joint swelling. Patient has evidence of periarticular osteophytes and joint space narrowing  by imaging studies. There is no active infection.  Laboratory Data: Admission on 12/07/2014, Discharged on 12/09/2014  Component Date Value Ref Range Status  . WBC 12/08/2014 10.9* 4.0 - 10.5 K/uL Final  . RBC 12/08/2014 4.25  3.87 - 5.11 MIL/uL Final  . Hemoglobin 12/08/2014 12.6  12.0 - 15.0 g/dL Final  . HCT 12/08/2014 37.3  36.0 - 46.0 % Final  . MCV 12/08/2014 87.8  78.0 - 100.0 fL Final  . MCH 12/08/2014 29.6  26.0 - 34.0 pg Final  . MCHC 12/08/2014 33.8  30.0 - 36.0 g/dL Final  . RDW 12/08/2014 12.4  11.5 - 15.5 % Final  . Platelets 12/08/2014 235  150 - 400 K/uL Final  . Sodium 12/08/2014 141  135 - 145 mmol/L Final  . Potassium 12/08/2014 4.1  3.5 - 5.1 mmol/L Final  . Chloride 12/08/2014 107  101 - 111 mmol/L Final  . CO2 12/08/2014 27  22 - 32 mmol/L Final  . Glucose, Bld 12/08/2014 152* 65 - 99 mg/dL Final  . BUN 12/08/2014 9  6 - 20 mg/dL Final  . Creatinine, Ser 12/08/2014 0.60  0.44 - 1.00 mg/dL Final  . Calcium 12/08/2014 9.0  8.9 - 10.3 mg/dL Final  . GFR calc non Af Amer 12/08/2014 >60  >60 mL/min Final  . GFR calc Af Amer 12/08/2014 >60  >60 mL/min Final   Comment: (NOTE) The eGFR has been calculated using the CKD EPI equation. This calculation has not been validated in all clinical situations. eGFR's persistently <60 mL/min signify possible Chronic Kidney Disease.   . Anion gap 12/08/2014 7  5 -  15 Final  . WBC 12/09/2014 8.5  4.0 - 10.5 K/uL Final  . RBC 12/09/2014 3.99  3.87 - 5.11 MIL/uL Final  . Hemoglobin 12/09/2014 11.7* 12.0 - 15.0 g/dL Final  . HCT 12/09/2014 35.5* 36.0 - 46.0 % Final  . MCV 12/09/2014 89.0  78.0 - 100.0 fL Final  . MCH 12/09/2014 29.3  26.0 - 34.0 pg Final  . MCHC 12/09/2014 33.0  30.0 - 36.0 g/dL Final  . RDW 12/09/2014 12.7  11.5 - 15.5 % Final  . Platelets 12/09/2014 197  150 - 400 K/uL Final  . Sodium 12/09/2014 140  135 - 145 mmol/L Final  . Potassium 12/09/2014 3.8  3.5 - 5.1 mmol/L Final  . Chloride 12/09/2014 107  101  - 111 mmol/L Final  . CO2 12/09/2014 26  22 - 32 mmol/L Final  . Glucose, Bld 12/09/2014 129* 65 - 99 mg/dL Final  . BUN 12/09/2014 14  6 - 20 mg/dL Final  . Creatinine, Ser 12/09/2014 0.63  0.44 - 1.00 mg/dL Final  . Calcium 12/09/2014 8.6* 8.9 - 10.3 mg/dL Final  . GFR calc non Af Amer 12/09/2014 >60  >60 mL/min Final  . GFR calc Af Amer 12/09/2014 >60  >60 mL/min Final   Comment: (NOTE) The eGFR has been calculated using the CKD EPI equation. This calculation has not been validated in all clinical situations. eGFR's persistently <60 mL/min signify possible Chronic Kidney Disease.   Georgiann Hahn gap 12/09/2014 7  5 - 15 Final  Hospital Outpatient Visit on 11/30/2014  Component Date Value Ref Range Status  . aPTT 11/30/2014 33  24 - 37 seconds Final  . MRSA, PCR 11/30/2014 NEGATIVE  NEGATIVE Final  . Staphylococcus aureus 11/30/2014 NEGATIVE  NEGATIVE Final   Comment:        The Xpert SA Assay (FDA approved for NASAL specimens in patients over 68 years of age), is one component of a comprehensive surveillance program.  Test performance has been validated by [facility] for patients greater than or equal to 84 year old. It is not intended to diagnose infection nor to guide or monitor treatment.   . WBC 11/30/2014 6.3  4.0 - 10.5 K/uL Final  . RBC 11/30/2014 4.81  3.87 - 5.11 MIL/uL Final  . Hemoglobin 11/30/2014 14.4  12.0 - 15.0 g/dL Final  . HCT 11/30/2014 43.9  36.0 - 46.0 % Final  . MCV 11/30/2014 91.3  78.0 - 100.0 fL Final  . MCH 11/30/2014 29.9  26.0 - 34.0 pg Final  . MCHC 11/30/2014 32.8  30.0 - 36.0 g/dL Final  . RDW 11/30/2014 12.4  11.5 - 15.5 % Final  . Platelets 11/30/2014 287  150 - 400 K/uL Final  . Neutrophils Relative % 11/30/2014 49  43 - 77 % Final  . Neutro Abs 11/30/2014 3.1  1.7 - 7.7 K/uL Final  . Lymphocytes Relative 11/30/2014 39  12 - 46 % Final  . Lymphs Abs 11/30/2014 2.5  0.7 - 4.0 K/uL Final  . Monocytes Relative 11/30/2014 10  3 - 12 % Final    . Monocytes Absolute 11/30/2014 0.6  0.1 - 1.0 K/uL Final  . Eosinophils Relative 11/30/2014 1  0 - 5 % Final  . Eosinophils Absolute 11/30/2014 0.1  0.0 - 0.7 K/uL Final  . Basophils Relative 11/30/2014 1  0 - 1 % Final  . Basophils Absolute 11/30/2014 0.0  0.0 - 0.1 K/uL Final  . Sodium 11/30/2014 142  135 - 145 mmol/L Final  .  Potassium 11/30/2014 5.3* 3.5 - 5.1 mmol/L Final  . Chloride 11/30/2014 105  101 - 111 mmol/L Final  . CO2 11/30/2014 28  22 - 32 mmol/L Final  . Glucose, Bld 11/30/2014 88  65 - 99 mg/dL Final  . BUN 11/30/2014 19  6 - 20 mg/dL Final  . Creatinine, Ser 11/30/2014 0.78  0.44 - 1.00 mg/dL Final  . Calcium 11/30/2014 9.9  8.9 - 10.3 mg/dL Final  . Total Protein 11/30/2014 7.6  6.5 - 8.1 g/dL Final  . Albumin 11/30/2014 4.3  3.5 - 5.0 g/dL Final  . AST 11/30/2014 26  15 - 41 U/L Final  . ALT 11/30/2014 21  14 - 54 U/L Final  . Alkaline Phosphatase 11/30/2014 61  38 - 126 U/L Final  . Total Bilirubin 11/30/2014 0.5  0.3 - 1.2 mg/dL Final  . GFR calc non Af Amer 11/30/2014 >60  >60 mL/min Final  . GFR calc Af Amer 11/30/2014 >60  >60 mL/min Final   Comment: (NOTE) The eGFR has been calculated using the CKD EPI equation. This calculation has not been validated in all clinical situations. eGFR's persistently <60 mL/min signify possible Chronic Kidney Disease.   . Anion gap 11/30/2014 9  5 - 15 Final  . Prothrombin Time 11/30/2014 13.6  11.6 - 15.2 seconds Final  . INR 11/30/2014 1.02  0.00 - 1.49 Final  . ABO/RH(D) 11/30/2014 O POS   Final  . Antibody Screen 11/30/2014 NEG   Final  . Sample Expiration 11/30/2014 12/10/2014   Final  . Color, Urine 11/30/2014 YELLOW  YELLOW Final  . APPearance 11/30/2014 CLEAR  CLEAR Final  . Specific Gravity, Urine 11/30/2014 1.009  1.005 - 1.030 Final  . pH 11/30/2014 6.0  5.0 - 8.0 Final  . Glucose, UA 11/30/2014 NEGATIVE  NEGATIVE mg/dL Final  . Hgb urine dipstick 11/30/2014 NEGATIVE  NEGATIVE Final  . Bilirubin Urine  11/30/2014 NEGATIVE  NEGATIVE Final  . Ketones, ur 11/30/2014 NEGATIVE  NEGATIVE mg/dL Final  . Protein, ur 11/30/2014 NEGATIVE  NEGATIVE mg/dL Final  . Urobilinogen, UA 11/30/2014 0.2  0.0 - 1.0 mg/dL Final  . Nitrite 11/30/2014 NEGATIVE  NEGATIVE Final  . Leukocytes, UA 11/30/2014 NEGATIVE  NEGATIVE Final   MICROSCOPIC NOT DONE ON URINES WITH NEGATIVE PROTEIN, BLOOD, LEUKOCYTES, NITRITE, OR GLUCOSE <1000 mg/dL.  Hospital Outpatient Visit on 11/10/2014  Component Date Value Ref Range Status  . CYTOLOGY - PAP 11/10/2014 PAP RESULT   Final     X-Rays:Dg Chest 2 View  11/30/2014   CLINICAL DATA:  Preop for knee replacement, history of endometrial carcinoma  EXAM: CHEST  2 VIEW  COMPARISON:  CT chest of 09/15/2013  FINDINGS: No active infiltrate or effusion is seen. No lung nodule is noted. Mediastinal and hilar contours are unremarkable. The heart is within normal limits in size. No bony abnormality is noted.  IMPRESSION: No active cardiopulmonary disease.   Electronically Signed   By: Ivar Drape M.D.   On: 11/30/2014 14:31    EKG: Orders placed or performed during the hospital encounter of 11/30/14  . EKG  . EKG     Hospital Course: Kelsey Bruce is a 64 y.o. who was admitted to [facility]. They were brought to the operating room on 12/07/2014 and underwent Procedure(s): TOTAL KNEE ARTHROPLASTY.  Patient tolerated the procedure well and was later transferred to the recovery room and then to the orthopaedic floor for postoperative care.  They were given PO and IV analgesics  for pain control following their surgery.  They were given 24 hours of postoperative antibiotics of  Anti-infectives    Start     Dose/Rate Route Frequency Ordered Stop   12/07/14 1500  ceFAZolin (ANCEF) IVPB 1 g/50 mL premix     1 g 100 mL/hr over 30 Minutes Intravenous Every 6 hours 12/07/14 1047 12/07/14 2056   12/07/14 0930  polymyxin B 500,000 Units, bacitracin 50,000 Units in sodium chloride irrigation  0.9 % 500 mL irrigation  Status:  Discontinued       As needed 12/07/14 1028 12/07/14 1050   12/07/14 0754  ceFAZolin (ANCEF) IVPB 2 g/50 mL premix     2 g 100 mL/hr over 30 Minutes Intravenous On call to O.R. 12/07/14 0092 12/07/14 0846     and started on DVT prophylaxis in the form of Xarelto.   PT and OT were ordered for total joint protocol.  Discharge planning consulted to help with postop disposition and equipment needs.  Patient had a good night on the evening of surgery.  They started to get up OOB with therapy on day one. Hemovac drain was pulled without difficulty.  Continued to work with therapy into day two.  Dressing was changed on day two and the incision was clean and dry.  Incision was healing well.  Patient was seen in rounds and was ready to go home.   Diet: Regular diet Activity:WBAT Follow-up:in 2 weeks Disposition - Home Discharged Condition: stable   Discharge Instructions    Call MD / Call 911    Complete by:  As directed   If you experience chest pain or shortness of breath, CALL 911 and be transported to the hospital emergency room.  If you develope a fever above 101 F, pus (white drainage) or increased drainage or redness at the wound, or calf pain, call your surgeon's office.     Constipation Prevention    Complete by:  As directed   Drink plenty of fluids.  Prune juice may be helpful.  You may use a stool softener, such as Colace (over the counter) 100 mg twice a day.  Use MiraLax (over the counter) for constipation as needed.     Diet general    Complete by:  As directed      Discharge instructions    Complete by:  As directed   [place] items at home which could result in a fall. This includes throw rugs or furniture in walking pathways ICE to the affected joint every three hours while awake for 30 minutes at a time, for at least the first 3-5 days, and then as needed for pain and swelling.  Continue to use ice for pain  and swelling. You may notice swelling that will progress down to the foot and ankle.  This is normal after surgery.  Elevate your leg when you are not up walking on it.   Continue to use the breathing machine you got in the hospital (incentive spirometer) which will help keep your temperature down.  It is common for your temperature to cycle up and down following surgery, especially at night when you are not up moving around and exerting yourself.  The breathing machine keeps your lungs expanded and your temperature down.   DIET:  As you were doing prior to hospitalization, we recommend a well-balanced diet.  DRESSING / WOUND CARE / SHOWERING  Keep the surgical dressing until follow up.  The dressing is water  proof, so you can shower without any extra covering.  IF THE DRESSING FALLS OFF or the wound gets wet inside, change the dressing with sterile gauze.  Please use good hand washing techniques before changing the dressing.  Do not use any lotions or creams on the incision until instructed by your surgeon.    ACTIVITY  Increase activity slowly as tolerated, but follow the weight bearing instructions below.   No driving for 6 weeks or until further direction given by your physician.  You cannot drive while taking narcotics.  No lifting or carrying greater than 10 lbs. until further directed by your surgeon. Avoid periods of inactivity such as sitting longer than an hour when not asleep. This helps prevent blood clots.  You may return to work once you are authorized by your doctor.     WEIGHT BEARING   Weight bearing as tolerated with assist device (walker, cane, etc) as directed, use it as long as suggested by your surgeon or therapist, typically at least 4-6 weeks.   EXERCISES  Results after joint replacement surgery are often greatly improved when you follow the exercise, range of motion and muscle strengthening exercises prescribed by your doctor. Safety measures are also important to  protect the joint from further injury. Any time any of these exercises cause you to have increased pain or swelling, decrease what you are doing until you are comfortable again and then slowly increase them. If you have problems or questions, call your caregiver or physical therapist for advice.   Rehabilitation is important following a joint replacement. After just a few days of immobilization, the muscles of the leg can become weakened and shrink (atrophy).  These exercises are designed to build up the tone and strength of the thigh and leg muscles and to improve motion. Often times heat used for twenty to thirty minutes before working out will loosen up your tissues and help with improving the range of motion but do not use heat for the first two weeks following surgery (sometimes heat can increase post-operative swelling).   These exercises can be done on a training (exercise) mat, on the floor, on a table or on a bed. Use whatever works the best and is most comfortable for you.    Use music or television while you are exercising so that the exercises are a pleasant break in your day. This will make your life better with the exercises acting as a break in your routine that you can look forward to.   Perform all exercises about fifteen times, three times per day or as directed.  You should exercise both the operative leg and the other leg as well.  Exercises include:   Quad Sets - Tighten up the muscle on the front of the thigh (Quad) and hold for 5-10 seconds.   Straight Leg Raises - With your knee straight (if you were given a brace, keep it on), lift the leg to 60 degrees, hold for 3 seconds, and slowly lower the leg.  Perform this exercise against resistance later as your leg gets stronger.  Leg Slides: Lying on your back, slowly slide your foot toward your buttocks, bending your knee up off the floor (only go as far as is comfortable). Then slowly slide your foot back down until your leg is flat on  the floor again.  Angel Wings: Lying on your back spread your legs to the side as far apart as you can without causing discomfort.  Hamstring Strength:  Lying on your back, push your heel against the floor with your leg straight by tightening up the muscles of your buttocks.  Repeat, but this time bend your knee to a comfortable angle, and push your heel against the floor.  You may put a pillow under the heel to make it more comfortable if necessary.   A rehabilitation program following joint replacement surgery can speed recovery and prevent re-injury in the future due to weakened muscles. Contact your doctor or a physical therapist for more information on knee rehabilitation.    CONSTIPATION  Constipation is defined medically as fewer than three stools per week and severe constipation as less than one stool per week.  Even if you have a regular bowel pattern at home, your normal regimen is likely to be disrupted due to multiple reasons following surgery.  Combination of anesthesia, postoperative narcotics, change in appetite and fluid intake all can affect your bowels.   YOU MUST use at least one of the following options; they are listed in order of increasing strength to get the job done.  They are all available over the counter, and you may need to use some, POSSIBLY even all of these options:    Drink plenty of fluids (prune juice may be helpful) and high fiber foods Colace 100 mg by mouth twice a day  Senokot for constipation as directed and as needed Dulcolax (bisacodyl), take with full glass of water  Miralax (polyethylene glycol) once or twice a day as needed.  If you have tried all these things and are unable to have a bowel movement in the first 3-4 days after surgery call either your surgeon or your primary doctor.    If you experience loose stools or diarrhea, hold the medications until you stool forms back up.  If your symptoms do not get better within 1 week or if they get worse,  check with your doctor.  If you experience "the worst abdominal pain ever" or develop nausea or vomiting, please contact the office immediately for further recommendations for treatment.   ITCHING:  If you experience itching with your medications, try taking only a single pain pill, or even half a pain pill at a time.  You can also use Benadryl over the counter for itching or also to help with sleep.   TED HOSE STOCKINGS:  Use stockings on both legs until for at least 2 weeks or as directed by physician office. They may be removed at night for sleeping.  MEDICATIONS:  See your medication summary on the "After Visit Summary" that nursing will review with you.  You may have some home medications which will be placed on hold until you complete the course of blood thinner medication.  It is important for you to complete the blood thinner medication as prescribed.  PRECAUTIONS:  If you experience chest pain or shortness of breath - call 911 immediately for transfer to the hospital emergency department.   If you develop a fever greater that 101 F, purulent drainage from wound, increased redness or drainage from wound, foul odor from the wound/dressing, or calf pain - CONTACT YOUR SURGEON.                                                   FOLLOW-UP APPOINTMENTS:  If you do not already have a post-op appointment,  please call the office for an appointment to be seen by your surgeon.  Guidelines for how soon to be seen are listed in your "After Visit Summary", but are typically between 1-4 weeks after surgery.    MAKE SURE YOU:  Understand these instructions.  Get help right away if you are not doing well or get worse.    Thank you for letting us be a part of your medical care team.  It is a privilege we respect greatly.  We hope these instructions will help you stay on track for a fast and full recovery!     Increase activity slowly as tolerated    Complete by:  As directed               Medication List    STOP taking these medications        meloxicam 15 MG tablet  Commonly known as:  MOBIC     multivitamin tablet     zoster vaccine live (PF) 19400 UNT/0.65ML injection  Commonly known as:  ZOSTAVAX      TAKE these medications        beta carotene w/minerals tablet  Take 1 tablet by mouth every morning.     cetirizine 10 MG tablet  Commonly known as:  ZYRTEC  Take 10 mg by mouth daily as needed for allergies.     docusate sodium 100 MG capsule  Commonly known as:  COLACE  Take 100 mg by mouth daily as needed for mild constipation.     fluticasone 50 MCG/ACT nasal spray  Commonly known as:  FLONASE  Place 2 sprays into both nostrils daily.     gabapentin 300 MG capsule  Commonly known as:  NEURONTIN  Take 1 tab po qhs     HYDROcodone-acetaminophen 5-325 MG per tablet  Commonly known as:  NORCO/VICODIN  Take 1-2 tablets by mouth every 4 (four) hours as needed for severe pain.     methocarbamol 500 MG tablet  Commonly known as:  ROBAXIN  Take 1 tablet (500 mg total) by mouth every 6 (six) hours as needed for muscle spasms.     ondansetron 4 MG tablet  Commonly known as:  ZOFRAN  Take 1 tablet (4 mg total) by mouth every 6 (six) hours as needed for nausea.     polyethylene glycol packet  Commonly known as:  MIRALAX / GLYCOLAX  Take 17 g by mouth daily as needed for mild constipation.     PROBIOTIC PO  Take 1 tablet by mouth every morning.     rivaroxaban 10 MG Tabs tablet  Commonly known as:  XARELTO  Take 1 tablet (10 mg total) by mouth daily with breakfast.     traMADol 50 MG tablet  Commonly known as:  ULTRAM  Take 1-2 tablets (50-100 mg total) by mouth every 6 (six) hours as needed for moderate pain.           Follow-up Information    Follow up with GIOFFRE,RONALD A, MD. Schedule an appointment as soon as possible for a visit in 2 weeks.   Specialty:  Orthopedic Surgery   Contact information:   [address] [place] [address] [phone]       Follow up with [facility].   Why:  physical therapy   Contact information:   [address] [place] [address] [phone]       Follow up with [facility].  Why:  bedside commode   Contact information:   [place] [address] [phone]       Signed: Ardeen Jourdain, PA-C Orthopaedic Surgery 12/12/2014, 9:01 AM

## 2014-12-14 ENCOUNTER — Telehealth: Payer: Self-pay | Admitting: *Deleted

## 2014-12-14 NOTE — Telephone Encounter (Signed)
Richardson Landry with [facility] PT [facility] on 12/14/14 at 3:36pm  He stated that he has seen pt twice so far for PT due to total knee replacement. He stated that at each visit pts HR has been elevated along with her BP. He stated that pts HR at the first visit was 113 and BP was 148/82. He stated that at the second visit which was today pts HR was 96 and BP 148/80. He stated that he is concerned about this and would like a call back. Please advise. Thanks.

## 2014-12-14 NOTE — Telephone Encounter (Signed)
Pls let the PT know that these HR's and BP's are ok, may continue with therapy, continue to monitor HR and BP each session and if the HR gets >130 or bp >160 over 110 then have pt make appt with me for f/u.-thx

## 2014-12-15 NOTE — Telephone Encounter (Signed)
Left message for Steve to call back.

## 2014-12-15 NOTE — Telephone Encounter (Signed)
Richardson Landry advised and voiced understanding.

## 2014-12-28 ENCOUNTER — Telehealth: Payer: Self-pay | Admitting: Family Medicine

## 2014-12-28 NOTE — Telephone Encounter (Signed)
No rx cough med is recommended. I recommend she take otc mucinex dm or robitussin dm generic--as directed on the packaging.

## 2014-12-28 NOTE — Telephone Encounter (Signed)
Patient has had a knee replacement 3 weeks ago. She is on Tramadol & Hydrocodone. She is coughing & running a low grade fever. Can she take a cough medicine with her pain medication? If yes, can an Rx be sent in?

## 2014-12-28 NOTE — Telephone Encounter (Signed)
Please advise. Thanks.  

## 2014-12-28 NOTE — Telephone Encounter (Signed)
Pt advised and voiced understanding.   

## 2015-01-22 ENCOUNTER — Encounter: Payer: Self-pay | Admitting: Family Medicine

## 2015-02-15 ENCOUNTER — Encounter: Payer: Self-pay | Admitting: Gynecologic Oncology

## 2015-02-15 ENCOUNTER — Ambulatory Visit: Payer: 59 | Attending: Gynecologic Oncology | Admitting: Gynecologic Oncology

## 2015-02-15 VITALS — BP 159/98 | HR 87 | Temp 98.5°F | Resp 18 | Ht 64.0 in | Wt 144.9 lb

## 2015-02-15 DIAGNOSIS — C541 Malignant neoplasm of endometrium: Secondary | ICD-10-CM | POA: Diagnosis not present

## 2015-02-15 NOTE — Patient Instructions (Signed)
Plan to follow up with Dr. Sondra Come as scheduled and Dr. Denman George in May.  Please call for any new symptoms, questions, or concerns.

## 2015-02-15 NOTE — Progress Notes (Signed)
GYN ONC FOLLOWUP  Assessment:    64 y.o. 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   S/p robotic hysterectomy, BSO, lymphadenectomy on 10/05/13. + LVSI, 80% myometrial invasion, negative pelvic washings and negative lymph nodes. She has completed her vaginal cuff brachytherapy with her last treatment on January 13, 2014. She's doing very well post radiation with the normal expected side effects.     Plan: 1) Follow-up with Dr. Sondra Come in 3 months (January) and return to see me in 6 months (May).  HPI:  Kelsey Bruce is a 64 y.o. year old G2P2002 initially seen in consultation on 6/26 for endometrial cancer referred by Dr Hulan Fray.  She then underwent a robotic assisted total hysterectomy, BSO and pelvic lymphadenectomy on [id number] without complications.  Her postoperative course was uncomplicated.  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 She was recommended to receive adjuvant vaginal brachytherapy to reduce the risk for local recurrence based on her high/intermediate risk factors.   Pathology report 10/05/13:  1. Uterus +/- tubes/ovaries, neoplastic, with cervix - INVASIVE ENDOMETRIOID CARCINOMA, FIGO GRADE II, INVADING INTO OUTER HALF OF THE MYOMETRIUM WITH ANGIOLYMPHATIC INVASION PRESENT. - CERVIX: BENIGN SQUAMOUS MUCOSA AND ENDOCERVICAL MUCOSA, NO DYSPLASIA OR MALIGNANCY. - BILATERAL OVARIES AND FALLOPIAN TUBES: BENIGN OVARIAN TISSUE AND FALLOPIAN TUBAL TISSUE, NO EVIDENCE OF MALIGNANCY. - PLEASE SEE ONCOLOGY TEMPLATE FOR DETAILS. 2. Lymph nodes, regional resection, left pelvic - SIX LYMPH NODES, NEGATIVE FOR METASTATIC CARCINOMA (0/6). 3. Lymph nodes, regional resection, right pelvic - THIRTEEN LYMPH NODES, NEGATIVE FOR METASTATIC CARCINOMA (0/13). Microscopic Comment 1. ONCOLOGY TABLE-UTERUS, CARCINOMA Specimen:  Uterus, cervix, bilateral ovaries and fallopian tubes. Procedure: Total hysterectomy and bilateral salpingo-oophorectomy. Lymph node sampling performed: Yes Specimen integrity: Intact. Maximum tumor size: 4.0 cm, gross measurement. Histologic type: Invasive endometrioid carcinoma. Grade: FIGO Grade II Myometrial invasion: 1.2 cm where myometrium is 1.5 cm in thickness Cervical stromal involvement: No Extent of involvement of other organs: No Lymph - vascular invasion: Present. Peritoneal washings: Negative [id number] Lymph nodes: # examined 19 ; # positive 0 Pelvic lymph nodes: 0 involved of 19 lymph nodes. Para-aortic lymph nodes:   Interval Hx:  She underwent left TKA this year and is doing much better from this. She denies symptoms concerning for recurrent endometrial cancer.  Review of systems: Constitutional:  She has no weight gain or weight loss. She has no fever or chills. Eyes: No blurred vision sores. Cardiovascular: No chest pain or edema. Respiratory:  No shortness of breath Gastrointestinal: She has normal bowel movements without diarrhea or constipation. She denies any nausea or vomiting. She denies blood in her stool or heart burn. Genitourinary:  She denies pelvic pain, pelvic pressure or changes in her urinary function. She has no hematuria, dysuria, or incontinence. She has no irregular vaginal bleeding or vaginal discharge Musculoskeletal: + left knee pain Skin:  + vulvar itching Psychiatric:  She denies depression or anxiety. Hematologic/Lymphatic:   No easy bruising or bleeding  No Known Allergies Outpatient Encounter Prescriptions as of 02/15/2015  Medication Sig  . beta carotene w/minerals (OCUVITE) tablet Take 1 tablet by mouth every morning.   . cetirizine (ZYRTEC) 10 MG tablet Take 10 mg by mouth daily as needed for allergies.   Marland Kitchen docusate sodium (COLACE) 100 MG capsule Take 100 mg by mouth daily as needed for mild constipation.   . fluticasone  (FLONASE)  50 MCG/ACT nasal spray Place 2 sprays into both nostrils daily. (Patient taking differently: Place 2 sprays into both nostrils daily as needed for allergies or rhinitis. )  . gabapentin (NEURONTIN) 300 MG capsule Take 1 tab po qhs (Patient taking differently: Take 300 mg by mouth at bedtime. )  . HYDROcodone-acetaminophen (NORCO/VICODIN) 5-325 MG per tablet Take 1-2 tablets by mouth every 4 (four) hours as needed for severe pain.  . methocarbamol (ROBAXIN) 500 MG tablet Take 1 tablet (500 mg total) by mouth every 6 (six) hours as needed for muscle spasms.  . ondansetron (ZOFRAN) 4 MG tablet Take 1 tablet (4 mg total) by mouth every 6 (six) hours as needed for nausea.  . polyethylene glycol (MIRALAX / GLYCOLAX) packet Take 17 g by mouth daily as needed for mild constipation.   . Probiotic Product (PROBIOTIC PO) Take 1 tablet by mouth every morning.  . rivaroxaban (XARELTO) 10 MG TABS tablet Take 1 tablet (10 mg total) by mouth daily with breakfast.  . traMADol (ULTRAM) 50 MG tablet Take 1-2 tablets (50-100 mg total) by mouth every 6 (six) hours as needed for moderate pain.   No facility-administered encounter medications on file as of 02/15/2015.   Past Medical History  Diagnosis Date  . Osteoarthritis     LB, HIPs, knees (L knee end stage), hands  . Seasonal allergic rhinitis   . Cerebral palsy ([place])   . Endometrial adenocarcinoma ([place]) 09/2013    Dr. Denman George: pt to get vaginal brachytherapy via rad onc  . Radiation 12/16/13, 12/22/13, 12/30/13, 01/06/14, 01/13/14    proximal vagina 30 gray  . Colon polyp, hyperplastic 2004  . Morton's neuroma 2012    Dr. Roseanne Reno did injections in the past (left foot)  . Difficulty sleeping    Oncology History   64 year old woman with stage IB grade 2 endometriod endometrial adenocarcinoma with high/intermediate risk factors. Recommended to receive adjuvant vaginal brachytherapy after definitive staging to reduce the risk for recurrence.      Endometrial cancer, grade I ([place])   09/06/2013 Initial Diagnosis Endometrial cancer, grade I   10/05/2013 Surgery robotic assisted total hysterctomy, BSO, pelvic lymphadenectomy   12/16/2013 - 01/13/2014 Radiation Therapy Vaginal cuff brachytherapy   Past Surgical History  Procedure Laterality Date  . Dilation and curettage of uterus    . Tonsillectomy    . Colonoscopy  09/2002 (Dr. Henrene Pastor)    hyperplastic; recall sent 2009 but pt apparently didn't respond.  . Tubal ligation    . Robotic assisted total hysterectomy with bilateral salpingo oopherectomy Bilateral 10/05/2013    Procedure: ROBOTIC ASSISTED TOTAL HYSTERECTOMY WITH BILATERAL SALPINGO OOPHORECTOMY WITH LYMPH NODE DISECTION;  Surgeon: Everitt Amber, MD;  Location: [facility];  Service: Gynecology;  Laterality: Bilateral;  . Leg surgery Left 01/2014    "cleaned out cartilage"  . Total knee arthroplasty Left 12/07/2014    Procedure: TOTAL KNEE ARTHROPLASTY;  Surgeon: Latanya Maudlin, MD;  Location: [facility];  Service: Orthopedics;  Laterality: Left;   Family History  Problem Relation Age of Onset  . Heart disease Father   . Hypertension Father   . Cancer Father     lung  . Arthritis Father   . Alcohol abuse Father   . Heart disease Mother   . Cancer Mother     lung  . Deep vein thrombosis Mother   . Alcohol abuse Mother   . Heart disease Brother   . Hypertension Brother   . Heart disease Brother   .  Hypertension Brother   . Heart disease Brother   . Hypertension Brother    Social History   Social History  . Marital Status: Divorced    Spouse Name: N/A  . Number of Children: 2  . Years of Education: N/A   Occupational History  . retired    Social History Main Topics  . Smoking status: Never Smoker   . Smokeless tobacco: Never Used  . Alcohol Use: Yes     Comment: occasional  . Drug Use: No  . Sexual Activity: No   Other Topics Concern  . None   Social History Narrative   Divorced.  Two children.   Orig from  [place], [place].   HS education.   Works at [employer] in [place].   No tobacco, occ alcohol, no drugs.   Has 3 brothers and they all smoked, as did her parents.    Physical Exam: Blood pressure 159/98, pulse 87, temperature 98.5 F (36.9 C), temperature source Oral, resp. rate 18, height 5\' 4"  (1.626 m), weight 144 lb 14.4 oz (65.726 kg), SpO2 100 %. General: Well dressed, well nourished in no apparent distress.    Lungs:  Clear to auscultation bilaterally.  No wheezes.  Cardiovascular:  Regular rate and rhythm.   Abdomen:  Soft, nontender, nondistended.  No palpable masses.  No hepatosplenomegaly.  No ascites. Normal bowel sounds.  No hernias.  Incisions are well healed.  Genitourinary: Normal EGBUS  Vaginal cuff intact. No visible lesions, atrophic.  No bleeding or discharge.  No masses or nodularity, rectal confirms. Petechial changes at vaginal cuff.  Extremities: No cyanosis, clubbing or edema.   Psychiatric: Mood and affect are appropriate.  Donaciano Eva, MD

## 2015-04-12 IMAGING — CT CT ABD-PELV W/ CM
2 of 4 series · 16 of 46 positions shown, 18 images · IV contrast (omnipaque)
Comparison: None.

CLINICAL DATA: Abdominal pain. New diagnosis of endometrial
carcinoma.

EXAM:
CT CHEST, ABDOMEN, AND PELVIS WITH CONTRAST
TECHNIQUE: Multidetector CT imaging of the chest, abdomen and pelvis was
performed following the standard protocol during bolus
administration of intravenous contrast.
CONTRAST:  100mL OMNIPAQUE IOHEXOL 300 MG/ML  SOLN

[Series 2: cap with st · axial · 0.73mm/px · z∈[+730,+1280]mm · 13 of 120 slices shown, 15 images]
[im 5/120  soft-tissue]
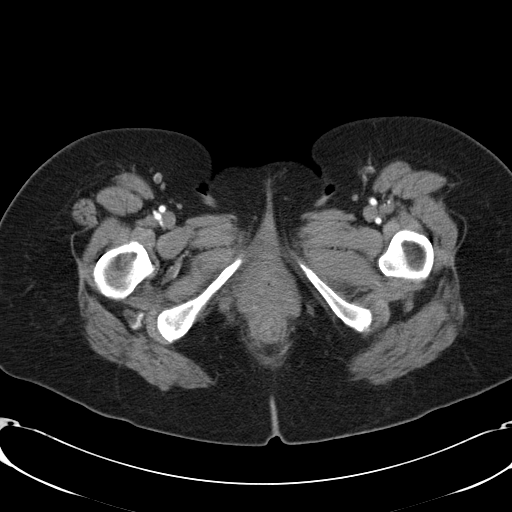
[im 5/120  bone]
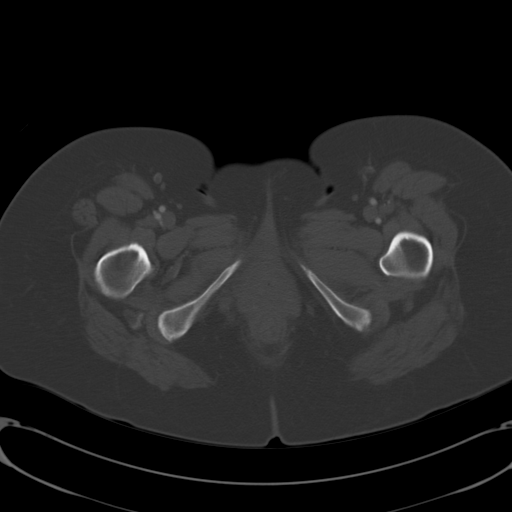
[im 15/120  soft-tissue]
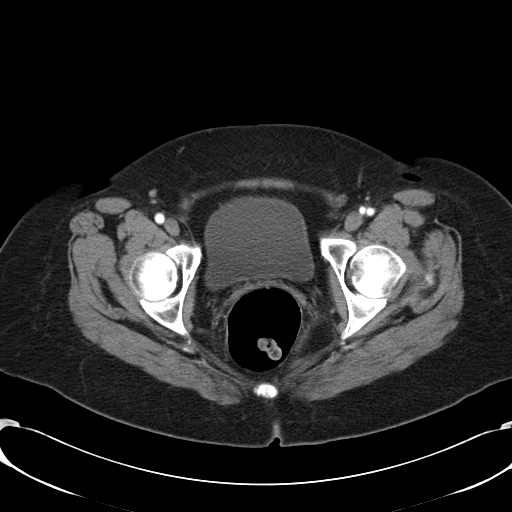
[im 24/120  soft-tissue]
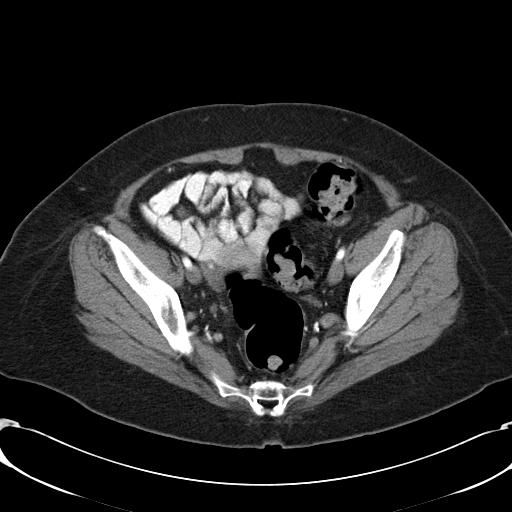
[im 34/120  soft-tissue]
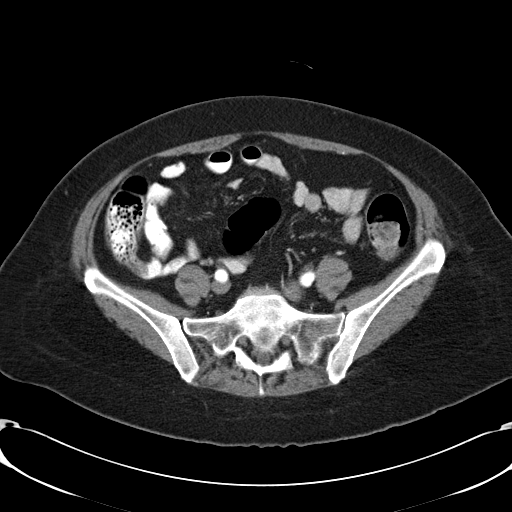
[im 43/120  soft-tissue]
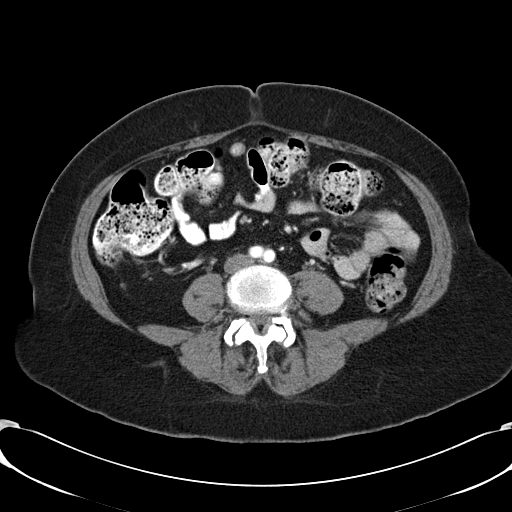
[im 53/120  soft-tissue]
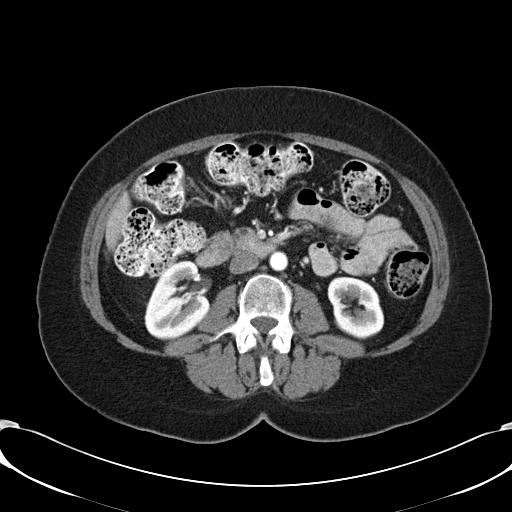
[im 62/120  soft-tissue]
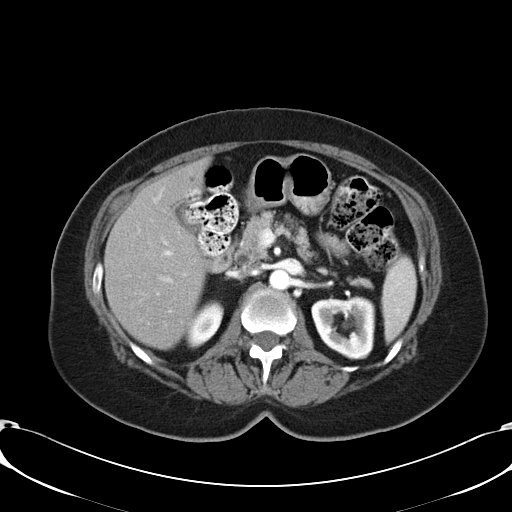
[im 67/120  soft-tissue]
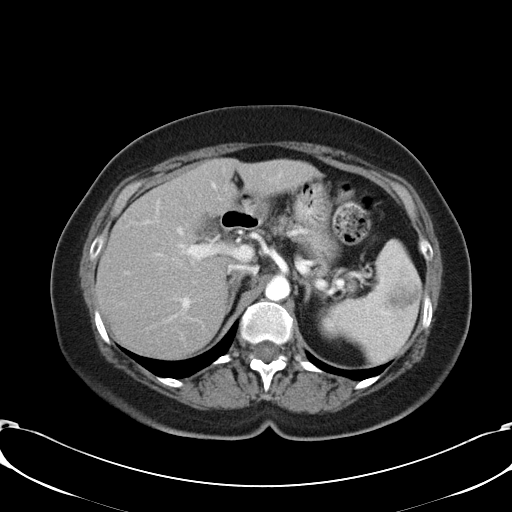
[im 77/120  soft-tissue]
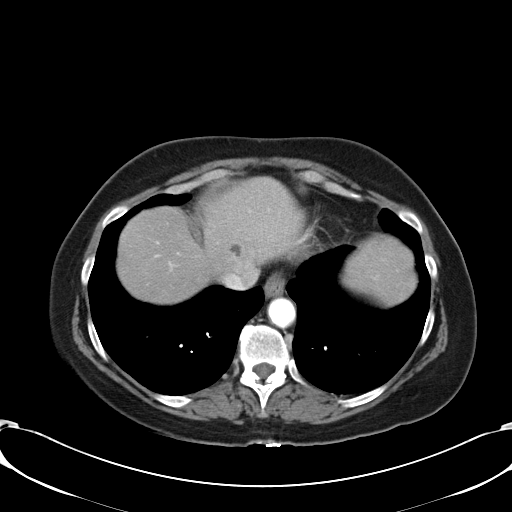
[im 77/120  bone]
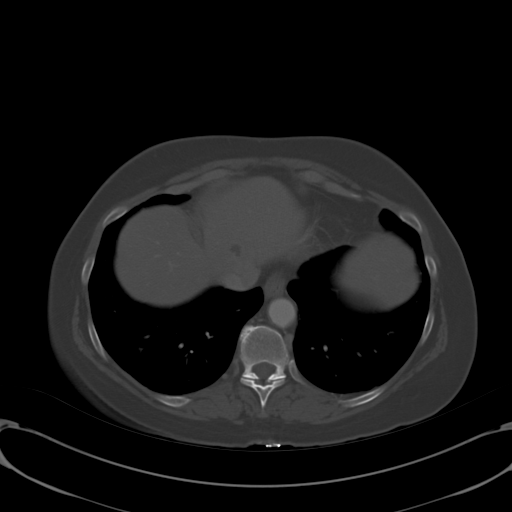
[im 86/120  soft-tissue]
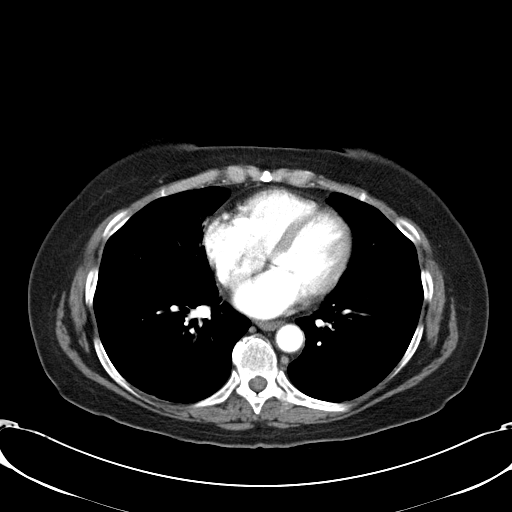
[im 96/120  soft-tissue]
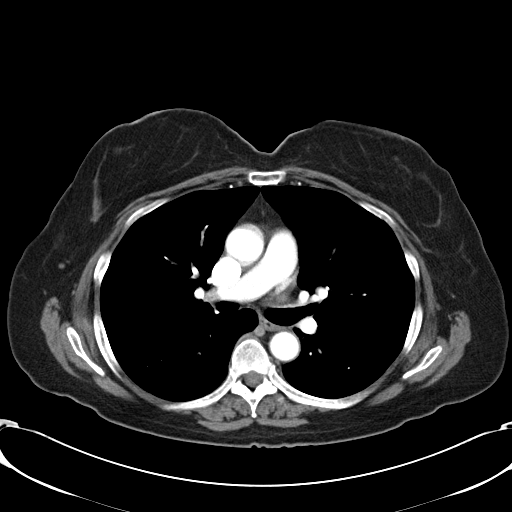
[im 105/120  soft-tissue]
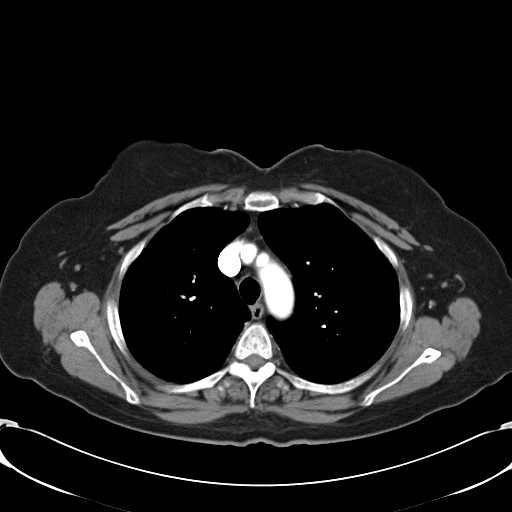
[im 115/120  soft-tissue]
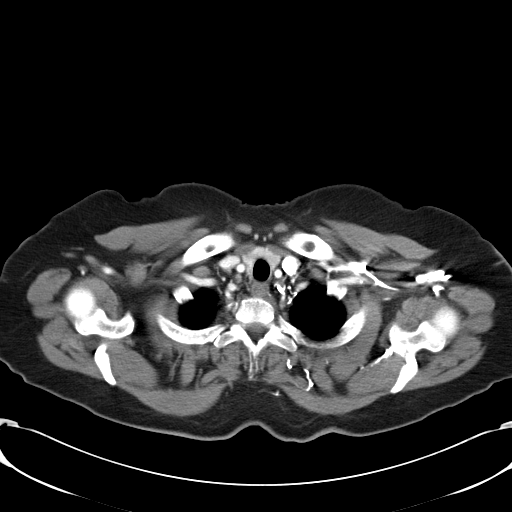

[Series 602: <mpr thick range> · coronal · 1.17mm/px · 3 of 84 slices shown]
[im 28/84  soft-tissue]
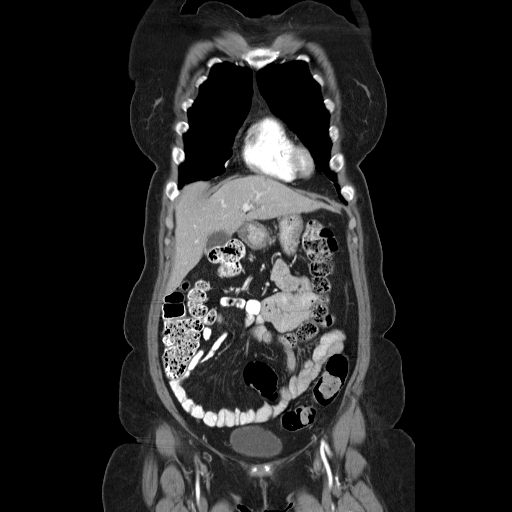
[im 37/84  soft-tissue]
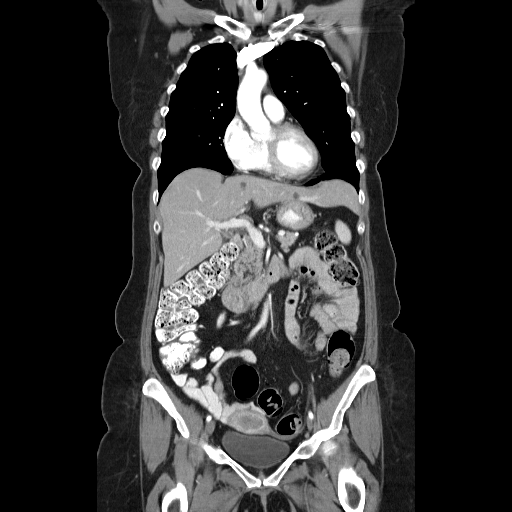
[im 47/84  soft-tissue]
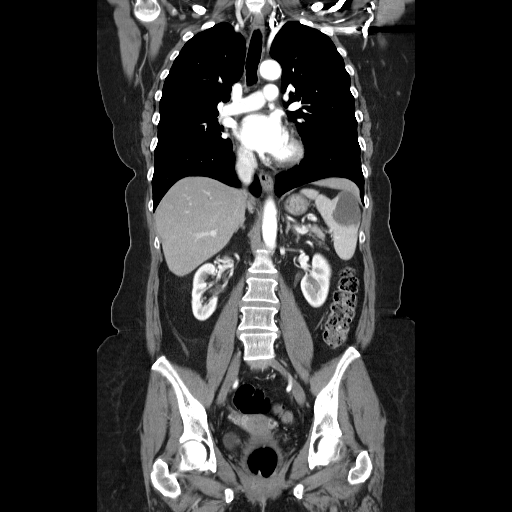

[16 of 46 positions shown; findings below may reference images not displayed]

FINDINGS: CT CHEST FINDINGS

No pleural effusion identified. There is no airspace consolidation
identified. Tiny nodule noted within the right upper lobe measuring
3 mm, image 30/series 5. No additional nodules are mass is noted.
Heart size is normal. No pericardial effusion. No enlarged
mediastinal or hilar lymph nodes. No axillary or supraclavicular
lymph nodes.

Review of the visualized osseous structures is significant for mild
thoracic spondylosis. No aggressive lytic or sclerotic bone lesions.

CT ABDOMEN AND PELVIS FINDINGS

Intermediate attenuating structure within the spleen measures
cm. Several cysts are noted within the liver. The gallbladder
appears normal. No biliary dilatation. Common bile duct measures 6
mm. Normal appearance of the pancreas.

The adrenal glands both appear normal. The kidneys are unremarkable.
The urinary bladder is negative. The endometrium measures 2.1 cm in
thickness. The left ovary is not well visualized. Right ovary
appears normal.

The abdominal aorta appears normal in caliber. No aneurysm. Small
aortocaval lymph node measures 5 mm, image 73/series 2. No enlarged
pelvic or inguinal lymph nodes. There is no ascites within the
abdomen or pelvis. No peritoneal nodule or mass identified. The
stomach is normal. The small bowel loops have a normal course and
caliber. No obstruction. The colon is within normal limits.

Review of the visualized bony structures is negative for aggressive
lytic or sclerotic bone lesion.
IMPRESSION: 1. The endometrium is thickened measuring 21 mm compatible with the
history of endometrial carcinoma. No evidence for metastatic disease
to the chest abdomen or pelvis.
2. Liver cyst
3. Low attenuation structure within the spleen is nonspecific.
Solitary splenic lesions are typically benign. This is incompletely
visualize on the delayed images but is favored to represent a benign
hemangioma.
4. 3 mm right lung nodule. If the patient is at high risk for
bronchogenic carcinoma, follow-up chest CT at 1 year is recommended.
If the patient is at low risk, no follow-up is needed. This
recommendation follows the consensus statement: Guidelines for
Management of Small Pulmonary Nodules Detected on CT Scans: A
Statement from the [HOSPITAL] as published in Radiology

## 2015-05-03 ENCOUNTER — Ambulatory Visit (INDEPENDENT_AMBULATORY_CARE_PROVIDER_SITE_OTHER): Payer: BLUE CROSS/BLUE SHIELD | Admitting: Family Medicine

## 2015-05-03 ENCOUNTER — Encounter: Payer: Self-pay | Admitting: Family Medicine

## 2015-05-03 VITALS — BP 138/84 | HR 84 | Temp 98.0°F | Resp 16 | Ht 64.0 in | Wt 143.0 lb

## 2015-05-03 DIAGNOSIS — N390 Urinary tract infection, site not specified: Secondary | ICD-10-CM | POA: Diagnosis not present

## 2015-05-03 LAB — POC URINALSYSI DIPSTICK (AUTOMATED)
BILIRUBIN UA: NEGATIVE
Blood, UA: NEGATIVE
Glucose, UA: NEGATIVE
KETONES UA: NEGATIVE
Nitrite, UA: NEGATIVE
PH UA: 6
PROTEIN UA: NEGATIVE
SPEC GRAV UA: 1.01
Urobilinogen, UA: 0.2

## 2015-05-03 MED ORDER — CIPROFLOXACIN HCL 500 MG PO TABS: 500.0000 mg | ORAL_TABLET | Freq: Two times a day (BID) | ORAL | Status: DC

## 2015-05-03 NOTE — Progress Notes (Signed)
OFFICE VISIT  05/03/2015   CC:  Chief Complaint  Patient presents with  . Urinary Tract Infection    x 2 weeks   HPI:    Patient is a 65 y.o. Caucasian female who presents for "UTI".  Onset of sx's 2 wks ago, staying steady but not worsening. Urinary frequency, urgency, some leakage lately. Often urinates and then feels like she has to go immediately again.  No vag d/c.  No dysuria.   Some lower left flank pain recently.  No fevers.  No nausea.  No gross hematuria.  Past Medical History  Diagnosis Date  . Osteoarthritis     LB, HIPs, knees (L knee end stage), hands  . Seasonal allergic rhinitis   . Cerebral palsy ([place])   . Endometrial adenocarcinoma ([place]) 09/2013    Dr. Denman George: pt to get vaginal brachytherapy via rad onc  . Radiation 12/16/13, 12/22/13, 12/30/13, 01/06/14, 01/13/14    proximal vagina 30 gray  . Colon polyp, hyperplastic 2004  . Morton's neuroma 2012    Dr. Roseanne Reno did injections in the past (left foot)  . Difficulty sleeping     Past Surgical History  Procedure Laterality Date  . Dilation and curettage of uterus    . Tonsillectomy    . Colonoscopy  09/2002 (Dr. Henrene Pastor)    hyperplastic; recall sent 2009 but pt apparently didn't respond.  . Tubal ligation    . Robotic assisted total hysterectomy with bilateral salpingo oopherectomy Bilateral 10/05/2013    Procedure: ROBOTIC ASSISTED TOTAL HYSTERECTOMY WITH BILATERAL SALPINGO OOPHORECTOMY WITH LYMPH NODE DISECTION;  Surgeon: Everitt Amber, MD;  Location: [facility];  Service: Gynecology;  Laterality: Bilateral;  . Leg surgery Left 01/2014    "cleaned out cartilage"  . Total knee arthroplasty Left 12/07/2014    Procedure: TOTAL KNEE ARTHROPLASTY;  Surgeon: Latanya Maudlin, MD;  Location: [facility];  Service: Orthopedics;  Laterality: Left;    Outpatient Prescriptions Prior to Visit  Medication Sig Dispense Refill  . aspirin 325 MG tablet Take 325 mg by mouth daily.    . beta carotene w/minerals (OCUVITE) tablet Take 1 tablet  by mouth every morning.     . cetirizine (ZYRTEC) 10 MG tablet Take 10 mg by mouth daily as needed for allergies.     Marland Kitchen docusate sodium (COLACE) 100 MG capsule Take 100 mg by mouth daily as needed for mild constipation.     . DULoxetine (CYMBALTA) 30 MG capsule TAKE ONE CAPSULE EVERY DAY FOR MOOD AND PAIN    . gabapentin (NEURONTIN) 300 MG capsule Take 1 tab po qhs (Patient taking differently: Take 300 mg by mouth at bedtime. ) 90 capsule 3  . polyethylene glycol (MIRALAX / GLYCOLAX) packet Take 17 g by mouth daily as needed for mild constipation.     . Probiotic Product (PROBIOTIC PO) Take 1 tablet by mouth every morning.    . traMADol (ULTRAM) 50 MG tablet Take 1-2 tablets (50-100 mg total) by mouth every 6 (six) hours as needed for moderate pain. 60 tablet 0  . fluticasone (FLONASE) 50 MCG/ACT nasal spray Place 2 sprays into both nostrils daily. (Patient not taking: Reported on 02/15/2015) 16 g 6  . methocarbamol (ROBAXIN) 500 MG tablet Take 1 tablet (500 mg total) by mouth every 6 (six) hours as needed for muscle spasms. (Patient not taking: Reported on 05/03/2015) 40 tablet 1  . ondansetron (ZOFRAN) 4 MG tablet Take 1 tablet (4 mg total) by mouth every 6 (six) hours as needed for  nausea. (Patient not taking: Reported on 02/15/2015) 40 tablet 0   No facility-administered medications prior to visit.    No Known Allergies  ROS As per HPI  PE: Blood pressure 138/84, pulse 84, temperature 98 F (36.7 C), temperature source Oral, resp. rate 16, height 5\' 4"  (1.626 m), weight 143 lb (64.864 kg), SpO2 94 %. Gen: Alert, well appearing.  Patient is oriented to person, place, time, and situation. CV: RRR, no m/r/g.   LUNGS: CTA bilat, nonlabored resps, good aeration in all lung fields. BACK: no CVA or flank tenderness  LABS:  UA today: trace LEU, otherwise normal.  IMPRESSION AND PLAN:  UTI, sx's for 2 weeks now. Cipro 500 mg bid x 5d. Send urine for c/s.  An After Visit Summary was  printed and given to the patient.  FOLLOW UP: Return if symptoms worsen or fail to improve.

## 2015-05-03 NOTE — Progress Notes (Signed)
Pre visit review using our clinic review tool, if applicable. No additional management support is needed unless otherwise documented below in the visit note. 

## 2015-05-05 LAB — URINE CULTURE: Colony Count: 30000

## 2015-05-18 ENCOUNTER — Ambulatory Visit: Payer: Self-pay | Admitting: Radiation Oncology

## 2015-06-01 ENCOUNTER — Ambulatory Visit
Admission: RE | Admit: 2015-06-01 | Discharge: 2015-06-01 | Disposition: A | Discharge status: Home / Self Care | Payer: BLUE CROSS/BLUE SHIELD | Source: Ambulatory Visit | Attending: Radiation Oncology | Admitting: Radiation Oncology

## 2015-06-01 ENCOUNTER — Encounter: Payer: Self-pay | Admitting: Radiation Oncology

## 2015-06-01 VITALS — BP 136/76 | HR 66 | Temp 97.8°F | Resp 12 | Wt 144.0 lb

## 2015-06-01 DIAGNOSIS — C541 Malignant neoplasm of endometrium: Secondary | ICD-10-CM

## 2015-06-01 NOTE — Progress Notes (Signed)
Radiation Oncology         [phone] ________________________________  Name: Kelsey Bruce MRN: [id number]  Date: 06/01/2015  DOB: 1950/10/11  Follow-Up Visit Note  CC: Kelsey Sou, MD  Everitt Amber, MD    ICD-9-CM ICD-10-CM   1. Endometrial cancer, grade I ([facility]) 182.0 C54.1     Diagnosis: Kelsey Bruce is a 65 year old female presenting to clinic in regards to her Stage IB Grade II endometrioid adenocarcinoma.   Interval Since Last Radiation:  16  months, she completed vaginal cuff radiation therapy  Narrative:   PAIN: She is currently in no pain. URINARY: Reports urinary frequency, urinary incontinence and reports having a UTI approximately 3, which was treated- denies pain during urination. Pt states they urinate 1 - 2 times per night. Incontinence is a chronic problem. She does not wish to be evaluated for this issue, mild intermittent,  VAGINAL: Pt reports vaginal symptoms of perineal odor. Denies bleeding, discharge, pain or itching.  BOWEL: Bowel movement every other day, soft.  OTHER: Reports in February, Meloxicam made her "sick to her stomach" so she stopped talking all of her medications.Appetite good. Her last PAP was 11/10/14. Next appointment with Dr. Denman George 08/17/15. BP 136/76 mmHg  Pulse 66  Temp(Src) 97.8 F (36.6 C) (Oral)  Resp 12  Wt 144 lb (65.318 kg)  SpO2 98% Wt Readings from Last 3 Encounters:  06/01/15 144 lb (65.318 kg)  05/03/15 143 lb (64.864 kg)  02/15/15 144 lb 14.4 oz (65.726 kg)        ALLERGIES:  has No Known Allergies.  Meds: Current Outpatient Prescriptions  Medication Sig Dispense Refill  . aspirin 325 MG tablet Take 325 mg by mouth daily. Reported on 06/01/2015    . beta carotene w/minerals (OCUVITE) tablet Take 1 tablet by mouth every morning. Reported on 06/01/2015    . cetirizine (ZYRTEC) 10 MG tablet Take 10 mg by mouth daily as needed for allergies. Reported on 06/01/2015    . ciprofloxacin (CIPRO) 500 MG  tablet Take 1 tablet (500 mg total) by mouth 2 (two) times daily. (Patient not taking: Reported on 06/01/2015) 10 tablet 0  . docusate sodium (COLACE) 100 MG capsule Take 100 mg by mouth daily as needed for mild constipation. Reported on 06/01/2015    . DULoxetine (CYMBALTA) 30 MG capsule Reported on 06/01/2015    . gabapentin (NEURONTIN) 300 MG capsule Take 1 tab po qhs (Patient not taking: Reported on 06/01/2015) 90 capsule 3  . meloxicam (MOBIC) 15 MG tablet Take 15 mg by mouth daily. Reported on 06/01/2015  2  . polyethylene glycol (MIRALAX / GLYCOLAX) packet Take 17 g by mouth daily as needed for mild constipation. Reported on 06/01/2015    . Probiotic Product (PROBIOTIC PO) Take 1 tablet by mouth every morning. Reported on 06/01/2015    . traMADol (ULTRAM) 50 MG tablet Take 1-2 tablets (50-100 mg total) by mouth every 6 (six) hours as needed for moderate pain. (Patient not taking: Reported on 06/01/2015) 60 tablet 0   No current facility-administered medications for this encounter.    Physical Findings  weight is 144 lb (65.318 kg). Her oral temperature is 97.8 F (36.6 C). Her blood pressure is 136/76 and her pulse is 66. Her respiration is 12 and oxygen saturation is 98%.  The patient's lungs are clear, heart has regular rate and rhythm, and no palpable cervical, supraclavicular, or axillary adenopathy to be noted. The abdomen is soft and nontender with normal bowel  sounds. The inguinal areas are free of adenopathy. On pelvic examination the external genitalia are unremarkable. A speculum exam is performed. There are no mucosal lesions noted in the vaginal vault. No palpable mass felt on bimanual/recto-vaginal exam. Good sphincter tone.  Lab Findings: Lab Results  Component Value Date   WBC 8.5 12/09/2014   HGB 11.7* 12/09/2014   HCT 35.5* 12/09/2014   MCV 89.0 12/09/2014   PLT 197 12/09/2014    Radiographic Findings: No results found.  Impression: Kelsey Bruce is a 65 year old female  presenting to clinic in regards to her Stage IB Grade II endometrioid adenocarcinoma. No evidence of disease recurrence on clinical exam.   Plan: Radiation oncology follow up in 6 months, gyn/onc f/u in ~ 3 months.  ____________________________________   Blair Promise, PhD, MD   This document serves as a record of services personally performed by Gery Pray, MD. It was created on his behalf by Derek Mound, a trained medical scribe. The creation of this record is based on the scribe's personal observations and the provider's statements to them. This document has been checked and approved by the attending provider.

## 2015-06-01 NOTE — Progress Notes (Signed)
PAIN: She is currently in no pain. URINARY: Reports urinary frequency, urinary incontinence and reports having a UTI approximately 3, which was treated- denies pain during urination.  Pt states they urinate 1 - 2 times per night.   VAGINAL: Pt reports vaginal symptoms of perineal odor.  Denies bleeding, discharge, pain or itching.  BOWEL: Bowel movement every other day, soft.  OTHER: Reports in February, Meloxicam made her "sick to her stomach" so she stopped talking all of her medications.  Her last PAP was 11/10/14.  Next appointment with Dr. Denman George 08/17/15.   BP 136/76 mmHg  Pulse 66  Temp(Src) 97.8 F (36.6 C) (Oral)  Resp 12  Wt 144 lb (65.318 kg)  SpO2 98% Wt Readings from Last 3 Encounters:  06/01/15 144 lb (65.318 kg)  05/03/15 143 lb (64.864 kg)  02/15/15 144 lb 14.4 oz (65.726 kg)

## 2015-06-23 ENCOUNTER — Telehealth: Payer: Self-pay

## 2015-06-23 NOTE — Telephone Encounter (Signed)
Left vmail requesting status of flu vaccine.

## 2015-08-17 ENCOUNTER — Ambulatory Visit: Payer: BLUE CROSS/BLUE SHIELD | Attending: Gynecologic Oncology | Admitting: Gynecologic Oncology

## 2015-08-17 ENCOUNTER — Encounter: Payer: Self-pay | Admitting: Gynecologic Oncology

## 2015-08-17 VITALS — BP 146/88 | HR 69 | Temp 98.0°F | Resp 18 | Ht 64.0 in | Wt 144.4 lb

## 2015-08-17 DIAGNOSIS — Z8249 Family history of ischemic heart disease and other diseases of the circulatory system: Secondary | ICD-10-CM | POA: Insufficient documentation

## 2015-08-17 DIAGNOSIS — Z7982 Long term (current) use of aspirin: Secondary | ICD-10-CM | POA: Insufficient documentation

## 2015-08-17 DIAGNOSIS — G809 Cerebral palsy, unspecified: Secondary | ICD-10-CM | POA: Diagnosis not present

## 2015-08-17 DIAGNOSIS — R233 Spontaneous ecchymoses: Secondary | ICD-10-CM

## 2015-08-17 DIAGNOSIS — C541 Malignant neoplasm of endometrium: Secondary | ICD-10-CM | POA: Insufficient documentation

## 2015-08-17 DIAGNOSIS — Z90710 Acquired absence of both cervix and uterus: Secondary | ICD-10-CM | POA: Diagnosis not present

## 2015-08-17 DIAGNOSIS — Z801 Family history of malignant neoplasm of trachea, bronchus and lung: Secondary | ICD-10-CM | POA: Diagnosis present

## 2015-08-17 DIAGNOSIS — Z923 Personal history of irradiation: Secondary | ICD-10-CM | POA: Diagnosis not present

## 2015-08-17 DIAGNOSIS — M1990 Unspecified osteoarthritis, unspecified site: Secondary | ICD-10-CM | POA: Insufficient documentation

## 2015-08-17 DIAGNOSIS — Z96652 Presence of left artificial knee joint: Secondary | ICD-10-CM | POA: Diagnosis present

## 2015-08-17 DIAGNOSIS — Z8542 Personal history of malignant neoplasm of other parts of uterus: Secondary | ICD-10-CM | POA: Diagnosis not present

## 2015-08-17 NOTE — Patient Instructions (Signed)
Follow up in December as scheduled, call with any changes , questions or concerns. Thank you!

## 2015-08-17 NOTE — Progress Notes (Signed)
GYN ONC FOLLOWUP  Assessment:    65 y.o. 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   S/p robotic hysterectomy, BSO, lymphadenectomy on 10/05/13. + LVSI, 80% myometrial invasion, negative pelvic washings and negative lymph nodes. She has completed her vaginal cuff brachytherapy with her last treatment on January 13, 2014. She's doing very well post radiation with the normal expected side effects.   NED on today's exam.  Plan: 1) Follow-up with Dr. Sondra Come in 3 months (September) and return to see me in 6 months (December). 2) recommend CBC and coag screen given her easy bruising. Offered this to be drawn today but she declined due to running late for another appointment and will bring it up with her PCP, Dr Ernestine Conrad.  HPI:  Kelsey Bruce is a 65 y.o. year old G2P2002 initially seen in consultation on 6/26 for endometrial cancer referred by Dr Hulan Fray.  She then underwent a robotic assisted total hysterectomy, BSO and pelvic lymphadenectomy on [id number] without complications.  Her postoperative course was uncomplicated.  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 She was recommended to receive adjuvant vaginal brachytherapy to reduce the risk for local recurrence based on her high/intermediate risk factors.   Pathology report 10/05/13:  1. Uterus +/- tubes/ovaries, neoplastic, with cervix - INVASIVE ENDOMETRIOID CARCINOMA, FIGO GRADE II, INVADING INTO OUTER HALF OF THE MYOMETRIUM WITH ANGIOLYMPHATIC INVASION PRESENT. - CERVIX: BENIGN SQUAMOUS MUCOSA AND ENDOCERVICAL MUCOSA, NO DYSPLASIA OR MALIGNANCY. - BILATERAL OVARIES AND FALLOPIAN TUBES: BENIGN OVARIAN TISSUE AND FALLOPIAN TUBAL TISSUE, NO EVIDENCE OF MALIGNANCY. - PLEASE SEE ONCOLOGY TEMPLATE FOR DETAILS. 2. Lymph nodes, regional resection, left pelvic - SIX LYMPH NODES, NEGATIVE  FOR METASTATIC CARCINOMA (0/6). 3. Lymph nodes, regional resection, right pelvic - THIRTEEN LYMPH NODES, NEGATIVE FOR METASTATIC CARCINOMA (0/13). Microscopic Comment 1. ONCOLOGY TABLE-UTERUS, CARCINOMA Specimen: Uterus, cervix, bilateral ovaries and fallopian tubes. Procedure: Total hysterectomy and bilateral salpingo-oophorectomy. Lymph node sampling performed: Yes Specimen integrity: Intact. Maximum tumor size: 4.0 cm, gross measurement. Histologic type: Invasive endometrioid carcinoma. Grade: FIGO Grade II Myometrial invasion: 1.2 cm where myometrium is 1.5 cm in thickness Cervical stromal involvement: No Extent of involvement of other organs: No Lymph - vascular invasion: Present. Peritoneal washings: Negative [id number] Lymph nodes: # examined 19 ; # positive 0 Pelvic lymph nodes: 0 involved of 19 lymph nodes. Para-aortic lymph nodes:   Interval Hx:  She underwent left TKA in 2016 and is doing much better from this. She denies symptoms concerning for recurrent endometrial cancer.  Review of systems: Constitutional:  She has no weight gain or weight loss. She has no fever or chills. Eyes: No blurred vision sores. Cardiovascular: No chest pain or edema. Respiratory:  No shortness of breath Gastrointestinal: She has normal bowel movements without diarrhea or constipation. She denies any nausea or vomiting. She denies blood in her stool or heart burn. Genitourinary:  She denies pelvic pain, pelvic pressure or changes in her urinary function. She has no hematuria, dysuria, or incontinence. She has no irregular vaginal bleeding or vaginal discharge Musculoskeletal: no left knee pain Skin:  Resolved vulvar itching Psychiatric:  She denies depression or anxiety. Hematologic/Lymphatic:   No easy bruising or bleeding  No Known Allergies Outpatient Encounter Prescriptions as of 08/17/2015  Medication Sig  . aspirin 325 MG tablet Take 325 mg by mouth daily. Reported on 06/01/2015  .  beta  carotene w/minerals (OCUVITE) tablet Take 1 tablet by mouth every morning. Reported on 06/01/2015  . cetirizine (ZYRTEC) 10 MG tablet Take 10 mg by mouth daily as needed for allergies. Reported on 06/01/2015  . docusate sodium (COLACE) 100 MG capsule Take 100 mg by mouth daily as needed for mild constipation. Reported on 06/01/2015  . meloxicam (MOBIC) 15 MG tablet Take 15 mg by mouth daily. Reported on 06/01/2015  . polyethylene glycol (MIRALAX / GLYCOLAX) packet Take 17 g by mouth daily as needed for mild constipation. Reported on 06/01/2015  . Probiotic Product (PROBIOTIC PO) Take 1 tablet by mouth every morning. Reported on 06/01/2015  . gabapentin (NEURONTIN) 300 MG capsule Take 1 tab po qhs (Patient not taking: Reported on 06/01/2015)  . traMADol (ULTRAM) 50 MG tablet Take 1-2 tablets (50-100 mg total) by mouth every 6 (six) hours as needed for moderate pain. (Patient not taking: Reported on 06/01/2015)  . [DISCONTINUED] ciprofloxacin (CIPRO) 500 MG tablet Take 1 tablet (500 mg total) by mouth 2 (two) times daily. (Patient not taking: Reported on 06/01/2015)  . [DISCONTINUED] DULoxetine (CYMBALTA) 30 MG capsule Reported on 06/01/2015   No facility-administered encounter medications on file as of 08/17/2015.   Past Medical History  Diagnosis Date  . Osteoarthritis     LB, HIPs, knees (L knee end stage), hands  . Seasonal allergic rhinitis   . Cerebral palsy ([place])   . Endometrial adenocarcinoma ([place]) 09/2013    Dr. Denman George: pt to get vaginal brachytherapy via rad onc  . Radiation 12/16/13, 12/22/13, 12/30/13, 01/06/14, 01/13/14    proximal vagina 30 gray  . Colon polyp, hyperplastic 2004  . Morton's neuroma 2012    Dr. Roseanne Reno did injections in the past (left foot)  . Difficulty sleeping    Oncology History   65 year old woman with stage IB grade 2 endometriod endometrial adenocarcinoma with high/intermediate risk factors. Recommended to receive adjuvant vaginal brachytherapy after definitive staging to  reduce the risk for recurrence.     Endometrial cancer, grade I ([place])   09/06/2013 Initial Diagnosis Endometrial cancer, grade I   10/05/2013 Surgery robotic assisted total hysterctomy, BSO, pelvic lymphadenectomy   12/16/2013 - 01/13/2014 Radiation Therapy Vaginal cuff brachytherapy   Past Surgical History  Procedure Laterality Date  . Dilation and curettage of uterus    . Tonsillectomy    . Colonoscopy  09/2002 (Dr. Henrene Pastor)    hyperplastic; recall sent 2009 but pt apparently didn't respond.  . Tubal ligation    . Robotic assisted total hysterectomy with bilateral salpingo oopherectomy Bilateral 10/05/2013    Procedure: ROBOTIC ASSISTED TOTAL HYSTERECTOMY WITH BILATERAL SALPINGO OOPHORECTOMY WITH LYMPH NODE DISECTION;  Surgeon: Everitt Amber, MD;  Location: [facility];  Service: Gynecology;  Laterality: Bilateral;  . Leg surgery Left 01/2014    "cleaned out cartilage"  . Total knee arthroplasty Left 12/07/2014    Procedure: TOTAL KNEE ARTHROPLASTY;  Surgeon: Latanya Maudlin, MD;  Location: [facility];  Service: Orthopedics;  Laterality: Left;   Family History  Problem Relation Age of Onset  . Heart disease Father   . Hypertension Father   . Cancer Father     lung  . Arthritis Father   . Alcohol abuse Father   . Heart disease Mother   . Cancer Mother     lung  . Deep vein thrombosis Mother   . Alcohol abuse Mother   . Heart disease Brother   . Hypertension Brother   . Heart disease Brother   .  Hypertension Brother   . Heart disease Brother   . Hypertension Brother    Social History   Social History  . Marital Status: Divorced    Spouse Name: N/A  . Number of Children: 2  . Years of Education: N/A   Occupational History  . retired    Social History Main Topics  . Smoking status: Never Smoker   . Smokeless tobacco: Never Used  . Alcohol Use: Yes     Comment: occasional  . Drug Use: No  . Sexual Activity: No   Other Topics Concern  . None   Social History Narrative    Divorced.  Two children.   Orig from [place], [place].   HS education.   Works at [employer] in [place].   No tobacco, occ alcohol, no drugs.   Has 3 brothers and they all smoked, as did her parents.    Physical Exam: Blood pressure 146/88, pulse 69, temperature 98 F (36.7 C), temperature source Oral, resp. rate 18, height 5\' 4"  (1.626 m), weight 144 lb 6.4 oz (65.499 kg), SpO2 98 %. General: Well dressed, well nourished in no apparent distress.    Lungs:  Clear to auscultation bilaterally.  No wheezes.  Cardiovascular:  Regular rate and rhythm.   Abdomen:  Soft, nontender, nondistended.  No palpable masses.  No hepatosplenomegaly.  No ascites. Normal bowel sounds.  No hernias.  Incisions are well healed.  Genitourinary: Normal EGBUS  Vaginal cuff intact. No visible lesions, atrophic.  No bleeding or discharge.  No masses or nodularity, rectal confirms. Petechial changes at vaginal cuff.  Extremities: No cyanosis, clubbing or edema.   Psychiatric: Mood and affect are appropriate.  Donaciano Eva, MD   CC: Dr Anitra Lauth, Dr Sondra Come, Dr Hulan Fray

## 2015-09-18 ENCOUNTER — Other Ambulatory Visit: Payer: Self-pay | Admitting: Gynecologic Oncology

## 2015-09-18 DIAGNOSIS — R233 Spontaneous ecchymoses: Secondary | ICD-10-CM

## 2015-09-18 DIAGNOSIS — R238 Other skin changes: Secondary | ICD-10-CM

## 2015-09-27 ENCOUNTER — Other Ambulatory Visit (INDEPENDENT_AMBULATORY_CARE_PROVIDER_SITE_OTHER): Payer: BLUE CROSS/BLUE SHIELD

## 2015-09-27 DIAGNOSIS — R238 Other skin changes: Secondary | ICD-10-CM

## 2015-09-27 DIAGNOSIS — R233 Spontaneous ecchymoses: Secondary | ICD-10-CM

## 2015-09-27 LAB — CBC WITH DIFFERENTIAL/PLATELET
BASOS ABS: 0.1 10*3/uL (ref 0.0–0.1)
Basophils Relative: 0.8 % (ref 0.0–3.0)
EOS ABS: 0.1 10*3/uL (ref 0.0–0.7)
Eosinophils Relative: 2.1 % (ref 0.0–5.0)
HEMATOCRIT: 42.4 % (ref 36.0–46.0)
HEMOGLOBIN: 14.2 g/dL (ref 12.0–15.0)
LYMPHS PCT: 38.3 % (ref 12.0–46.0)
Lymphs Abs: 2.4 10*3/uL (ref 0.7–4.0)
MCHC: 33.5 g/dL (ref 30.0–36.0)
MCV: 88.2 fl (ref 78.0–100.0)
Monocytes Absolute: 0.5 10*3/uL (ref 0.1–1.0)
Monocytes Relative: 8.5 % (ref 3.0–12.0)
NEUTROS ABS: 3.1 10*3/uL (ref 1.4–7.7)
Neutrophils Relative %: 50.3 % (ref 43.0–77.0)
PLATELETS: 280 10*3/uL (ref 150.0–400.0)
RBC: 4.8 Mil/uL (ref 3.87–5.11)
RDW: 13.6 % (ref 11.5–15.5)
WBC: 6.2 10*3/uL (ref 4.0–10.5)

## 2015-09-27 LAB — PROTIME-INR
INR: 1 ratio (ref 0.8–1.0)
PROTHROMBIN TIME: 10.8 s (ref 9.6–13.1)

## 2015-09-27 LAB — APTT: APTT: 32.2 s (ref 23.4–32.7)

## 2015-09-28 ENCOUNTER — Telehealth: Payer: Self-pay

## 2015-09-28 NOTE — Telephone Encounter (Signed)
Orders received from [facility], APNP to contact the patient with CBC with Differential results all ( WNL ). Attempted to contact the patient with results , no answer , left a detailed message with call phone number if additional questions arise.

## 2015-10-02 ENCOUNTER — Encounter: Payer: BLUE CROSS/BLUE SHIELD | Admitting: Family Medicine

## 2015-10-06 ENCOUNTER — Ambulatory Visit (INDEPENDENT_AMBULATORY_CARE_PROVIDER_SITE_OTHER): Payer: BLUE CROSS/BLUE SHIELD | Admitting: Family Medicine

## 2015-10-06 ENCOUNTER — Encounter: Payer: Self-pay | Admitting: Family Medicine

## 2015-10-06 VITALS — BP 132/84 | HR 73 | Temp 97.2°F | Resp 16 | Ht 60.0 in | Wt 144.5 lb

## 2015-10-06 DIAGNOSIS — Z1211 Encounter for screening for malignant neoplasm of colon: Secondary | ICD-10-CM | POA: Diagnosis not present

## 2015-10-06 DIAGNOSIS — Z1239 Encounter for other screening for malignant neoplasm of breast: Secondary | ICD-10-CM

## 2015-10-06 DIAGNOSIS — E2839 Other primary ovarian failure: Secondary | ICD-10-CM

## 2015-10-06 DIAGNOSIS — Z0000 Encounter for general adult medical examination without abnormal findings: Secondary | ICD-10-CM | POA: Diagnosis not present

## 2015-10-06 LAB — LIPID PANEL
CHOLESTEROL: 207 mg/dL — AB (ref 0–200)
HDL: 44.8 mg/dL (ref >39.00)
LDL Cholesterol: 138 mg/dL — ABNORMAL HIGH (ref 0–99)
NONHDL: 162.23
TRIGLYCERIDES: 119 mg/dL (ref 0.0–149.0)
Total CHOL/HDL Ratio: 5
VLDL: 23.8 mg/dL (ref 0.0–40.0)

## 2015-10-06 LAB — CBC WITH DIFFERENTIAL/PLATELET
BASOS ABS: 0.1 10*3/uL (ref 0.0–0.1)
BASOS PCT: 0.9 % (ref 0.0–3.0)
Eosinophils Absolute: 0.1 10*3/uL (ref 0.0–0.7)
Eosinophils Relative: 1.9 % (ref 0.0–5.0)
HCT: 43.3 % (ref 36.0–46.0)
HEMOGLOBIN: 14.6 g/dL (ref 12.0–15.0)
LYMPHS PCT: 34.5 % (ref 12.0–46.0)
Lymphs Abs: 2.4 10*3/uL (ref 0.7–4.0)
MCHC: 33.7 g/dL (ref 30.0–36.0)
MCV: 87.8 fl (ref 78.0–100.0)
MONO ABS: 0.6 10*3/uL (ref 0.1–1.0)
MONOS PCT: 8.6 % (ref 3.0–12.0)
NEUTROS ABS: 3.7 10*3/uL (ref 1.4–7.7)
Neutrophils Relative %: 54.1 % (ref 43.0–77.0)
Platelets: 320 10*3/uL (ref 150.0–400.0)
RBC: 4.93 Mil/uL (ref 3.87–5.11)
RDW: 13.9 % (ref 11.5–15.5)
WBC: 6.8 10*3/uL (ref 4.0–10.5)

## 2015-10-06 LAB — TSH: TSH: 3.06 u[IU]/mL (ref 0.35–4.50)

## 2015-10-06 LAB — COMPREHENSIVE METABOLIC PANEL
ALBUMIN: 4.3 g/dL (ref 3.5–5.2)
ALK PHOS: 65 U/L (ref 39–117)
ALT: 14 U/L (ref 0–35)
AST: 15 U/L (ref 0–37)
BILIRUBIN TOTAL: 0.6 mg/dL (ref 0.2–1.2)
BUN: 17 mg/dL (ref 6–23)
CALCIUM: 9.6 mg/dL (ref 8.4–10.5)
CO2: 28 mEq/L (ref 19–32)
Chloride: 105 mEq/L (ref 96–112)
Creatinine, Ser: 0.77 mg/dL (ref 0.40–1.20)
GFR: 80.19 mL/min (ref >60.00)
Glucose, Bld: 85 mg/dL (ref 70–99)
POTASSIUM: 4.5 meq/L (ref 3.5–5.1)
Sodium: 143 mEq/L (ref 135–145)
TOTAL PROTEIN: 7.1 g/dL (ref 6.0–8.3)

## 2015-10-06 NOTE — Progress Notes (Signed)
Pre visit review using our clinic review tool, if applicable. No additional management support is needed unless otherwise documented below in the visit note. 

## 2015-10-06 NOTE — Progress Notes (Signed)
Office Note 10/06/2015  CC:  Chief Complaint  Patient presents with  . Annual Exam    Pt is fasting.     HPI:  Kelsey Bruce is a 65 y.o. White female who is here for annual health maintenance exam. Eye exam last year. Recent GYN ONC f/u was good.  Recovered from her TKA pretty well--she is very glad she got the surgery. She walks to stay as active as possible, uses a cane some of the time.   Past Medical History  Diagnosis Date  . Osteoarthritis     LB, HIPs, knees (L knee end stage), hands  . Seasonal allergic rhinitis   . Cerebral palsy ([place])   . Endometrial adenocarcinoma ([place]) 09/2013    Dr. Denman George: pt to get vaginal brachytherapy via rad onc  . Radiation 12/16/13, 12/22/13, 12/30/13, 01/06/14, 01/13/14    proximal vagina 30 gray  . Colon polyp, hyperplastic 2004  . Morton's neuroma 2012    Dr. Roseanne Reno did injections in the past (left foot)  . Difficulty sleeping     Past Surgical History  Procedure Laterality Date  . Dilation and curettage of uterus    . Tonsillectomy    . Colonoscopy  09/2002 (Dr. Henrene Pastor)    hyperplastic; recall sent 2009 but pt apparently didn't respond.  . Tubal ligation    . Robotic assisted total hysterectomy with bilateral salpingo oopherectomy Bilateral 10/05/2013    Procedure: ROBOTIC ASSISTED TOTAL HYSTERECTOMY WITH BILATERAL SALPINGO OOPHORECTOMY WITH LYMPH NODE DISECTION;  Surgeon: Everitt Amber, MD;  Location: [facility];  Service: Gynecology;  Laterality: Bilateral;  . Leg surgery Left 01/2014    "cleaned out cartilage"  . Total knee arthroplasty Left 12/07/2014    Procedure: TOTAL KNEE ARTHROPLASTY;  Surgeon: Latanya Maudlin, MD;  Location: [facility];  Service: Orthopedics;  Laterality: Left;  . Dexa  08/2007    T score -1.2    Family History  Problem Relation Age of Onset  . Heart disease Father   . Hypertension Father   . Cancer Father     lung  . Arthritis Father   . Alcohol abuse Father   . Heart disease Mother   . Cancer Mother      lung  . Deep vein thrombosis Mother   . Alcohol abuse Mother   . Heart disease Brother   . Hypertension Brother   . Heart disease Brother   . Hypertension Brother   . Heart disease Brother   . Hypertension Brother     Social History   Social History  . Marital Status: Divorced    Spouse Name: N/A  . Number of Children: 2  . Years of Education: N/A   Occupational History  . retired    Social History Main Topics  . Smoking status: Never Smoker   . Smokeless tobacco: Never Used  . Alcohol Use: Yes     Comment: occasional  . Drug Use: No  . Sexual Activity: No   Other Topics Concern  . Not on file   Social History Narrative   Divorced.  Two children.   Orig from [place], [place].   HS education.   Retired.   No tobacco, occ alcohol, no drugs.   Has 3 brothers and they all smoked, as did her parents.    Outpatient Prescriptions Prior to Visit  Medication Sig Dispense Refill  . beta carotene w/minerals (OCUVITE) tablet Take 1 tablet by mouth every morning. Reported on 06/01/2015    . cetirizine (ZYRTEC)  10 MG tablet Take 10 mg by mouth daily as needed for allergies. Reported on 06/01/2015    . docusate sodium (COLACE) 100 MG capsule Take 100 mg by mouth daily as needed for mild constipation. Reported on 06/01/2015    . meloxicam (MOBIC) 15 MG tablet Take 15 mg by mouth daily. Reported on 06/01/2015  2  . polyethylene glycol (MIRALAX / GLYCOLAX) packet Take 17 g by mouth daily as needed for mild constipation. Reported on 06/01/2015    . Probiotic Product (PROBIOTIC PO) Take 1 tablet by mouth every morning. Reported on 06/01/2015    . traMADol (ULTRAM) 50 MG tablet Take 1-2 tablets (50-100 mg total) by mouth every 6 (six) hours as needed for moderate pain. 60 tablet 0  . aspirin 325 MG tablet Take 325 mg by mouth daily. Reported on 10/06/2015    . gabapentin (NEURONTIN) 300 MG capsule Take 1 tab po qhs (Patient not taking: Reported on 06/01/2015) 90 capsule 3   No  facility-administered medications prior to visit.    Allergies  Allergen Reactions  . Aspirin Nausea Only    High dose only    ROS See below, under physical exam  PE; Blood pressure 132/84, pulse 73, temperature 97.2 F (36.2 C), temperature source Oral, resp. rate 16, height 5' (1.524 m), weight 144 lb 8 oz (65.545 kg), SpO2 96 %.  Pt examined with Sharen Hones, CMA, as chaperone.  Gen: Alert, well appearing.  Patient is oriented to person, place, time, and situation. AFFECT: pleasant, lucid thought and speech. ENT: Ears: EACs clear, normal epithelium.  TMs with good light reflex and landmarks bilaterally.  Eyes: no injection, icteris, swelling, or exudate.  EOMI, PERRLA. Nose: no drainage or turbinate edema/swelling.  No injection or focal lesion.  Mouth: lips without lesion/swelling.  Oral mucosa pink and moist.  Dentition intact and without obvious caries or gingival swelling.  Oropharynx without erythema, exudate, or swelling.  Neck: supple/nontender.  No LAD, mass, or TM.  Carotid pulses 2+ bilaterally, without bruits. CV: RRR, no m/r/g.   LUNGS: CTA bilat, nonlabored resps, good aeration in all lung fields. ABD: soft, NT, ND, BS normal.  No hepatospenomegaly or mass.  No bruits. EXT: no clubbing, cyanosis, or edema.  Musculoskeletal: no joint swelling, erythema, warmth, or tenderness.  ROM of all joints intact. Skin - no sores or suspicious lesions or rashes or color changes   Review of Systems  Constitutional: Negative for fever, chills, appetite change and fatigue.  HENT: Negative for congestion, dental problem, ear pain and sore throat.   Eyes: Negative for discharge, redness and visual disturbance.  Respiratory: Negative for cough, chest tightness, shortness of breath and wheezing.   Cardiovascular: Negative for chest pain, palpitations and leg swelling.  Gastrointestinal: Negative for nausea, vomiting, abdominal pain, diarrhea and blood in stool.  Genitourinary:  Negative for dysuria, urgency, frequency, hematuria, flank pain and difficulty urinating.  Musculoskeletal: Negative for myalgias, back pain, joint swelling, arthralgias and neck stiffness.  Skin: Negative for pallor and rash.  Neurological: Negative for dizziness, speech difficulty, weakness and headaches.  Hematological: Negative for adenopathy. Does not bruise/bleed easily.  Psychiatric/Behavioral: Negative for confusion and sleep disturbance. The patient is not nervous/anxious.     LABS:  Lab Results  Component Value Date   TSH 3.49 09/30/2014   Lab Results  Component Value Date   WBC 6.2 09/27/2015   HGB 14.2 09/27/2015   HCT 42.4 09/27/2015   MCV 88.2 09/27/2015   PLT 280.0 09/27/2015  Lab Results  Component Value Date   CREATININE 0.63 12/09/2014   BUN 14 12/09/2014   NA 140 12/09/2014   K 3.8 12/09/2014   CL 107 12/09/2014   CO2 26 12/09/2014   Lab Results  Component Value Date   ALT 21 11/30/2014   AST 26 11/30/2014   ALKPHOS 61 11/30/2014   BILITOT 0.5 11/30/2014   Lab Results  Component Value Date   CHOL 190 09/30/2014   Lab Results  Component Value Date   HDL 46.80 09/30/2014   Lab Results  Component Value Date   LDLCALC 126* 09/30/2014   Lab Results  Component Value Date   TRIG 88.0 09/30/2014   Lab Results  Component Value Date   CHOLHDL 4 09/30/2014    ASSESSMENT AND PLAN:   Health maintenance exam: Reviewed age and gender appropriate health maintenance issues (prudent diet, regular exercise, health risks of tobacco and excessive alcohol, use of seatbelts, fire alarms in home, use of sunscreen).  Also reviewed age and gender appropriate health screening as well as vaccine recommendations. She declined zostavax--wants to check insurance coverage. She declined HIV and hep C screening. Colon ca screening: she is open to getting her repeat colonoscopy now.  Will refer back to Dr. Henrene Pastor at [facility], who did her first colonoscopy back in  2004. Screening mammogram and DEXA ordered. Fasting CMET, TSH, and lipid panel drawn today.  An After Visit Summary was printed and given to the patient.  FOLLOW UP:  Return in about 1 year (around 10/05/2016) for annual CPE (fasting).  Signed:  Crissie Sickles, MD           10/06/2015

## 2015-10-07 ENCOUNTER — Encounter: Payer: Self-pay | Admitting: Family Medicine

## 2015-10-18 ENCOUNTER — Ambulatory Visit: Payer: BLUE CROSS/BLUE SHIELD

## 2015-10-18 ENCOUNTER — Other Ambulatory Visit: Payer: BLUE CROSS/BLUE SHIELD

## 2015-10-25 ENCOUNTER — Other Ambulatory Visit: Payer: BLUE CROSS/BLUE SHIELD

## 2015-10-25 ENCOUNTER — Ambulatory Visit: Payer: BLUE CROSS/BLUE SHIELD

## 2015-10-31 ENCOUNTER — Other Ambulatory Visit: Payer: BLUE CROSS/BLUE SHIELD

## 2015-10-31 ENCOUNTER — Ambulatory Visit: Payer: BLUE CROSS/BLUE SHIELD

## 2015-11-06 ENCOUNTER — Encounter: Payer: Self-pay | Admitting: Internal Medicine

## 2015-11-14 ENCOUNTER — Encounter: Payer: Self-pay | Admitting: Family Medicine

## 2015-11-14 ENCOUNTER — Ambulatory Visit
Admission: RE | Admit: 2015-11-14 | Discharge: 2015-11-14 | Disposition: A | Discharge status: Home / Self Care | Payer: BLUE CROSS/BLUE SHIELD | Source: Ambulatory Visit | Attending: Family Medicine | Admitting: Family Medicine

## 2015-11-14 DIAGNOSIS — E2839 Other primary ovarian failure: Secondary | ICD-10-CM

## 2015-11-14 DIAGNOSIS — Z1239 Encounter for other screening for malignant neoplasm of breast: Secondary | ICD-10-CM

## 2015-11-30 ENCOUNTER — Ambulatory Visit
Admission: RE | Admit: 2015-11-30 | Discharge: 2015-11-30 | Disposition: A | Discharge status: Home / Self Care | Payer: BLUE CROSS/BLUE SHIELD | Source: Ambulatory Visit | Attending: Radiation Oncology | Admitting: Radiation Oncology

## 2015-11-30 ENCOUNTER — Encounter: Payer: Self-pay | Admitting: Radiation Oncology

## 2015-11-30 ENCOUNTER — Other Ambulatory Visit (HOSPITAL_COMMUNITY)
Admission: RE | Admit: 2015-11-30 | Discharge: 2015-11-30 | Disposition: A | Discharge status: Home / Self Care | Payer: BLUE CROSS/BLUE SHIELD | Source: Ambulatory Visit | Attending: Radiation Oncology | Admitting: Radiation Oncology

## 2015-11-30 DIAGNOSIS — Z01411 Encounter for gynecological examination (general) (routine) with abnormal findings: Secondary | ICD-10-CM | POA: Diagnosis present

## 2015-11-30 DIAGNOSIS — C541 Malignant neoplasm of endometrium: Secondary | ICD-10-CM | POA: Insufficient documentation

## 2015-11-30 NOTE — Progress Notes (Signed)
Kelsey Bruce is here for follow up.  She denies having pain.  She continues to report having stress incontinence and wears a pad.  She reports having occasional constipation.  She denies having any vaginal/rectal bleeding, nausea and fatigue.  She is not using a vaginal dilator.    BP 138/77 (BP Location: Right Arm, Patient Position: Sitting)   Pulse 71   Temp 98.1 F (36.7 C) (Oral)   Ht 5' (1.524 m)   Wt 146 lb 3.2 oz (66.3 kg)   SpO2 100%   BMI 28.55 kg/m    Wt Readings from Last 3 Encounters:  11/30/15 146 lb 3.2 oz (66.3 kg)  10/06/15 144 lb 8 oz (65.5 kg)  08/17/15 144 lb 6.4 oz (65.5 kg)

## 2015-11-30 NOTE — Progress Notes (Signed)
Radiation Oncology         [phone] ________________________________  Name: Kelsey Bruce MRN: [id number]  Date: 11/30/2015  DOB: 31-Jul-1950  Follow-Up Visit Note  CC: Tammi Sou, MD  Everitt Amber, MD    ICD-9-CM ICD-10-CM   1. Endometrial cancer, grade I (HCC) 182.0 C54.1     Diagnosis: Stage IB Grade II endometrioid adenocarcinoma.   Interval Since Last Radiation: 1 year and 11 months  September 24, September 30th, October 8, October 15, January 13, 2014: Proximal vagina 30 gray in 5 fractions (6 gray per fraction) vaginal cuff brachytherapy.  Narrative:  The patient returns for a routine follow up. The patient's last Pap smear was on 11/10/14 and it was negative. Mammogram on 11/15/15 was negative. The patient saw Dr. Denman George on 08/17/15 and pelvic exam at that time was normal.  She denies having pain.  She continues to report having stress incontinence and wears a pad.  She reports having occasional constipation.  She denies having any vaginal/rectal bleeding, nausea, or fatigue.  She is not using a vaginal dilator.  ALLERGIES:  is allergic to aspirin.  Meds: Current Outpatient Prescriptions  Medication Sig Dispense Refill  . aspirin EC 81 MG tablet Take 81 mg by mouth daily.    . beta carotene w/minerals (OCUVITE) tablet Take 1 tablet by mouth every morning. Reported on 06/01/2015    . cetirizine (ZYRTEC) 10 MG tablet Take 10 mg by mouth daily as needed for allergies. Reported on 06/01/2015    . docusate sodium (COLACE) 100 MG capsule Take 100 mg by mouth daily as needed for mild constipation. Reported on 06/01/2015    . meloxicam (MOBIC) 15 MG tablet Take 15 mg by mouth daily. Reported on 06/01/2015  2  . polyethylene glycol (MIRALAX / GLYCOLAX) packet Take 17 g by mouth daily as needed for mild constipation. Reported on 06/01/2015    . Probiotic Product (PROBIOTIC PO) Take 1 tablet by mouth every morning. Reported on 06/01/2015    . traMADol (ULTRAM) 50 MG tablet Take 1-2 tablets  (50-100 mg total) by mouth every 6 (six) hours as needed for moderate pain. (Patient not taking: Reported on 11/30/2015) 60 tablet 0   No current facility-administered medications for this encounter.     Physical Findings  height is 5' (1.524 m) and weight is 146 lb 3.2 oz (66.3 kg). Her oral temperature is 98.1 F (36.7 C). Her blood pressure is 138/77 and her pulse is 71. Her oxygen saturation is 100%.   Lungs are clear to auscultation bilaterally. Heart has regular rate and rhythm. No palpable cervical, supraclavicular, or axillary adenopathy. Abdomen soft, non-tender, normal bowel sounds. The inguinal areas are free of adenopathy.  On pelvic examination the external genitalia were unremarkable. A speculum exam was performed. There are no mucosal lesions noted in the vaginal vault. A Pap smear WAS obtained of the proximal vagina. On bimanual and rectovaginal examination there were no pelvic masses appreciated.  Lab Findings: Lab Results  Component Value Date   WBC 6.8 10/06/2015   HGB 14.6 10/06/2015   HCT 43.3 10/06/2015   MCV 87.8 10/06/2015   PLT 320.0 10/06/2015    Radiographic Findings: Dg Bone Density  Result Date: 11/14/2015 EXAM: DUAL X-RAY ABSORPTIOMETRY (DXA) FOR BONE MINERAL DENSITY IMPRESSION: Referring Physician:  Tammi Sou PATIENT: Name: Kelsey Bruce Patient ID: [id number] Birth Date: 15-May-1950 Height: 59.8 in. Sex: Female Measured: 11/14/2015 Weight: 146.0 lbs. Indications: Bilateral Ovariectomy (65.51), Caucasian, Estrogen Deficient, Hysterectomy, Low  Calcium Intake (269.3) Postmenopausal Fractures:  nose Treatments: None ASSESSMENT: The BMD measured at Femur Neck Left is 0.816 g/cm2 with a T-score of -1.6. This patient is considered osteopenic according to [facility]) criteria. L-4 was excluded due to degenerative changes. Spine was not compared to prior study due to exclusion of vertebral bodies on current exam. There has been a  statistically significant decrease in BMD of Left hip since prior exam dated 09/17/2007. Site Region Measured Date Measured Age YA T-score BMD Significant CHANGE DualFemur Neck Left 11/14/2015 65 -1.6 0.816 g/cm2 * AP Spine L1-L3 11/14/2015 64.2 -0.8 1.082 g/cm2 World Health Organization [facility]) criteria for post-menopausal, Caucasian Women: Normal       T-score at or above -1 SD Osteopenia   T-score between -1 and -2.5 SD Osteoporosis T-score at or below -2.5 SD RECOMMENDATION: [place] recommends that FDA-approved medical therapies be considered in postmenopausal women and men age 84 or older with a: 1. Hip or vertebral (clinical or morphometric) fracture. 2. T-score of <-2.5 at the spine or hip. 3. Ten-year fracture probability by FRAX of 3% or greater for hip fracture or 20% or greater for major osteoporotic fracture. All treatment decisions require clinical judgment and consideration of individual patient factors, including patient preferences, co-morbidities, previous drug use, risk factors not captured in the FRAX model (e.g. falls, vitamin D deficiency, increased bone turnover, interval significant decline in bone density) and possible under - or over-estimation of fracture risk by FRAX. All patients should ensure an adequate intake of dietary calcium (1200 mg/d) and vitamin D (800 IU daily) unless contraindicated. FOLLOW-UP: People with diagnosed cases of osteoporosis or at high risk for fracture should have regular bone mineral density tests. For patients eligible for Medicare, routine testing is allowed once every 2 years. The testing frequency can be increased to one year for patients who have rapidly progressing disease, those who are receiving or discontinuing medical therapy to restore bone mass, or have additional risk factors. FRAX* 10-year Probability of Fracture Based on femoral neck BMD: DualFemur (Left) Major Osteoporotic Fracture: 8.9% Hip Fracture: 1.0% Population: [place]  (Caucasian) Risk Factors: None *FRAX is a Materials engineer of the [employer] for Metabolic Bone Disease, a World Pharmacologist (WHO) [facility]. ASSESSMENT: The probability of a major osteoporotic fracture is 8.9% within the next ten years. The probability of a hip fracture is 1.0% within the next ten years. Interpreted by: Peggye Fothergill, MD, CCD (Certified Clinical Densitometrist) Electronically Signed   By: Evangeline Dakin M.D.   On: 11/14/2015 11:20   Mm Digital Screening Bilateral  Result Date: 11/15/2015 CLINICAL DATA:  Screening. EXAM: DIGITAL SCREENING BILATERAL MAMMOGRAM WITH CAD COMPARISON:  Previous exam(s). ACR Breast Density Category b: There are scattered areas of fibroglandular density. FINDINGS: There are no findings suspicious for malignancy. Images were processed with CAD. IMPRESSION: No mammographic evidence of malignancy. A result letter of this screening mammogram will be mailed directly to the patient. RECOMMENDATION: Screening mammogram in one year. (Code:SM-B-01Y) BI-RADS CATEGORY  1: Negative. Electronically Signed   By: Lillia Mountain M.D.   On: 11/15/2015 11:36    Impression: Stage IB Grade II endometrioid adenocarcinoma. No evidence of disease recurrence on clinical exam. Pap smear pending.  Plan: Radiation oncology follow up in 6 months. The patient is scheduled to follow up gyn/onc in ~ 3 months (December 2017). ____________________________________   Blair Promise, PhD, MD  This document serves as a record of services personally  performed by Gery Pray, MD. It was created on his behalf by Darcus Austin, a trained medical scribe. The creation of this record is based on the scribe's personal observations and the provider's statements to them. This document has been checked and approved by the attending provider.

## 2015-12-05 LAB — CYTOLOGY - PAP

## 2015-12-07 ENCOUNTER — Telehealth: Payer: Self-pay | Admitting: Oncology

## 2015-12-07 NOTE — Telephone Encounter (Signed)
Called Kelsey Bruce and notified her that her pap smear results were normal per Dr. Sondra Come.  She verbalized agreement and understanding.

## 2015-12-21 ENCOUNTER — Encounter: Payer: Self-pay | Admitting: Internal Medicine

## 2015-12-21 ENCOUNTER — Ambulatory Visit (AMBULATORY_SURGERY_CENTER): Payer: Self-pay | Admitting: *Deleted

## 2015-12-21 VITALS — Ht 60.0 in | Wt 146.8 lb

## 2015-12-21 DIAGNOSIS — Z1211 Encounter for screening for malignant neoplasm of colon: Secondary | ICD-10-CM

## 2015-12-21 MED ORDER — NA SULFATE-K SULFATE-MG SULF 17.5-3.13-1.6 GM/177ML PO SOLN: ORAL | 0 refills | Status: DC

## 2015-12-21 NOTE — Progress Notes (Signed)
No home oxygen used or diet medications taken  No egg or soy allergy  No intubation or anesthesia problems per pt  Pt denies being in a research study  She states she only has very mild constipation- only takes Miralax or Colace about once every other week

## 2016-01-04 ENCOUNTER — Encounter: Payer: Self-pay | Admitting: Internal Medicine

## 2016-01-04 ENCOUNTER — Ambulatory Visit (AMBULATORY_SURGERY_CENTER): Payer: BLUE CROSS/BLUE SHIELD | Admitting: Internal Medicine

## 2016-01-04 VITALS — BP 112/59 | HR 62 | Temp 98.0°F | Resp 12 | Ht 60.0 in | Wt 146.0 lb

## 2016-01-04 DIAGNOSIS — Z1211 Encounter for screening for malignant neoplasm of colon: Secondary | ICD-10-CM

## 2016-01-04 DIAGNOSIS — D122 Benign neoplasm of ascending colon: Secondary | ICD-10-CM

## 2016-01-04 DIAGNOSIS — Z1212 Encounter for screening for malignant neoplasm of rectum: Secondary | ICD-10-CM

## 2016-01-04 MED ORDER — SODIUM CHLORIDE 0.9 % IV SOLN: 500.0000 mL | INTRAVENOUS | Status: DC

## 2016-01-04 NOTE — Op Note (Signed)
[facility] Patient Name: Kelsey Bruce Procedure Date: 01/04/2016 11:49 AM MRN: [id number] Endoscopist: Docia Chuck. Henrene Pastor , MD Age: 65 Referring MD:  Date of Birth: [birth year] Gender: Female Account #: [id number] Procedure:                Colonoscopy, with cold snare polypectomy x 1 Indications:              Screening for colorectal malignant neoplasm.                            Previous exam 2004 with hyperplastic polyp in cecum Medicines:                Monitored Anesthesia Care Procedure:                Pre-Anesthesia Assessment:                           - Prior to the procedure, a History and Physical                            was performed, and patient medications and                            allergies were reviewed. The patient's tolerance of                            previous anesthesia was also reviewed. The risks                            and benefits of the procedure and the sedation                            options and risks were discussed with the patient.                            All questions were answered, and informed consent                            was obtained. Prior Anticoagulants: The patient has                            taken no previous anticoagulant or antiplatelet                            agents. ASA Grade Assessment: II - A patient with                            mild systemic disease. After reviewing the risks                            and benefits, the patient was deemed in                            satisfactory condition to undergo the procedure.  After obtaining informed consent, the colonoscope                            was passed under direct vision. Throughout the                            procedure, the patient's blood pressure, pulse, and                            oxygen saturations were monitored continuously. The                            Model [id number]-[id number]) scope was introduced                       through the anus and advanced to the the cecum,                            identified by appendiceal orifice and ileocecal                            valve. The ileocecal valve, appendiceal orifice,                            and rectum were photographed. The quality of the                            bowel preparation was excellent. The colonoscopy                            was performed without difficulty. The patient                            tolerated the procedure well. The bowel preparation                            used was SUPREP. Scope In: 12:07:13 PM Scope Out: 12:23:22 PM Scope Withdrawal Time: 0 hours 13 minutes 7 seconds  Total Procedure Duration: 0 hours 16 minutes 9 seconds  Findings:                 A 3 mm polyp was found in the ascending colon. The                            polyp was sessile. The polyp was removed with a                            cold snare. Resection and retrieval were complete.                           The exam was otherwise without abnormality on                            direct and retroflexion views. Complications:  No immediate complications. Estimated blood loss:                            None. Estimated Blood Loss:     Estimated blood loss: none. Impression:               - One 3 mm polyp in the ascending colon, removed                            with a cold snare. Resected and retrieved.                           - The examination was otherwise normal on direct                            and retroflexion views. Recommendation:           - Repeat colonoscopy in 10 years for surveillance.                           - Patient has a contact number available for                            emergencies. The signs and symptoms of potential                            delayed complications were discussed with the                            patient. Return to normal activities tomorrow.                            Written  discharge instructions were provided to the                            patient.                           - Resume previous diet.                           - Continue present medications.                           - Await pathology results. Docia Chuck. Henrene Pastor, MD 01/04/2016 12:31:06 PM This report has been signed electronically.

## 2016-01-04 NOTE — Patient Instructions (Signed)
1 polyp found and removed.   Repeat colonoscopy in 10 years.     YOU HAD AN ENDOSCOPIC PROCEDURE TODAY AT [facility]:   Refer to the procedure report that was given to you for any specific questions about what was found during the examination.  If the procedure report does not answer your questions, please call your gastroenterologist to clarify.  If you requested that your care partner not be given the details of your procedure findings, then the procedure report has been included in a sealed envelope for you to review at your convenience later.  YOU SHOULD EXPECT: Some feelings of bloating in the abdomen. Passage of more gas than usual.  Walking can help get rid of the air that was put into your GI tract during the procedure and reduce the bloating. If you had a lower endoscopy (such as a colonoscopy or flexible sigmoidoscopy) you may notice spotting of blood in your stool or on the toilet paper. If you underwent a bowel prep for your procedure, you may not have a normal bowel movement for a few days.  Please Note:  You might notice some irritation and congestion in your nose or some drainage.  This is from the oxygen used during your procedure.  There is no need for concern and it should clear up in a day or so.  SYMPTOMS TO REPORT IMMEDIATELY:   Following lower endoscopy (colonoscopy or flexible sigmoidoscopy):  Excessive amounts of blood in the stool  Significant tenderness or worsening of abdominal pains  Swelling of the abdomen that is new, acute  Fever of 100F or higher   For urgent or emergent issues, a gastroenterologist can be reached at any hour by calling [phone].   DIET:  We do recommend a small meal at first, but then you may proceed to your regular diet.  Drink plenty of fluids but you should avoid alcoholic beverages for 24 hours.  ACTIVITY:  You should plan to take it easy for the rest of today and you should NOT DRIVE or use heavy machinery until  tomorrow (because of the sedation medicines used during the test).    FOLLOW UP: Our staff will call the number listed on your records the next business day following your procedure to check on you and address any questions or concerns that you may have regarding the information given to you following your procedure. If we do not reach you, we will leave a message.  However, if you are feeling well and you are not experiencing any problems, there is no need to return our call.  We will assume that you have returned to your regular daily activities without incident.  If any biopsies were taken you will be contacted by phone or by letter within the next 1-3 weeks.  Please call us at [phone] if you have not heard about the biopsies in 3 weeks.    SIGNATURES/CONFIDENTIALITY: You and/or your care partner have signed paperwork which will be entered into your electronic medical record.  These signatures attest to the fact that that the information above on your After Visit Summary has been reviewed and is understood.  Full responsibility of the confidentiality of this discharge information lies with you and/or your care-partner.

## 2016-01-04 NOTE — Progress Notes (Signed)
Called to room to assist during endoscopic procedure.  Patient ID and intended procedure confirmed with present staff. Received instructions for my participation in the procedure from the performing physician.  

## 2016-01-04 NOTE — Progress Notes (Signed)
To PACU, vss patent aw report to rn 

## 2016-01-05 ENCOUNTER — Telehealth: Payer: Self-pay

## 2016-01-05 NOTE — Telephone Encounter (Signed)
  Follow up Call-  Call back number 01/04/2016  Post procedure Call Back phone  # [phone]  Permission to leave phone message Yes  Some recent data might be hidden     Patient questions:  Do you have a fever, pain , or abdominal swelling? No. Pain Score  0 *  Have you tolerated food without any problems? Yes.    Have you been able to return to your normal activities? Yes.    Do you have any questions about your discharge instructions: Diet   No. Medications  No. Follow up visit  No.  Do you have questions or concerns about your Care? No.  Actions: * If pain score is 4 or above: No action needed, pain <4.

## 2016-01-07 ENCOUNTER — Encounter: Payer: Self-pay | Admitting: Family Medicine

## 2016-01-10 ENCOUNTER — Encounter: Payer: Self-pay | Admitting: Internal Medicine

## 2016-01-16 ENCOUNTER — Encounter: Payer: Self-pay | Admitting: Family Medicine

## 2016-03-04 ENCOUNTER — Ambulatory Visit: Payer: BLUE CROSS/BLUE SHIELD | Attending: Gynecologic Oncology | Admitting: Gynecologic Oncology

## 2016-03-04 ENCOUNTER — Encounter: Payer: Self-pay | Admitting: Gynecologic Oncology

## 2016-03-04 VITALS — BP 123/79 | HR 83 | Temp 97.4°F | Resp 16 | Wt 144.0 lb

## 2016-03-04 DIAGNOSIS — Z08 Encounter for follow-up examination after completed treatment for malignant neoplasm: Secondary | ICD-10-CM | POA: Diagnosis not present

## 2016-03-04 DIAGNOSIS — Z90710 Acquired absence of both cervix and uterus: Secondary | ICD-10-CM | POA: Diagnosis not present

## 2016-03-04 DIAGNOSIS — Z96652 Presence of left artificial knee joint: Secondary | ICD-10-CM | POA: Insufficient documentation

## 2016-03-04 DIAGNOSIS — E785 Hyperlipidemia, unspecified: Secondary | ICD-10-CM | POA: Diagnosis not present

## 2016-03-04 DIAGNOSIS — Z923 Personal history of irradiation: Secondary | ICD-10-CM | POA: Insufficient documentation

## 2016-03-04 DIAGNOSIS — C541 Malignant neoplasm of endometrium: Secondary | ICD-10-CM | POA: Diagnosis present

## 2016-03-04 DIAGNOSIS — M1990 Unspecified osteoarthritis, unspecified site: Secondary | ICD-10-CM | POA: Insufficient documentation

## 2016-03-04 DIAGNOSIS — G809 Cerebral palsy, unspecified: Secondary | ICD-10-CM | POA: Insufficient documentation

## 2016-03-04 DIAGNOSIS — Z90722 Acquired absence of ovaries, bilateral: Secondary | ICD-10-CM | POA: Insufficient documentation

## 2016-03-04 DIAGNOSIS — Z8542 Personal history of malignant neoplasm of other parts of uterus: Secondary | ICD-10-CM | POA: Insufficient documentation

## 2016-03-04 DIAGNOSIS — Z9079 Acquired absence of other genital organ(s): Secondary | ICD-10-CM | POA: Diagnosis not present

## 2016-03-04 NOTE — Patient Instructions (Signed)
Call our office after your appt with Dr. Sondra Come to schedule and appt with Dr. Denman George

## 2016-03-04 NOTE — Progress Notes (Signed)
GYN ONC FOLLOWUP  Assessment:    65 y.o. 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   S/p robotic hysterectomy, BSO, lymphadenectomy on 10/05/13. + LVSI, 80% myometrial invasion, negative pelvic washings and negative lymph nodes. She has completed her vaginal cuff brachytherapy with her last treatment on January 13, 2014. She's doing very well post radiation with the normal expected side effects.   NED on today's exam.  Plan: As she is >2 years from completing treatment, we will transition to 6 monthly surveillance exams 1) Follow-up with Dr. Sondra Come in 6 months (May, 2018) and return to see me in 12 months (November 2018).  HPI:  Kelsey Bruce is a 65 y.o. year old G2P2002 initially seen in consultation on 6/26 for endometrial cancer referred by Dr Hulan Fray.  She then underwent a robotic assisted total hysterectomy, BSO and pelvic lymphadenectomy on [id number] without complications.  Her postoperative course was uncomplicated.  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 She was recommended to receive adjuvant vaginal brachytherapy to reduce the risk for local recurrence based on her high/intermediate risk factors.   Pathology report 10/05/13:  1. Uterus +/- tubes/ovaries, neoplastic, with cervix - INVASIVE ENDOMETRIOID CARCINOMA, FIGO GRADE II, INVADING INTO OUTER HALF OF THE MYOMETRIUM WITH ANGIOLYMPHATIC INVASION PRESENT. - CERVIX: BENIGN SQUAMOUS MUCOSA AND ENDOCERVICAL MUCOSA, NO DYSPLASIA OR MALIGNANCY. - BILATERAL OVARIES AND FALLOPIAN TUBES: BENIGN OVARIAN TISSUE AND FALLOPIAN TUBAL TISSUE, NO EVIDENCE OF MALIGNANCY. - PLEASE SEE ONCOLOGY TEMPLATE FOR DETAILS. 2. Lymph nodes, regional resection, left pelvic - SIX LYMPH NODES, NEGATIVE FOR METASTATIC CARCINOMA (0/6). 3. Lymph nodes, regional resection, right pelvic - THIRTEEN  LYMPH NODES, NEGATIVE FOR METASTATIC CARCINOMA (0/13). Microscopic Comment 1. ONCOLOGY TABLE-UTERUS, CARCINOMA Specimen: Uterus, cervix, bilateral ovaries and fallopian tubes. Procedure: Total hysterectomy and bilateral salpingo-oophorectomy. Lymph node sampling performed: Yes Specimen integrity: Intact. Maximum tumor size: 4.0 cm, gross measurement. Histologic type: Invasive endometrioid carcinoma. Grade: FIGO Grade II Myometrial invasion: 1.2 cm where myometrium is 1.5 cm in thickness Cervical stromal involvement: No Extent of involvement of other organs: No Lymph - vascular invasion: Present. Peritoneal washings: Negative [id number] Lymph nodes: # examined 19 ; # positive 0 Pelvic lymph nodes: 0 involved of 19 lymph nodes. Para-aortic lymph nodes:   Interval Hx:  She underwent left TKA in 2016 and is doing much better from this. She denies symptoms concerning for recurrent endometrial cancer. Last colonoscopy was October, 2017 and showed benign polyps.  Review of systems: Constitutional:  She has no weight gain or weight loss. She has no fever or chills. Eyes: No blurred vision sores. Cardiovascular: No chest pain or edema. Respiratory:  No shortness of breath Gastrointestinal: She has normal bowel movements without diarrhea or constipation. She denies any nausea or vomiting. She denies blood in her stool or heart burn. Genitourinary:  She denies pelvic pain, pelvic pressure or changes in her urinary function. She has no hematuria, dysuria, or incontinence. She has no irregular vaginal bleeding or vaginal discharge Musculoskeletal: no left knee pain Skin:  Resolved vulvar itching Psychiatric:  She denies depression or anxiety. Hematologic/Lymphatic:   No easy bruising or bleeding  Allergies  Allergen Reactions  . Aspirin Nausea Only    High dose only   Outpatient Encounter Prescriptions as of 03/04/2016  Medication Sig Note  . beta carotene w/minerals (OCUVITE) tablet  Take 1 tablet by mouth  every morning. Reported on 06/01/2015   . CALCIUM PO Take 1 tablet by mouth daily.   . cetirizine (ZYRTEC) 10 MG tablet Take 10 mg by mouth daily as needed for allergies. Reported on 06/01/2015   . Cholecalciferol (VITAMIN D PO) Take 1 tablet by mouth daily.   Marland Kitchen docusate sodium (COLACE) 100 MG capsule Take 100 mg by mouth daily as needed for mild constipation. Reported on 06/01/2015   . meloxicam (MOBIC) 15 MG tablet Take 15 mg by mouth daily. Reported on 06/01/2015 10/06/2015: Taking every other day - hks  . polyethylene glycol (MIRALAX / GLYCOLAX) packet Take 17 g by mouth daily as needed for mild constipation. Reported on 06/01/2015   . Probiotic Product (PROBIOTIC PO) Take 1 tablet by mouth every morning. Reported on 06/01/2015   . aspirin EC 81 MG tablet Take 81 mg by mouth daily.    Facility-Administered Encounter Medications as of 03/04/2016  Medication  . 0.9 %  sodium chloride infusion   Past Medical History:  Diagnosis Date  . Allergy   . Borderline hyperlipidemia    Framingham CV risk 2017= 6%.  . Cataract   . Cerebral palsy ([place])   . Colon polyp, hyperplastic 2004; 12/2015   Recall 12/2025  . Difficulty sleeping   . Endometrial adenocarcinoma ([place]) 09/2013   Dr. Denman George: pt to get vaginal brachytherapy via rad onc  . Morton's neuroma 2012   Dr. Roseanne Reno did injections in the past (left foot)  . Osteoarthritis    LB, HIPs, knees (L knee end stage), hands  . Radiation 12/16/13, 12/22/13, 12/30/13, 01/06/14, 01/13/14   proximal vagina 30 gray  . Seasonal allergic rhinitis    Oncology History   65 year old woman with stage IB grade 2 endometriod endometrial adenocarcinoma with high/intermediate risk factors. Recommended to receive adjuvant vaginal brachytherapy after definitive staging to reduce the risk for recurrence.     Endometrial cancer, grade I ([place])   09/06/2013 Initial Diagnosis    Endometrial cancer, grade I      10/05/2013 Surgery    robotic assisted  total hysterctomy, BSO, pelvic lymphadenectomy      12/16/2013 - 01/13/2014 Radiation Therapy    Vaginal cuff brachytherapy      Past Surgical History:  Procedure Laterality Date  . COLONOSCOPY  09/2002; 12/2015   2004 hyperplastic; recall sent 2009 but pt apparently didn't respond.  12/2015: one sessile serrated polyp w/out cytologic atypia: recall 10 yrs per GI.  Marland Kitchen DEXA  08/2007; 11/14/15   2009 T score -1.2.  2017 T score -1.6.  . DILATION AND CURETTAGE OF UTERUS    . LEG SURGERY Left 01/2014   "cleaned out cartilage"  . ROBOTIC ASSISTED TOTAL HYSTERECTOMY WITH BILATERAL SALPINGO OOPHERECTOMY Bilateral 10/05/2013   Procedure: ROBOTIC ASSISTED TOTAL HYSTERECTOMY WITH BILATERAL SALPINGO OOPHORECTOMY WITH LYMPH NODE DISECTION;  Surgeon: Everitt Amber, MD;  Location: [facility];  Service: Gynecology;  Laterality: Bilateral;  . TONSILLECTOMY    . TOTAL KNEE ARTHROPLASTY Left 12/07/2014   Procedure: TOTAL KNEE ARTHROPLASTY;  Surgeon: Latanya Maudlin, MD;  Location: [facility];  Service: Orthopedics;  Laterality: Left;  . TUBAL LIGATION     Family History  Problem Relation Age of Onset  . Heart disease Father   . Hypertension Father   . Cancer Father     lung  . Arthritis Father   . Alcohol abuse Father   . Heart disease Mother   . Cancer Mother     lung  . Deep  vein thrombosis Mother   . Alcohol abuse Mother   . Heart disease Brother   . Hypertension Brother   . Heart disease Brother   . Hypertension Brother   . Heart disease Brother   . Hypertension Brother   . Stomach cancer Maternal Grandfather   . Colon cancer Neg Hx   . Esophageal cancer Neg Hx   . Rectal cancer Neg Hx    Social History   Social History  . Marital status: Divorced    Spouse name: N/A  . Number of children: 2  . Years of education: N/A   Occupational History  . retired    Social History Main Topics  . Smoking status: Never Smoker  . Smokeless tobacco: Never Used  . Alcohol use None     Comment:  occasional  . Drug use: Unknown  . Sexual activity: Not Asked   Other Topics Concern  . None   Social History Narrative   Divorced.  Two children.   Orig from [place], [place].   HS education.   Retired.   No tobacco, occ alcohol, no drugs.   Has 3 brothers and they all smoked, as did her parents.    Physical Exam: Blood pressure 123/79, pulse 83, temperature 97.4 F (36.3 C), temperature source Oral, resp. rate 16, weight 144 lb (65.3 kg), SpO2 100 %. General: Well dressed, well nourished in no apparent distress.    Lungs:  Clear to auscultation bilaterally.  No wheezes.  Cardiovascular:  Regular rate and rhythm.   Abdomen:  Soft, nontender, nondistended.  No palpable masses.  No hepatosplenomegaly.  No ascites. Normal bowel sounds.  No hernias.  Incisions are well healed.  Genitourinary: Normal EGBUS  Vaginal cuff intact. No visible lesions, atrophic.  No bleeding or discharge.  No masses or nodularity, rectal confirms. Petechial changes at vaginal cuff.  Extremities: No cyanosis, clubbing or edema.   Psychiatric: Mood and affect are appropriate.  Donaciano Eva, MD   CC: Dr Anitra Lauth, Dr Sondra Come, Dr Hulan Fray

## 2016-03-05 ENCOUNTER — Encounter: Payer: Self-pay | Admitting: Family Medicine

## 2016-04-07 ENCOUNTER — Encounter: Payer: Self-pay | Admitting: Family Medicine

## 2016-05-07 ENCOUNTER — Ambulatory Visit (INDEPENDENT_AMBULATORY_CARE_PROVIDER_SITE_OTHER): Payer: BLUE CROSS/BLUE SHIELD | Admitting: Podiatry

## 2016-05-07 ENCOUNTER — Encounter: Payer: Self-pay | Admitting: Podiatry

## 2016-05-07 VITALS — BP 160/99 | HR 71 | Ht 60.0 in | Wt 140.0 lb

## 2016-05-07 DIAGNOSIS — Q828 Other specified congenital malformations of skin: Secondary | ICD-10-CM | POA: Diagnosis not present

## 2016-05-07 NOTE — Progress Notes (Signed)
   Subjective:    Patient ID: Kelsey Bruce, female    DOB: 1950-10-19, 66 y.o.   MRN: [id number]  HPI   This patient presents with her 9 month history of a painful plantar skin lesion on the left foot which is gradually increasing discomfort with standing walking over time. Patient has apply topical callus remover with slight reduction of pain.   Review of Systems  All other systems reviewed and are negative.      Objective:   Physical Exam  Orientated 3  Vascular: DP pulses 2/4 bilaterally PT pulses 2/4 bilaterally X immediate bilaterally  Neurological: Sensation to 10 g monofilament wire intact 5/5 bilaterally Vibratory sensation reactive bilaterally Ankle reflex reactive bilaterally  Dermatological: No open skin lesions bilaterally Atrophic skin bilaterally Nucleated plantar keratoses sub-third left MPJ Mild atrophy of plantar MPJ fat-pad  Musculoskeletal: HAV bilaterally Manual motor testing dorsi flexion, plantar flexion, inversion, eversion 5/5 bilaterally      Assessment & Plan:   Assessment: Satisfactory neurovascular status Porokeratosis 1 plantar left  Plan: Informed patient that the skin lesion was a nucleated light corn, porokeratosis and debrided the lesion and apply salinocaine. Patient informed that the lesion will recur and return as needed  Reappoint at patient's request

## 2016-05-07 NOTE — Patient Instructions (Signed)
Day of foot screen demonstrated adequate circulation feeling in your feet. There is a nucleated core and light growth on the bottom of your left foot, porokeratosis. The cause is unknown. Today I debrided this area and put a small amount of mild callus remover on this area. This cordlike growth will recur and return as needed

## 2016-05-22 ENCOUNTER — Telehealth: Payer: Self-pay | Admitting: Family Medicine

## 2016-05-22 NOTE — Telephone Encounter (Signed)
Since she has already been exposed for several days, there is no indication for using tamiflu as prophylaxis--it won't help prevent anything at this point.

## 2016-05-22 NOTE — Telephone Encounter (Signed)
Patient would like to know if there are any precautions she needs to take after being exposed to flu?  Thank you,  -LL

## 2016-05-22 NOTE — Telephone Encounter (Signed)
SW pt she stated that she left for a trip on Thursday (05/16/16) with some friends and they came back on Monday (05/20/16). She stated that two of the lady's started getting sick Monday evening and one of the tested positive for the flu. She wants to know if some thing should be called in for her, if so please send it to [facility]. Please advise. Thanks.

## 2016-05-22 NOTE — Telephone Encounter (Signed)
Pt advised and voiced understanding.   

## 2016-06-06 ENCOUNTER — Ambulatory Visit: Payer: Self-pay | Admitting: Radiation Oncology

## 2016-07-31 ENCOUNTER — Encounter: Payer: Self-pay | Admitting: Family Medicine

## 2016-07-31 ENCOUNTER — Ambulatory Visit (HOSPITAL_BASED_OUTPATIENT_CLINIC_OR_DEPARTMENT_OTHER)
Admission: RE | Admit: 2016-07-31 | Discharge: 2016-07-31 | Disposition: A | Discharge status: Home / Self Care | Payer: BLUE CROSS/BLUE SHIELD | Source: Ambulatory Visit | Attending: Family Medicine | Admitting: Family Medicine

## 2016-07-31 ENCOUNTER — Ambulatory Visit (INDEPENDENT_AMBULATORY_CARE_PROVIDER_SITE_OTHER): Payer: BLUE CROSS/BLUE SHIELD | Admitting: Family Medicine

## 2016-07-31 VITALS — BP 120/84 | HR 73 | Temp 98.0°F | Resp 16 | Ht 60.0 in | Wt 148.5 lb

## 2016-07-31 DIAGNOSIS — M5136 Other intervertebral disc degeneration, lumbar region: Secondary | ICD-10-CM | POA: Diagnosis not present

## 2016-07-31 DIAGNOSIS — M545 Low back pain, unspecified: Secondary | ICD-10-CM

## 2016-07-31 DIAGNOSIS — M4316 Spondylolisthesis, lumbar region: Secondary | ICD-10-CM | POA: Diagnosis not present

## 2016-07-31 MED ORDER — CYCLOBENZAPRINE HCL 10 MG PO TABS: 10.0000 mg | ORAL_TABLET | Freq: Three times a day (TID) | ORAL | 0 refills | Status: DC | PRN

## 2016-07-31 MED ORDER — TRAMADOL HCL 50 MG PO TABS: ORAL_TABLET | ORAL | 0 refills | Status: DC

## 2016-07-31 NOTE — Patient Instructions (Signed)
Low Back Sprain Rehab  Ask your health care provider which exercises are safe for you. Do exercises exactly as told by your health care provider and adjust them as directed. It is normal to feel mild stretching, pulling, tightness, or discomfort as you do these exercises, but you should stop right away if you feel sudden pain or your pain gets worse. Do not begin these exercises until told by your health care provider.  Stretching and range of motion exercises  These exercises warm up your muscles and joints and improve the movement and flexibility of your back. These exercises also help to relieve pain, numbness, and tingling.  Exercise A: Lumbar rotation     1. Lie on your back on a firm surface and bend your knees.  2. Straighten your arms out to your sides so each arm forms an "L" shape with a side of your body (a 90 degree angle).  3. Slowly move both of your knees to one side of your body until you feel a stretch in your lower back. Try not to let your shoulders move off of the floor.  4. Hold for __________ seconds.  5. Tense your abdominal muscles and slowly move your knees back to the starting position.  6. Repeat this exercise on the other side of your body.  Repeat __________ times. Complete this exercise __________ times a day.  Exercise B: Prone extension on elbows     1. Lie on your abdomen on a firm surface.  2. Prop yourself up on your elbows.  3. Use your arms to help lift your chest up until you feel a gentle stretch in your abdomen and your lower back.  ? This will place some of your body weight on your elbows. If this is uncomfortable, try stacking pillows under your chest.  ? Your hips should stay down, against the surface that you are lying on. Keep your hip and back muscles relaxed.  4. Hold for __________ seconds.  5. Slowly relax your upper body and return to the starting position.  Repeat __________ times. Complete this exercise __________ times a day.  Strengthening exercises  These  exercises build strength and endurance in your back. Endurance is the ability to use your muscles for a long time, even after they get tired.  Exercise C: Pelvic tilt   1. Lie on your back on a firm surface. Bend your knees and keep your feet flat.  2. Tense your abdominal muscles. Tip your pelvis up toward the ceiling and flatten your lower back into the floor.  ? To help with this exercise, you may place a small towel under your lower back and try to push your back into the towel.  3. Hold for __________ seconds.  4. Let your muscles relax completely before you repeat this exercise.  Repeat __________ times. Complete this exercise __________ times a day.  Exercise D: Alternating arm and leg raises     1. Get on your hands and knees on a firm surface. If you are on a hard floor, you may want to use padding to cushion your knees, such as an exercise mat.  2. Line up your arms and legs. Your hands should be below your shoulders, and your knees should be below your hips.  3. Lift your left leg behind you. At the same time, raise your right arm and straighten it in front of you.  ? Do not lift your leg higher than your hip.  ? Do   not lift your arm higher than your shoulder.  ? Keep your abdominal and back muscles tight.  ? Keep your hips facing the ground.  ? Do not arch your back.  ? Keep your balance carefully, and do not hold your breath.  4. Hold for __________ seconds.  5. Slowly return to the starting position and repeat with your right leg and your left arm.  Repeat __________ times. Complete this exercise __________ times a day.  Exercise E: Abdominal set with straight leg raise     1. Lie on your back on a firm surface.  2. Bend one of your knees and keep your other leg straight.  3. Tense your abdominal muscles and lift your straight leg up, 4-6 inches (10-15 cm) off the ground.  4. Keep your abdominal muscles tight and hold for __________ seconds.  ? Do not hold your breath.  ? Do not arch your back. Keep it  flat against the ground.  5. Keep your abdominal muscles tense as you slowly lower your leg back to the starting position.  6. Repeat with your other leg.  Repeat __________ times. Complete this exercise __________ times a day.  Posture and body mechanics     Body mechanics refers to the movements and positions of your body while you do your daily activities. Posture is part of body mechanics. Good posture and healthy body mechanics can help to relieve stress in your body's tissues and joints. Good posture means that your spine is in its natural S-curve position (your spine is neutral), your shoulders are pulled back slightly, and your head is not tipped forward. The following are general guidelines for applying improved posture and body mechanics to your everyday activities.  Standing     · When standing, keep your spine neutral and your feet about hip-width apart. Keep a slight bend in your knees. Your ears, shoulders, and hips should line up.  · When you do a task in which you stand in one place for a long time, place one foot up on a stable object that is 2-4 inches (5-10 cm) high, such as a footstool. This helps keep your spine neutral.  Sitting     · When sitting, keep your spine neutral and keep your feet flat on the floor. Use a footrest, if necessary, and keep your thighs parallel to the floor. Avoid rounding your shoulders, and avoid tilting your head forward.  · When working at a desk or a computer, keep your desk at a height where your hands are slightly lower than your elbows. Slide your chair under your desk so you are close enough to maintain good posture.  · When working at a computer, place your monitor at a height where you are looking straight ahead and you do not have to tilt your head forward or downward to look at the screen.  Resting     · When lying down and resting, avoid positions that are most painful for you.  · If you have pain with activities such as sitting, bending, stooping, or  squatting (flexion-based activities), lie in a position in which your body does not bend very much. For example, avoid curling up on your side with your arms and knees near your chest (fetal position).  · If you have pain with activities such as standing for a long time or reaching with your arms (extension-based activities), lie with your spine in a neutral position and bend your knees slightly. Try the following   positions:  · Lying on your side with a pillow between your knees.  · Lying on your back with a pillow under your knees.  Lifting     · When lifting objects, keep your feet at least shoulder-width apart and tighten your abdominal muscles.  · Bend your knees and hips and keep your spine neutral. It is important to lift using the strength of your legs, not your back. Do not lock your knees straight out.  · Always ask for help to lift heavy or awkward objects.  This information is not intended to replace advice given to you by your health care provider. Make sure you discuss any questions you have with your health care provider.  Document Released: 03/11/2005 Document Revised: 11/16/2015 Document Reviewed: 12/21/2014  Elsevier Interactive Patient Education © 2017 [employer]

## 2016-07-31 NOTE — Progress Notes (Signed)
OFFICE VISIT  07/31/2016   CC:  Chief Complaint  Patient presents with  . Back Pain    x 5-6 days, fell 9 days ago   HPI:    Patient is a 66 y.o. Caucasian female who presents for back pain. Has hx of osteoarthritis of low back.  No hx of back surgery.  No hx of back x-ray in EMR.  Onset of pain in back after falling 9 days ago.  She lost balance and fell forward and hit door frame and knocked her back and she landed on her buttocks.   Back started hurting in R LB--extending with less pain across entire low back--onset 2-3 d after the fall. Hurts in R upper gluteal region as well.  Moving around makes it worse initially, then it eases off after being up a minute.  No radiation down leg, no paresthesias. Taking meloxicam qd last few days.  Has taken tylenol as well.  Muscle relaxer lately but this is expired. None of these meds have helped much.  Past Medical History:  Diagnosis Date  . Allergy   . Borderline hyperlipidemia    Framingham CV risk 2017= 6%.  . Cataract   . Cerebral palsy ([place])   . Colon polyp, hyperplastic 2004; 12/2015   Recall 12/2025  . Difficulty sleeping   . Endometrial adenocarcinoma ([place]) 09/2013   Dr. Denman George: pt got vaginal brachytherapy via rad onc.  No sign of dz recurrence as of 02/2016 Gyn Onc f/u.  Next f/u with them is 6 mo.  . Morton's neuroma 2012   Dr. Roseanne Reno did injections in the past (left foot)  . Osteoarthritis    LB, HIPs, knees (L knee end stage), hands  . Radiation 12/16/13, 12/22/13, 12/30/13, 01/06/14, 01/13/14   proximal vagina 30 gray  . Seasonal allergic rhinitis     Past Surgical History:  Procedure Laterality Date  . COLONOSCOPY  09/2002; 12/2015   2004 hyperplastic; recall sent 2009 but pt apparently didn't respond.  12/2015: one sessile serrated polyp w/out cytologic atypia: recall 10 yrs per GI.  Marland Kitchen DEXA  08/2007; 11/14/15   2009 T score -1.2.  2017 T score -1.6.  . DILATION AND CURETTAGE OF UTERUS    . LEG SURGERY Left 01/2014    "cleaned out cartilage"  . ROBOTIC ASSISTED TOTAL HYSTERECTOMY WITH BILATERAL SALPINGO OOPHERECTOMY Bilateral 10/05/2013   Procedure: ROBOTIC ASSISTED TOTAL HYSTERECTOMY WITH BILATERAL SALPINGO OOPHORECTOMY WITH LYMPH NODE DISECTION;  Surgeon: Everitt Amber, MD;  Location: [facility];  Service: Gynecology;  Laterality: Bilateral;  . TONSILLECTOMY    . TOTAL KNEE ARTHROPLASTY Left 12/07/2014   Procedure: TOTAL KNEE ARTHROPLASTY;  Surgeon: Latanya Maudlin, MD;  Location: [facility];  Service: Orthopedics;  Laterality: Left;  . TUBAL LIGATION      Outpatient Medications Prior to Visit  Medication Sig Dispense Refill  . aspirin EC 81 MG tablet Take 81 mg by mouth daily.    . beta carotene w/minerals (OCUVITE) tablet Take 1 tablet by mouth every morning. Reported on 06/01/2015    . CALCIUM PO Take 1 tablet by mouth daily.    . cetirizine (ZYRTEC) 10 MG tablet Take 10 mg by mouth daily as needed for allergies. Reported on 06/01/2015    . Cholecalciferol (VITAMIN D PO) Take 1 tablet by mouth daily.    Marland Kitchen docusate sodium (COLACE) 100 MG capsule Take 100 mg by mouth daily as needed for mild constipation. Reported on 06/01/2015    . meloxicam (MOBIC) 15 MG tablet  Take 15 mg by mouth daily. Reported on 06/01/2015  2  . polyethylene glycol (MIRALAX / GLYCOLAX) packet Take 17 g by mouth daily as needed for mild constipation. Reported on 06/01/2015    . Probiotic Product (PROBIOTIC PO) Take 1 tablet by mouth every morning. Reported on 06/01/2015    . 0.9 %  sodium chloride infusion      No facility-administered medications prior to visit.     Allergies  Allergen Reactions  . Aspirin Nausea Only    High dose only  . Seasonal Ic [Cholestatin]     ROS As per HPI  PE: Blood pressure 120/84, pulse 73, temperature 98 F (36.7 C), temperature source Oral, resp. rate 16, height 5' (1.524 m), weight 148 lb 8 oz (67.4 kg), SpO2 95 %. Gen: Alert, well appearing.  Patient is oriented to person, place, time, and  situation. AFFECT: pleasant, lucid thought and speech. ROM of L spine: intact but painful in all ranges. TTP over lower lumbar lateral soft tissues as well as upper aspect of glut on R.  Also some TTP over R iliac crest. Sitting SLR neg bilat.  LE strength 5/5 prox/dist bilat.  Patellar DTRs 2+ bilat.  Achilles 0 bilat.   LABS:    Chemistry      Component Value Date/Time   NA 143 10/06/2015 1428   K 4.5 10/06/2015 1428   CL 105 10/06/2015 1428   CO2 28 10/06/2015 1428   BUN 17 10/06/2015 1428   CREATININE 0.77 10/06/2015 1428   CREATININE 0.96 09/10/2013 1159      Component Value Date/Time   CALCIUM 9.6 10/06/2015 1428   ALKPHOS 65 10/06/2015 1428   AST 15 10/06/2015 1428   ALT 14 10/06/2015 1428   BILITOT 0.6 10/06/2015 1428      IMPRESSION AND PLAN:  Acute R sided low back/upper glut contusion/strain. Due to trauma initiating the pain, will check L spine plain films. Heat + LB stretching discussed, recommended. Continue meloxicam qod. Tramadol 50mg , 1-2 tid prn, #30, no RF. Flexeril 10mg , 1 tab tid prn, #30, no RF. Therapeutic expectations and side effect profile of medication discussed today.  Patient's questions answered.  An After Visit Summary was printed and given to the patient.  FOLLOW UP: Return if symptoms worsen or fail to improve.  Call or return if not significantly improved in 2 weeks.  Signed:  Crissie Sickles, MD           07/31/2016

## 2016-08-01 ENCOUNTER — Encounter: Payer: Self-pay | Admitting: *Deleted

## 2016-08-08 ENCOUNTER — Encounter: Payer: Self-pay | Admitting: Radiation Oncology

## 2016-08-08 ENCOUNTER — Ambulatory Visit
Admission: RE | Admit: 2016-08-08 | Discharge: 2016-08-08 | Disposition: A | Discharge status: Home / Self Care | Payer: BLUE CROSS/BLUE SHIELD | Source: Ambulatory Visit | Attending: Radiation Oncology | Admitting: Radiation Oncology

## 2016-08-08 VITALS — BP 136/83 | HR 76 | Temp 98.3°F | Ht 60.0 in | Wt 148.6 lb

## 2016-08-08 DIAGNOSIS — C541 Malignant neoplasm of endometrium: Secondary | ICD-10-CM | POA: Insufficient documentation

## 2016-08-08 DIAGNOSIS — K5900 Constipation, unspecified: Secondary | ICD-10-CM | POA: Diagnosis not present

## 2016-08-08 DIAGNOSIS — Z7982 Long term (current) use of aspirin: Secondary | ICD-10-CM | POA: Diagnosis not present

## 2016-08-08 DIAGNOSIS — Z9889 Other specified postprocedural states: Secondary | ICD-10-CM | POA: Insufficient documentation

## 2016-08-08 NOTE — Progress Notes (Signed)
Radiation Oncology         [phone] ________________________________  Name: Kelsey Bruce MRN: [id number]  Date: 08/08/2016  DOB: 12-11-1950   Follow-Up Visit Note  CC: McGowen, Adrian Blackwater, MD  Everitt Amber, MD    ICD-9-CM ICD-10-CM   1. Endometrial cancer, grade I (HCC) 182.0 C54.1     Diagnosis: Stage IB Grade II endometrioid adenocarcinoma.   Interval Since Last Radiation: 2 years 7 months  September 24, September 30th, October 8, October 15, January 13, 2014: Proximal vagina 30 gray in 5 fractions (6 gray per fraction) vaginal cuff brachytherapy.  Narrative:  The patient returns for a routine follow up. Patient reports falling on 07/22/16 and hurt her lower back. She reports an x-ray was performed and results were normal. She denies difficulties with bladder. She notes occasional constipation that she manages with Miralax and Colace. She denies vaginal bleeding or discharge. She reports a good appetite and energy level. She has not been using her dilator.  ALLERGIES:  is allergic to aspirin and seasonal ic [cholestatin].  Meds: Current Outpatient Prescriptions  Medication Sig Dispense Refill  . aspirin EC 81 MG tablet Take 81 mg by mouth daily.    . beta carotene w/minerals (OCUVITE) tablet Take 1 tablet by mouth every morning. Reported on 06/01/2015    . CALCIUM PO Take 1 tablet by mouth daily.    . cetirizine (ZYRTEC) 10 MG tablet Take 10 mg by mouth daily as needed for allergies. Reported on 06/01/2015    . Cholecalciferol (VITAMIN D PO) Take 1 tablet by mouth daily.    . cyclobenzaprine (FLEXERIL) 10 MG tablet Take 1 tablet (10 mg total) by mouth 3 (three) times daily as needed for muscle spasms. 30 tablet 0  . docusate sodium (COLACE) 100 MG capsule Take 100 mg by mouth daily as needed for mild constipation. Reported on 06/01/2015    . meloxicam (MOBIC) 15 MG tablet Take 15 mg by mouth daily. Reported on 06/01/2015  2  . polyethylene glycol (MIRALAX / GLYCOLAX) packet Take 17  g by mouth daily as needed for mild constipation. Reported on 06/01/2015    . Probiotic Product (PROBIOTIC PO) Take 1 tablet by mouth every morning. Reported on 06/01/2015    . traMADol (ULTRAM) 50 MG tablet 1-2 tabs po tid prn back pain 30 tablet 0   No current facility-administered medications for this encounter.     Physical Findings  height is 5' (1.524 m) and weight is 148 lb 9.6 oz (67.4 kg). Her oral temperature is 98.3 F (36.8 C). Her blood pressure is 136/83 and her pulse is 76. Her oxygen saturation is 98%.   Lungs are clear to auscultation bilaterally. Heart has regular rate and rhythm. No palpable cervical, supraclavicular, or axillary adenopathy. Abdomen soft, non-tender, normal bowel sounds On pelvic examination the external genitalia were unremarkable. A speculum exam was performed. There are no mucosal lesions noted in the vaginal vault. On bimanual and rectovaginal examination there were no pelvic masses appreciated.   Lab Findings: Lab Results  Component Value Date   WBC 6.8 10/06/2015   HGB 14.6 10/06/2015   HCT 43.3 10/06/2015   MCV 87.8 10/06/2015   PLT 320.0 10/06/2015    Radiographic Findings: Dg Lumbar Spine 2-3 Views  Result Date: 07/31/2016 CLINICAL DATA:  Mid to right low back pain for 9 days EXAM: LUMBAR SPINE - 2-3 VIEW COMPARISON:  09/15/2013 CT FINDINGS: Anterolisthesis of L4 on L5, 5 mm. This is increased since  prior CT. This is related to degenerative facet disease. Disc space narrowing also present at L4-5. Diffuse degenerative facet disease throughout the lumbar spine. No fracture or malalignment. SI joints are symmetric and unremarkable. IMPRESSION: Degenerative disc disease at L4-5. Diffuse degenerative facet disease. Grade 1 anterolisthesis of L4 on L5. No acute findings. Electronically Signed   By: Rolm Baptise M.D.   On: 07/31/2016 11:58    Impression: Stage IB Grade II endometrioid adenocarcinoma. No evidence of disease recurrence on clinical exam.     Plan: Radiation oncology follow-up in 1 year . The patient will follow-up with Dr. Denman George in November. ____________________________________   Blair Promise, PhD, MD  This document serves as a record of services personally performed by Gery Pray, MD. It was created on his behalf by Bethann Humble, a trained medical scribe. The creation of this record is based on the scribe's personal observations and the provider's statements to them. This document has been checked and approved by the attending provider.

## 2016-08-08 NOTE — Progress Notes (Signed)
Kelsey Bruce is here for follow up.  She reports having a fall on 07/22/16 and hurt her lower back.  She said an x ray was done and came back normal.  She denies having any trouble with urination.  She reports having occasional constipation with her last bm 2 days ago.  She does take Miralax and colace.  She denies having any vaginal bleeding/discharge.  She reports having a good appetite and energy level.  She has not been using a dilator.  BP 136/83 (BP Location: Left Arm, Patient Position: Sitting)   Pulse 76   Temp 98.3 F (36.8 C) (Oral)   Ht 5' (1.524 m)   Wt 148 lb 9.6 oz (67.4 kg)   SpO2 98%   BMI 29.02 kg/m    Wt Readings from Last 3 Encounters:  08/08/16 148 lb 9.6 oz (67.4 kg)  07/31/16 148 lb 8 oz (67.4 kg)  05/07/16 140 lb (63.5 kg)

## 2016-09-12 ENCOUNTER — Ambulatory Visit (INDEPENDENT_AMBULATORY_CARE_PROVIDER_SITE_OTHER): Payer: Medicare Other | Admitting: Family Medicine

## 2016-09-12 ENCOUNTER — Encounter: Payer: Self-pay | Admitting: Family Medicine

## 2016-09-12 VITALS — BP 120/73 | HR 80 | Temp 97.9°F | Resp 16 | Ht 60.0 in | Wt 148.0 lb

## 2016-09-12 DIAGNOSIS — R21 Rash and other nonspecific skin eruption: Secondary | ICD-10-CM

## 2016-09-12 MED ORDER — PREDNISONE 20 MG PO TABS: ORAL_TABLET | ORAL | 0 refills | Status: DC

## 2016-09-12 MED ORDER — FLUTICASONE PROPIONATE 0.05 % EX CREA: TOPICAL_CREAM | Freq: Two times a day (BID) | CUTANEOUS | 1 refills | Status: DC

## 2016-09-12 NOTE — Progress Notes (Signed)
OFFICE VISIT  09/12/2016   CC:  Chief Complaint  Patient presents with  . Rash    x 1 week, on neck/chest and thighs   HPI:    Patient is a 66 y.o. Caucasian female who presents for rash. Onset 5 d/a, big patches of redness on both inner thighs, very itchy, some on left side of neck and sparsely on upper chest and arms.  No groin involvement.  No hands or feet or scalp involvement. No known contact irritants/allergens exposure. No new foods.  No new medications. Took benadryl po last night. Applying hydrocortisone otc ointment since yesterday.  Has never had a rash like this before.  No fevers, no malaise, no ST.  No tongue, lips, throat, or eyes swelling.  No wheezing or SOB.  Past Medical History:  Diagnosis Date  . Allergy   . Borderline hyperlipidemia    [place] CV risk 2017= 6%.  . Cataract   . Cerebral palsy ([place])   . Colon polyp, hyperplastic 2004; 12/2015   Recall 12/2025  . Difficulty sleeping   . Endometrial adenocarcinoma ([place]) 09/2013   Dr. Denman George: pt got vaginal brachytherapy via rad onc.  No sign of dz recurrence as of 02/2016 Gyn Onc f/u.  Next f/u with them is 6 mo.  . Morton's neuroma 2012   Dr. Roseanne Reno did injections in the past (left foot)  . Osteoarthritis    LB, HIPs, knees (L knee end stage), hands  . Radiation 12/16/13, 12/22/13, 12/30/13, 01/06/14, 01/13/14   proximal vagina 30 gray  . Seasonal allergic rhinitis     Past Surgical History:  Procedure Laterality Date  . COLONOSCOPY  09/2002; 12/2015   2004 hyperplastic; recall sent 2009 but pt apparently didn't respond.  12/2015: one sessile serrated polyp w/out cytologic atypia: recall 10 yrs per GI.  Marland Kitchen DEXA  08/2007; 11/14/15   2009 T score -1.2.  2017 T score -1.6.  . DILATION AND CURETTAGE OF UTERUS    . LEG SURGERY Left 01/2014   "cleaned out cartilage"  . ROBOTIC ASSISTED TOTAL HYSTERECTOMY WITH BILATERAL SALPINGO OOPHERECTOMY Bilateral 10/05/2013   Procedure: ROBOTIC ASSISTED TOTAL  HYSTERECTOMY WITH BILATERAL SALPINGO OOPHORECTOMY WITH LYMPH NODE DISECTION;  Surgeon: Everitt Amber, MD;  Location: [facility];  Service: Gynecology;  Laterality: Bilateral;  . TONSILLECTOMY    . TOTAL KNEE ARTHROPLASTY Left 12/07/2014   Procedure: TOTAL KNEE ARTHROPLASTY;  Surgeon: Latanya Maudlin, MD;  Location: [facility];  Service: Orthopedics;  Laterality: Left;  . TUBAL LIGATION      Outpatient Medications Prior to Visit  Medication Sig Dispense Refill  . aspirin EC 81 MG tablet Take 81 mg by mouth daily.    . beta carotene w/minerals (OCUVITE) tablet Take 1 tablet by mouth every morning. Reported on 06/01/2015    . CALCIUM PO Take 1 tablet by mouth daily.    . cetirizine (ZYRTEC) 10 MG tablet Take 10 mg by mouth daily as needed for allergies. Reported on 06/01/2015    . Cholecalciferol (VITAMIN D PO) Take 1 tablet by mouth daily.    . cyclobenzaprine (FLEXERIL) 10 MG tablet Take 1 tablet (10 mg total) by mouth 3 (three) times daily as needed for muscle spasms. 30 tablet 0  . docusate sodium (COLACE) 100 MG capsule Take 100 mg by mouth daily as needed for mild constipation. Reported on 06/01/2015    . meloxicam (MOBIC) 15 MG tablet Take 15 mg by mouth daily. Reported on 06/01/2015  2  . polyethylene glycol (MIRALAX /  GLYCOLAX) packet Take 17 g by mouth daily as needed for mild constipation. Reported on 06/01/2015    . Probiotic Product (PROBIOTIC PO) Take 1 tablet by mouth every morning. Reported on 06/01/2015    . traMADol (ULTRAM) 50 MG tablet 1-2 tabs po tid prn back pain (Patient not taking: Reported on 09/12/2016) 30 tablet 0   No facility-administered medications prior to visit.     Allergies  Allergen Reactions  . Aspirin Nausea Only    High dose only  . Seasonal Ic [Cholestatin]     ROS As per HPI  PE: Blood pressure 120/73, pulse 80, temperature 97.9 F (36.6 C), temperature source Oral, resp. rate 16, height 5' (1.524 m), weight 148 lb (67.1 kg), SpO2 96 %.  Pt examined with Sharen Hones, CMA, as chaperone.  Gen: Alert, well appearing.  Patient is oriented to person, place, time, and situation. AFFECT: pleasant, lucid thought and speech. WGY:KZLD: no injection, icteris, swelling, or exudate.  EOMI, PERRLA. Mouth: lips without lesion/swelling.  Oral mucosa pink and moist. Oropharynx without erythema, exudate, or swelling.  CV: RRR, no m/r/g.   LUNGS: CTA bilat, nonlabored resps, good aeration in all lung fields. Skin: inner thighs with large coalesced splotch of erythematous maculopapular rash---not urticarial.  Symmetric on thighs. R side of neck/under R mandible with deep pink maculopapular rash---more of a sparse distribution of small splotches--not coalesced.  No signif lesions on arms or legs or back.  Face is normal.  LABS:    Chemistry      Component Value Date/Time   NA 143 10/06/2015 1428   K 4.5 10/06/2015 1428   CL 105 10/06/2015 1428   CO2 28 10/06/2015 1428   BUN 17 10/06/2015 1428   CREATININE 0.77 10/06/2015 1428   CREATININE 0.96 09/10/2013 1159      Component Value Date/Time   CALCIUM 9.6 10/06/2015 1428   ALKPHOS 65 10/06/2015 1428   AST 15 10/06/2015 1428   ALT 14 10/06/2015 1428   BILITOT 0.6 10/06/2015 1428       IMPRESSION AND PLAN:  Rash: nonspecific dermatitis.  No contact irritant/allergen identified. No new ingestions/meds. Question viral exanthem vs idiopathic dermatitis vs idiopathic urticaria variant. Instructions:  Apply the fluticasone cream to the affected areas twice a day. If not significantly improving with use of cream in 2d, OR if rash is spreading over the next 1-2 days, then start the prednisone I prescribed. Antihistamine prn. If rash persists, will do punch biopsy.  An After Visit Summary was printed and given to the patient.  FOLLOW UP: Return if symptoms worsen or fail to improve.  Signed:  Crissie Sickles, MD           09/12/2016

## 2016-09-12 NOTE — Patient Instructions (Signed)
Apply the fluticasone cream to the affected areas twice a day. If not significantly improving in 2d, OR if rash is spreading over the next 1-2 days, then start the prednisone I prescribed.

## 2016-11-19 DIAGNOSIS — H2513 Age-related nuclear cataract, bilateral: Secondary | ICD-10-CM | POA: Diagnosis not present

## 2016-11-19 DIAGNOSIS — H52223 Regular astigmatism, bilateral: Secondary | ICD-10-CM | POA: Diagnosis not present

## 2016-11-19 DIAGNOSIS — H524 Presbyopia: Secondary | ICD-10-CM | POA: Diagnosis not present

## 2016-11-19 DIAGNOSIS — H5202 Hypermetropia, left eye: Secondary | ICD-10-CM | POA: Diagnosis not present

## 2016-11-19 DIAGNOSIS — H35363 Drusen (degenerative) of macula, bilateral: Secondary | ICD-10-CM | POA: Diagnosis not present

## 2016-11-28 ENCOUNTER — Ambulatory Visit (INDEPENDENT_AMBULATORY_CARE_PROVIDER_SITE_OTHER): Payer: Medicare Other | Admitting: Family Medicine

## 2016-11-28 ENCOUNTER — Encounter: Payer: Self-pay | Admitting: Family Medicine

## 2016-11-28 VITALS — BP 121/81 | HR 77 | Temp 98.1°F | Resp 16 | Ht 60.0 in | Wt 148.5 lb

## 2016-11-28 DIAGNOSIS — R05 Cough: Secondary | ICD-10-CM

## 2016-11-28 DIAGNOSIS — K219 Gastro-esophageal reflux disease without esophagitis: Secondary | ICD-10-CM

## 2016-11-28 DIAGNOSIS — R059 Cough, unspecified: Secondary | ICD-10-CM

## 2016-11-28 DIAGNOSIS — J301 Allergic rhinitis due to pollen: Secondary | ICD-10-CM

## 2016-11-28 DIAGNOSIS — N3001 Acute cystitis with hematuria: Secondary | ICD-10-CM

## 2016-11-28 LAB — POC URINALSYSI DIPSTICK (AUTOMATED)
BILIRUBIN UA: NEGATIVE
Glucose, UA: NEGATIVE
Ketones, UA: NEGATIVE
NITRITE UA: NEGATIVE
PH UA: 8 (ref 5.0–8.0)
Protein, UA: NEGATIVE
Spec Grav, UA: 1.015 (ref 1.010–1.025)
Urobilinogen, UA: 0.2 E.U./dL

## 2016-11-28 MED ORDER — SULFAMETHOXAZOLE-TRIMETHOPRIM 800-160 MG PO TABS: 1.0000 | ORAL_TABLET | Freq: Two times a day (BID) | ORAL | 0 refills | Status: DC

## 2016-11-28 MED ORDER — PANTOPRAZOLE SODIUM 40 MG PO TBEC: 40.0000 mg | DELAYED_RELEASE_TABLET | Freq: Every day | ORAL | 3 refills | Status: DC

## 2016-11-28 NOTE — Patient Instructions (Signed)
Restart your daily zyrtec (generic) over the counter (10 mg).

## 2016-11-28 NOTE — Progress Notes (Signed)
OFFICE VISIT  11/28/2016   CC:  Chief Complaint  Patient presents with  . Urinary Tract Infection  . Chest Congestion   HPI:    Patient is a 66 y.o. Caucasian female who presents for "UT" and respiratory symptoms.  Urinary: feels urgency, nocturia, dysuria.  Has had sx's 1 week now.  Took AZO otc and these helped.  Stopped these 4 days ago.  No fever, abd pain, nausea.  Resp: night time chest tightness, a little dry cough, some HAs.  No ST.  Nighttime PND.  Taking otc allergy med this week.  No SOB or wheezing.  No fever.  She does feel some GERD/dyspepsia lately--not on any H2 blocker or PPI. 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  Past Medical History:  Diagnosis Date  . Allergy   . Borderline hyperlipidemia    [place] CV risk 2017= 6%.  . Cataract   . Cerebral palsy ([place])   . Colon polyp, hyperplastic 2004; 12/2015   Recall 12/2025  . Difficulty sleeping   . Endometrial adenocarcinoma ([place]) 09/2013   Dr. Denman George: pt got vaginal brachytherapy via rad onc.  No sign of dz recurrence as of 02/2016 Gyn Onc f/u.  Next f/u with them is 6 mo.  . Morton's neuroma 2012   Dr. Roseanne Reno did injections in the past (left foot)  . Osteoarthritis    LB, HIPs, knees (L knee end stage), hands  . Radiation 12/16/13, 12/22/13, 12/30/13, 01/06/14, 01/13/14   proximal vagina 30 gray  . Seasonal allergic rhinitis     Past Surgical History:  Procedure Laterality Date  . COLONOSCOPY  09/2002; 12/2015   2004 hyperplastic; recall sent 2009 but pt apparently didn't respond.  12/2015: one sessile serrated polyp w/out cytologic atypia: recall 10 yrs per GI.  Marland Kitchen DEXA  08/2007; 11/14/15   2009 T score -1.2.  2017 T score -1.6.  . DILATION AND CURETTAGE OF UTERUS    . LEG SURGERY Left 01/2014   "cleaned out cartilage"  . ROBOTIC ASSISTED TOTAL HYSTERECTOMY WITH BILATERAL SALPINGO OOPHERECTOMY Bilateral 10/05/2013   Procedure: ROBOTIC ASSISTED TOTAL HYSTERECTOMY WITH BILATERAL  SALPINGO OOPHORECTOMY WITH LYMPH NODE DISECTION;  Surgeon: Everitt Amber, MD;  Location: [facility];  Service: Gynecology;  Laterality: Bilateral;  . TONSILLECTOMY    . TOTAL KNEE ARTHROPLASTY Left 12/07/2014   Procedure: TOTAL KNEE ARTHROPLASTY;  Surgeon: Latanya Maudlin, MD;  Location: [facility];  Service: Orthopedics;  Laterality: Left;  . TUBAL LIGATION      Outpatient Medications Prior to Visit  Medication Sig Dispense Refill  . aspirin EC 81 MG tablet Take 81 mg by mouth daily.    . beta carotene w/minerals (OCUVITE) tablet Take 1 tablet by mouth every morning. Reported on 06/01/2015    . CALCIUM PO Take 1 tablet by mouth daily.    . cetirizine (ZYRTEC) 10 MG tablet Take 10 mg by mouth daily as needed for allergies. Reported on 06/01/2015    . Cholecalciferol (VITAMIN D PO) Take 1 tablet by mouth daily.    . cyclobenzaprine (FLEXERIL) 10 MG tablet Take 1 tablet (10 mg total) by mouth 3 (three) times daily as needed for muscle spasms. 30 tablet 0  . docusate sodium (COLACE) 100 MG capsule Take 100 mg by mouth daily as needed for mild constipation. Reported on 06/01/2015    . fluticasone (CUTIVATE) 0.05 % cream Apply topically 2 (two) times daily. 60 g 1  . meloxicam (MOBIC) 15 MG tablet Take 15 mg  by mouth daily. Reported on 06/01/2015  2  . polyethylene glycol (MIRALAX / GLYCOLAX) packet Take 17 g by mouth daily as needed for mild constipation. Reported on 06/01/2015    . Probiotic Product (PROBIOTIC PO) Take 1 tablet by mouth every morning. Reported on 06/01/2015    . predniSONE (DELTASONE) 20 MG tablet 2 tabs po qd x 5d, then 1 tab po qd x 5d, then 1/2 tab po qd x 6d (Patient not taking: Reported on 11/28/2016) 18 tablet 0   No facility-administered medications prior to visit.     Allergies  Allergen Reactions  . Aspirin Nausea Only    High dose only  . Seasonal Ic [Cholestatin]     ROS As per HPI  PE: Blood pressure 121/81, pulse 77, temperature 98.1 F (36.7 C), temperature source Oral, resp.  rate 16, height 5' (1.524 m), weight 148 lb 8 oz (67.4 kg), SpO2 95 %. Gen: Alert, well appearing.  Patient is oriented to person, place, time, and situation. AFFECT: pleasant, lucid thought and speech. ENT: Ears: EACs clear, normal epithelium.  TMs with good light reflex and landmarks bilaterally.  Eyes: no injection, icteris, swelling, or exudate.  EOMI, PERRLA. Nose: no drainage or turbinate edema/swelling.  No injection or focal lesion.  Mouth: lips without lesion/swelling.  Oral mucosa pink and moist.  Dentition intact and without obvious caries or gingival swelling.  Oropharynx without erythema, exudate, or swelling.  CV: RRR, no m/r/g.   LUNGS: CTA bilat, nonlabored resps, good aeration in all lung fields.   LABS:    Chemistry      Component Value Date/Time   NA 143 10/06/2015 1428   K 4.5 10/06/2015 1428   CL 105 10/06/2015 1428   CO2 28 10/06/2015 1428   BUN 17 10/06/2015 1428   CREATININE 0.77 10/06/2015 1428   CREATININE 0.96 09/10/2013 1159      Component Value Date/Time   CALCIUM 9.6 10/06/2015 1428   ALKPHOS 65 10/06/2015 1428   AST 15 10/06/2015 1428   ALT 14 10/06/2015 1428   BILITOT 0.6 10/06/2015 1428      CC UA today: trace intact blood, small LEU, o/w normal.  IMPRESSION AND PLAN:  1) UTI with microhematuria: bactrim DS, 1 bid x 5d. Send urine for c/s.  2) Upper airway cough: likely combo of allergic rhinitis with PND + uncontrolled GERD. Start pantoprazole 40mg  qd. Restart daily zyrtec 10mg  qd (otc).  An After Visit Summary was printed and given to the patient.  FOLLOW UP: Return if symptoms worsen or fail to improve.  Signed:  Crissie Sickles, MD           11/28/2016

## 2016-12-01 LAB — URINE CULTURE
MICRO NUMBER:: 80983064
SPECIMEN QUALITY:: ADEQUATE

## 2017-01-27 ENCOUNTER — Encounter (HOSPITAL_COMMUNITY): Payer: Self-pay

## 2017-01-31 DIAGNOSIS — M545 Low back pain: Secondary | ICD-10-CM | POA: Diagnosis not present

## 2017-01-31 DIAGNOSIS — M25571 Pain in right ankle and joints of right foot: Secondary | ICD-10-CM | POA: Diagnosis not present

## 2017-02-21 ENCOUNTER — Other Ambulatory Visit: Payer: Self-pay

## 2017-04-10 ENCOUNTER — Ambulatory Visit: Payer: Medicare Other | Admitting: Family Medicine

## 2017-04-24 ENCOUNTER — Encounter: Payer: Medicare Other | Admitting: Family Medicine

## 2017-04-25 ENCOUNTER — Telehealth: Payer: Self-pay | Admitting: Family Medicine

## 2017-04-25 MED ORDER — MELOXICAM 15 MG PO TABS: 15.0000 mg | ORAL_TABLET | Freq: Every day | ORAL | 5 refills | Status: DC

## 2017-04-25 NOTE — Telephone Encounter (Signed)
OK, meloxicam eRx'd.

## 2017-04-25 NOTE — Telephone Encounter (Signed)
RF request for meloxicam LOV: 11/28/16 Next ov: 05/08/17 Last written: 05/26/14 #30 w/ 5RF  Please advise. Thanks.

## 2017-04-25 NOTE — Telephone Encounter (Signed)
Copied from [place] [id number]. Topic: Quick Communication - Rx Refill/Question >> Apr 25, 2017  3:53 PM Malena Catholic I, NT wrote: Medication Meloxicam 15 Mg    Has the patient contacted their pharmacy yes    (Agent: If no, request that the patient contact the pharmacy for the refill ye   Preferred Pharmacy (with phone number or street name [facility] @ [facility] [phone]   Agent: Please be advised that RX refills may take up to 3 business days. We ask that you follow-up with your pharmacy.

## 2017-04-28 NOTE — Telephone Encounter (Signed)
Left message on home vm stating that Rx has been sent to her pharmacy.

## 2017-05-08 ENCOUNTER — Encounter: Payer: Self-pay | Admitting: Family Medicine

## 2017-05-08 ENCOUNTER — Ambulatory Visit (INDEPENDENT_AMBULATORY_CARE_PROVIDER_SITE_OTHER): Payer: Medicare Other | Admitting: Family Medicine

## 2017-05-08 VITALS — BP 144/76 | HR 75 | Temp 97.8°F | Resp 16 | Ht 60.0 in | Wt 146.0 lb

## 2017-05-08 DIAGNOSIS — Z1239 Encounter for other screening for malignant neoplasm of breast: Secondary | ICD-10-CM

## 2017-05-08 DIAGNOSIS — Z1231 Encounter for screening mammogram for malignant neoplasm of breast: Secondary | ICD-10-CM | POA: Diagnosis not present

## 2017-05-08 DIAGNOSIS — M1712 Unilateral primary osteoarthritis, left knee: Secondary | ICD-10-CM

## 2017-05-08 DIAGNOSIS — Z1159 Encounter for screening for other viral diseases: Secondary | ICD-10-CM

## 2017-05-08 DIAGNOSIS — K219 Gastro-esophageal reflux disease without esophagitis: Secondary | ICD-10-CM | POA: Diagnosis not present

## 2017-05-08 DIAGNOSIS — R350 Frequency of micturition: Secondary | ICD-10-CM

## 2017-05-08 DIAGNOSIS — Z9189 Other specified personal risk factors, not elsewhere classified: Secondary | ICD-10-CM

## 2017-05-08 DIAGNOSIS — Z8542 Personal history of malignant neoplasm of other parts of uterus: Secondary | ICD-10-CM | POA: Diagnosis not present

## 2017-05-08 DIAGNOSIS — G808 Other cerebral palsy: Secondary | ICD-10-CM

## 2017-05-08 DIAGNOSIS — Z13220 Encounter for screening for lipoid disorders: Secondary | ICD-10-CM | POA: Diagnosis not present

## 2017-05-08 DIAGNOSIS — Z0000 Encounter for general adult medical examination without abnormal findings: Secondary | ICD-10-CM | POA: Diagnosis not present

## 2017-05-08 DIAGNOSIS — Z131 Encounter for screening for diabetes mellitus: Secondary | ICD-10-CM | POA: Diagnosis not present

## 2017-05-08 LAB — CBC WITH DIFFERENTIAL/PLATELET
BASOS PCT: 1.1 % (ref 0.0–3.0)
Basophils Absolute: 0.1 10*3/uL (ref 0.0–0.1)
EOS ABS: 0.2 10*3/uL (ref 0.0–0.7)
EOS PCT: 2.8 % (ref 0.0–5.0)
HEMATOCRIT: 43.1 % (ref 36.0–46.0)
HEMOGLOBIN: 14.6 g/dL (ref 12.0–15.0)
LYMPHS PCT: 32.9 % (ref 12.0–46.0)
Lymphs Abs: 1.8 10*3/uL (ref 0.7–4.0)
MCHC: 34 g/dL (ref 30.0–36.0)
MCV: 90.1 fl (ref 78.0–100.0)
Monocytes Absolute: 0.5 10*3/uL (ref 0.1–1.0)
Monocytes Relative: 9.7 % (ref 3.0–12.0)
Neutro Abs: 2.9 10*3/uL (ref 1.4–7.7)
Neutrophils Relative %: 53.5 % (ref 43.0–77.0)
Platelets: 302 10*3/uL (ref 150.0–400.0)
RBC: 4.78 Mil/uL (ref 3.87–5.11)
RDW: 13 % (ref 11.5–15.5)
WBC: 5.3 10*3/uL (ref 4.0–10.5)

## 2017-05-08 LAB — LIPID PANEL
CHOL/HDL RATIO: 4
CHOLESTEROL: 196 mg/dL (ref 0–200)
HDL: 47.7 mg/dL (ref >39.00)
LDL Cholesterol: 128 mg/dL — ABNORMAL HIGH (ref 0–99)
NonHDL: 148.55
TRIGLYCERIDES: 101 mg/dL (ref 0.0–149.0)
VLDL: 20.2 mg/dL (ref 0.0–40.0)

## 2017-05-08 LAB — POCT URINALYSIS DIPSTICK
BILIRUBIN UA: NEGATIVE
Glucose, UA: NEGATIVE
Ketones, UA: NEGATIVE
LEUKOCYTES UA: NEGATIVE
NITRITE UA: NEGATIVE
PH UA: 7 (ref 5.0–8.0)
PROTEIN UA: NEGATIVE
RBC UA: NEGATIVE
Spec Grav, UA: 1.015 (ref 1.010–1.025)
UROBILINOGEN UA: NEGATIVE U/dL — AB

## 2017-05-08 LAB — COMPREHENSIVE METABOLIC PANEL
ALBUMIN: 4.2 g/dL (ref 3.5–5.2)
ALT: 13 U/L (ref 0–35)
AST: 14 U/L (ref 0–37)
Alkaline Phosphatase: 58 U/L (ref 39–117)
BUN: 17 mg/dL (ref 6–23)
CALCIUM: 9.8 mg/dL (ref 8.4–10.5)
CHLORIDE: 103 meq/L (ref 96–112)
CO2: 31 meq/L (ref 19–32)
CREATININE: 0.8 mg/dL (ref 0.40–1.20)
GFR: 76.35 mL/min (ref >60.00)
Glucose, Bld: 102 mg/dL — ABNORMAL HIGH (ref 70–99)
Potassium: 4.2 mEq/L (ref 3.5–5.1)
Sodium: 139 mEq/L (ref 135–145)
Total Bilirubin: 0.7 mg/dL (ref 0.2–1.2)
Total Protein: 6.8 g/dL (ref 6.0–8.3)

## 2017-05-08 MED ORDER — ZOSTER VAC RECOMB ADJUVANTED 50 MCG/0.5ML IM SUSR: 0.5000 mL | Freq: Once | INTRAMUSCULAR | 1 refills | Status: AC

## 2017-05-08 NOTE — Progress Notes (Signed)
Office Note 05/08/2017  CC:  Chief Complaint  Patient presents with  . Annual Exam    HPI:  Kelsey Bruce is a 67 y.o. White female who is here for f/u cerebral palsy, GERD, borderline hyperlipidemia, hx of hyperplastic colon polyp, hx of endometrial carcinoma, and osteoarthritis of left knee (s/p TKA).  Doing well: knee has not been bothering her much until she recently tripped at home and fell onto it.  It is improving gradually.   Says the PPI she is taking daily is helping very well. Has had her final f/u with her GYN ONC MD and is considered cured of her endometrial cancer.  She has one last f/u with her rad oncologist.  Reports a couple days of urinary frequency and some intermittent dysuria. No abd pain, nausea, back/flank pain, or fevers.   Has been taking otc azo but none yesterday--says urine not orange now.  Sx's seem better today.  Eyes: last exam 3 yrs ago. Dental preventatives UTD. Exercise: none. Diet: not working on anything in particular at this time.  Past Medical History:  Diagnosis Date  . Allergy   . Borderline hyperlipidemia    [place] CV risk 2017= 6%.  . Cataract   . Cerebral palsy ([place])   . Colon polyp, hyperplastic 2004; 12/2015   Recall 12/2025  . Difficulty sleeping   . Endometrial adenocarcinoma ([place]) 09/2013   Dr. Denman George: pt got vaginal brachytherapy via rad onc.  No sign of dz recurrence as of 02/2016 Gyn Onc f/u.  Next f/u with them is 6 mo.  . Morton's neuroma 2012   Dr. Roseanne Reno did injections in the past (left foot)  . Osteoarthritis    LB, HIPs, knees (L knee end stage), hands  . Radiation 12/16/13, 12/22/13, 12/30/13, 01/06/14, 01/13/14   proximal vagina 30 gray  . Seasonal allergic rhinitis     Past Surgical History:  Procedure Laterality Date  . COLONOSCOPY  09/2002; 12/2015   2004 hyperplastic; recall sent 2009 but pt apparently didn't respond.  12/2015: one sessile serrated polyp w/out cytologic atypia: recall 10 yrs per GI.   Marland Kitchen DEXA  08/2007; 11/14/15   2009 T score -1.2.  2017 T score -1.6.  . DILATION AND CURETTAGE OF UTERUS    . LEG SURGERY Left 01/2014   "cleaned out cartilage"  . ROBOTIC ASSISTED TOTAL HYSTERECTOMY WITH BILATERAL SALPINGO OOPHERECTOMY Bilateral 10/05/2013   Procedure: ROBOTIC ASSISTED TOTAL HYSTERECTOMY WITH BILATERAL SALPINGO OOPHORECTOMY WITH LYMPH NODE DISECTION;  Surgeon: Everitt Amber, MD;  Location: [facility];  Service: Gynecology;  Laterality: Bilateral;  . TONSILLECTOMY    . TOTAL KNEE ARTHROPLASTY Left 12/07/2014   Procedure: TOTAL KNEE ARTHROPLASTY;  Surgeon: Latanya Maudlin, MD;  Location: [facility];  Service: Orthopedics;  Laterality: Left;  . TUBAL LIGATION      Family History  Problem Relation Age of Onset  . Heart disease Father   . Hypertension Father   . Cancer Father        lung  . Arthritis Father   . Alcohol abuse Father   . Heart disease Mother   . Cancer Mother        lung  . Deep vein thrombosis Mother   . Alcohol abuse Mother   . Heart disease Brother   . Hypertension Brother   . Heart disease Brother   . Hypertension Brother   . Heart disease Brother   . Hypertension Brother   . Stomach cancer Maternal Grandfather   . Colon cancer  Neg Hx   . Esophageal cancer Neg Hx   . Rectal cancer Neg Hx     Social History   Socioeconomic History  . Marital status: Divorced    Spouse name: Not on file  . Number of children: 2  . Years of education: Not on file  . Highest education level: Not on file  Social Needs  . Financial resource strain: Not on file  . Food insecurity - worry: Not on file  . Food insecurity - inability: Not on file  . Transportation needs - medical: Not on file  . Transportation needs - non-medical: Not on file  Occupational History  . Occupation: retired  Tobacco Use  . Smoking status: Never Smoker  . Smokeless tobacco: Never Used  Substance and Sexual Activity  . Alcohol use: No    Frequency: Never    Comment: occasional  . Drug use:  No  . Sexual activity: Not on file  Other Topics Concern  . Not on file  Social History Narrative   Divorced.  Two children.   Orig from [place], [place].   HS education.   Retired.   No tobacco, occ alcohol, no drugs.   Has 3 brothers and they all smoked, as did her parents.   Not taking bactrim listed below. Outpatient Medications Prior to Visit  Medication Sig Dispense Refill  . aspirin EC 81 MG tablet Take 81 mg by mouth daily.    . beta carotene w/minerals (OCUVITE) tablet Take 1 tablet by mouth every morning. Reported on 06/01/2015    . CALCIUM PO Take 1 tablet by mouth daily.    . cetirizine (ZYRTEC) 10 MG tablet Take 10 mg by mouth daily as needed for allergies. Reported on 06/01/2015    . Cholecalciferol (VITAMIN D PO) Take 1 tablet by mouth daily.    . cyclobenzaprine (FLEXERIL) 10 MG tablet Take 1 tablet (10 mg total) by mouth 3 (three) times daily as needed for muscle spasms. 30 tablet 0  . docusate sodium (COLACE) 100 MG capsule Take 100 mg by mouth daily as needed for mild constipation. Reported on 06/01/2015    . fluticasone (CUTIVATE) 0.05 % cream Apply topically 2 (two) times daily. 60 g 1  . meloxicam (MOBIC) 15 MG tablet Take 1 tablet (15 mg total) by mouth daily. 30 tablet 5  . pantoprazole (PROTONIX) 40 MG tablet Take 1 tablet (40 mg total) by mouth daily. 30 tablet 3  . polyethylene glycol (MIRALAX / GLYCOLAX) packet Take 17 g by mouth daily as needed for mild constipation. Reported on 06/01/2015    . Probiotic Product (PROBIOTIC PO) Take 1 tablet by mouth every morning. Reported on 06/01/2015    . sulfamethoxazole-trimethoprim (BACTRIM DS,SEPTRA DS) 800-160 MG tablet Take 1 tablet by mouth 2 (two) times daily. 10 tablet 0   No facility-administered medications prior to visit.     Allergies  Allergen Reactions  . Aspirin Nausea Only    High dose only  . Seasonal Ic [Cholestatin]     ROS Review of Systems  Constitutional: Negative for fatigue and fever.  HENT:  Negative for congestion and sore throat.   Eyes: Negative for visual disturbance.  Respiratory: Negative for cough.   Cardiovascular: Negative for chest pain.  Gastrointestinal: Negative for abdominal pain and nausea.  Genitourinary: Positive for dysuria.       See hpi  Musculoskeletal: Negative for back pain and joint swelling.  Skin: Negative for rash.  Neurological: Negative for weakness and headaches.  Hematological: Negative for adenopathy.    PE; Initial bp today was 148/93. Blood pressure (!) 144/76, pulse 75, temperature 97.8 F (36.6 C), temperature source Oral, resp. rate 16, height 5' (1.524 m), weight 146 lb (66.2 kg), SpO2 95 %.  Pt examined with Etheleen Sia, nurse, as chaperone.  Gen: Alert, well appearing.  Patient is oriented to person, place, time, and situation. AFFECT: pleasant, lucid thought and speech. ENT: Ears: EACs clear, normal epithelium.  TMs with good light reflex and landmarks bilaterally.  Eyes: no injection, icteris, swelling, or exudate.  EOMI, PERRLA. Nose: no drainage or turbinate edema/swelling.  No injection or focal lesion.  Mouth: lips without lesion/swelling.  Oral mucosa pink and moist.  Dentition intact and without obvious caries or gingival swelling.  Oropharynx without erythema, exudate, or swelling.  Neck: supple/nontender.  No LAD, mass, or TM.  Carotid pulses 2+ bilaterally, without bruits. CV: RRR, no m/r/g.   LUNGS: CTA bilat, nonlabored resps, good aeration in all lung fields. ABD: soft, NT, ND, BS normal.  No hepatospenomegaly or mass.  No bruits. EXT: no clubbing, cyanosis, or edema.  Musculoskeletal: no joint swelling, erythema, warmth, or tenderness.  ROM of all joints intact. Skin - no sores or suspicious lesions or rashes or color changes Neuro: CN 2-12 intact bilaterally, strength 5/5 in proximal and distal upper extremities and lower extremities bilaterally.  No tremor.  No disdiadochokinesis.  No ataxia.  Upper extremity and  lower extremity DTRs symmetric.  No pronator drift.   Pertinent labs:  Lab Results  Component Value Date   TSH 3.06 10/06/2015   Lab Results  Component Value Date   WBC 6.8 10/06/2015   HGB 14.6 10/06/2015   HCT 43.3 10/06/2015   MCV 87.8 10/06/2015   PLT 320.0 10/06/2015   Lab Results  Component Value Date   CREATININE 0.77 10/06/2015   BUN 17 10/06/2015   NA 143 10/06/2015   K 4.5 10/06/2015   CL 105 10/06/2015   CO2 28 10/06/2015   Lab Results  Component Value Date   ALT 14 10/06/2015   AST 15 10/06/2015   ALKPHOS 65 10/06/2015   BILITOT 0.6 10/06/2015   Lab Results  Component Value Date   CHOL 207 (H) 10/06/2015   Lab Results  Component Value Date   HDL 44.80 10/06/2015   Lab Results  Component Value Date   LDLCALC 138 (H) 10/06/2015   Lab Results  Component Value Date   TRIG 119.0 10/06/2015   Lab Results  Component Value Date   CHOLHDL 5 10/06/2015   UA dipstick today: NORMAL  ASSESSMENT AND PLAN:    1) Borderline hyperlipidemia: recheck FLP today. Will do diabetes screening today as well.  2) GERD: The current medical regimen is effective;  continue present plan and medications.  3) Cerebral palsy: she is essentially asymptomatic from this dx now.  4) Urinary frequency/dysuria--seems to have resolved today. UA normal today.  Send urine for c/s for completeness-sake but no abx at this time.  5) Preventative health care:  Vaccines:  Flu vaccine--declines.   Prevnar 13--declines this today.   Shingrix discussed---rx sent to pharmacy. Labs: fasting cBC, CMET, FLP, Hep C screening. Cervical ca screening: hx of endomet ca--has had TAH/BSO.  Will review guidelines and make sure she does not required cerv ca screening any longer.   Breast ca screening: most recent mammo 10/2015.  Will order for GI, breast center. DEXA: summer 2017, osteopenia, repeat summer 2019. Colon ca screening: next colonoscopy  due 2027.  6) Elev bp w/out dx of HTN: monitor  home bp x 2 wks and call if avg is > 140/90 or greater.  7) L knee osteoarthritis: doing fine s/p TKA.  Spent 45 min with pt today, with >50% of this time spent in counseling and care coordination regarding the above problems.  An After Visit Summary was printed and given to the patient.  FOLLOW UP:  Return for make initial welcome to medicare visit appt prior to 08/31/17..  Signed:  Crissie Sickles, MD           05/08/2017

## 2017-05-09 LAB — HEPATITIS C ANTIBODY
Hepatitis C Ab: NONREACTIVE
SIGNAL TO CUT-OFF: 0.01 (ref <1.00)

## 2017-05-10 LAB — URINE CULTURE
MICRO NUMBER:: 90204211
SPECIMEN QUALITY:: ADEQUATE

## 2017-05-20 ENCOUNTER — Encounter: Payer: Self-pay | Admitting: *Deleted

## 2017-05-21 DIAGNOSIS — H1045 Other chronic allergic conjunctivitis: Secondary | ICD-10-CM | POA: Diagnosis not present

## 2017-06-03 ENCOUNTER — Other Ambulatory Visit: Payer: Self-pay | Admitting: Family Medicine

## 2017-06-05 ENCOUNTER — Encounter: Payer: Self-pay | Admitting: Family Medicine

## 2017-06-05 ENCOUNTER — Ambulatory Visit (INDEPENDENT_AMBULATORY_CARE_PROVIDER_SITE_OTHER): Payer: Medicare Other | Admitting: Family Medicine

## 2017-06-05 VITALS — BP 119/79 | HR 79 | Temp 97.3°F | Ht 60.0 in | Wt 149.0 lb

## 2017-06-05 DIAGNOSIS — R030 Elevated blood-pressure reading, without diagnosis of hypertension: Secondary | ICD-10-CM | POA: Diagnosis not present

## 2017-06-05 NOTE — Patient Instructions (Signed)
Continue monitor and record BP for 1 week with your cuff.  Bring in your results and your cuff to an appt with Dr Anitra Lauth in 1 week. This may just be your cuff reading incorrectly since your blood pressure was so good here.

## 2017-06-05 NOTE — Progress Notes (Signed)
Kelsey Bruce , 01-22-1951, 67 y.o., female MRN: [id number] Patient Care Team    Relationship Specialty Notifications Start End  McGowen, Adrian Blackwater, MD PCP - General Family Medicine  07/06/12   Irene Shipper, MD Consulting Physician Gastroenterology  08/05/12   Emily Filbert, MD Consulting Physician Obstetrics and Gynecology  10/11/13   Everitt Amber, MD Consulting Physician Obstetrics and Gynecology  10/11/13   Latanya Maudlin, MD Consulting Physician Orthopedic Surgery  01/22/15   Gean Birchwood, DPM Consulting Physician Podiatry  05/07/16     Chief Complaint  Patient presents with  . Hypertension    pt c/o of increase in blood pressure. she says her BP was 148/90 at 9pm/ 140/80 at 10pm on 06/04/17.     Subjective: Pt presents for an OV with complaints of increased blood pressure when checked yesterday. Patient reports she has had a couple increased blood pressures when monitoring her blood pressure home. She does not have a diagnosis of hypertension. She brings with her blood pressure readings at home that are 135-145/70-90. She states yesterday she took her blood pressure and it was 140/80 and she had a friend that is a nurse retake it with a different and it was 148/90. She denies any dizziness, headaches, chest pain or shortness of breath.    Depression screen [facility] 2/9 05/08/2017 08/08/2016 06/01/2015 12/16/2013 11/17/2013  Decreased Interest 0 0 0 0 0  Down, Depressed, Hopeless 0 0 0 0 0  PHQ - 2 Score 0 0 0 0 0    Allergies  Allergen Reactions  . Aspirin Nausea Only    High dose only  . Seasonal Ic [Cholestatin]    Social History   Tobacco Use  . Smoking status: Never Smoker  . Smokeless tobacco: Never Used  Substance Use Topics  . Alcohol use: No    Frequency: Never    Comment: occasional   Past Medical History:  Diagnosis Date  . Allergy   . Borderline hyperlipidemia    [place] CV risk 2017= 6%.  . Cataract   . Cerebral palsy ([place])   . Colon polyp, hyperplastic  2004; 12/2015   Recall 12/2025  . Difficulty sleeping   . Endometrial adenocarcinoma ([place]) 09/2013   Dr. Denman George: pt got vaginal brachytherapy via rad onc.  No sign of dz recurrence as of 02/2016 Gyn Onc f/u.  Next f/u with them is 6 mo.  . Morton's neuroma 2012   Dr. Roseanne Reno did injections in the past (left foot)  . Osteoarthritis    LB, HIPs, knees (L knee end stage), hands  . Radiation 12/16/13, 12/22/13, 12/30/13, 01/06/14, 01/13/14   proximal vagina 30 gray  . Seasonal allergic rhinitis    Past Surgical History:  Procedure Laterality Date  . COLONOSCOPY  09/2002; 12/2015   2004 hyperplastic; recall sent 2009 but pt apparently didn't respond.  12/2015: one sessile serrated polyp w/out cytologic atypia: recall 10 yrs per GI.  Marland Kitchen DEXA  08/2007; 11/14/15   2009 T score -1.2.  2017 T score -1.6.  . DILATION AND CURETTAGE OF UTERUS    . LEG SURGERY Left 01/2014   "cleaned out cartilage"  . ROBOTIC ASSISTED TOTAL HYSTERECTOMY WITH BILATERAL SALPINGO OOPHERECTOMY Bilateral 10/05/2013   Procedure: ROBOTIC ASSISTED TOTAL HYSTERECTOMY WITH BILATERAL SALPINGO OOPHORECTOMY WITH LYMPH NODE DISECTION;  Surgeon: Everitt Amber, MD;  Location: [facility];  Service: Gynecology;  Laterality: Bilateral;  . TONSILLECTOMY    . TOTAL KNEE ARTHROPLASTY Left 12/07/2014   Procedure:  TOTAL KNEE ARTHROPLASTY;  Surgeon: Latanya Maudlin, MD;  Location: [facility];  Service: Orthopedics;  Laterality: Left;  . TUBAL LIGATION     Family History  Problem Relation Age of Onset  . Heart disease Father   . Hypertension Father   . Cancer Father        lung  . Arthritis Father   . Alcohol abuse Father   . Heart disease Mother   . Cancer Mother        lung  . Deep vein thrombosis Mother   . Alcohol abuse Mother   . Heart disease Brother   . Hypertension Brother   . Heart disease Brother   . Hypertension Brother   . Heart disease Brother   . Hypertension Brother   . Stomach cancer Maternal Grandfather   . Colon cancer Neg Hx     . Esophageal cancer Neg Hx   . Rectal cancer Neg Hx    Allergies as of 06/05/2017      Reactions   Aspirin Nausea Only   High dose only   Seasonal Ic [cholestatin]       Medication List        Accurate as of 06/05/17 10:39 AM. Always use your most recent med list.          aspirin EC 81 MG tablet Take 81 mg by mouth daily.   beta carotene w/minerals tablet Take 1 tablet by mouth every morning. Reported on 06/01/2015   CALCIUM PO Take 1 tablet by mouth daily.   cetirizine 10 MG tablet Commonly known as:  ZYRTEC Take 10 mg by mouth daily as needed for allergies. Reported on 06/01/2015   cyclobenzaprine 10 MG tablet Commonly known as:  FLEXERIL Take 1 tablet (10 mg total) by mouth 3 (three) times daily as needed for muscle spasms.   docusate sodium 100 MG capsule Commonly known as:  COLACE Take 100 mg by mouth daily as needed for mild constipation. Reported on 06/01/2015   fluticasone 0.05 % cream Commonly known as:  CUTIVATE Apply topically 2 (two) times daily.   meloxicam 15 MG tablet Commonly known as:  MOBIC Take 1 tablet (15 mg total) by mouth daily.   pantoprazole 40 MG tablet Commonly known as:  PROTONIX Take 1 tablet (40 mg total) by mouth daily.   polyethylene glycol packet Commonly known as:  MIRALAX / GLYCOLAX Take 17 g by mouth daily as needed for mild constipation. Reported on 06/01/2015   PROBIOTIC PO Take 1 tablet by mouth every morning. Reported on 06/01/2015   VITAMIN D PO Take 1 tablet by mouth daily.       All past medical history, surgical history, allergies, family history, immunizations andmedications were updated in the EMR today and reviewed under the history and medication portions of their EMR.     ROS: Negative, with the exception of above mentioned in HPI   Objective:  BP 119/79 (BP Location: Right Arm, Patient Position: Sitting, Cuff Size: Large)   Pulse 79   Temp (!) 97.3 F (36.3 C) (Oral)   Ht 5' (1.524 m)   Wt 149 lb (67.6  kg)   SpO2 93%   BMI 29.10 kg/m  Body mass index is 29.1 kg/m. Gen: Afebrile. No acute distress. Nontoxic in appearance, well developed, well nourished.  HENT: AT. [place].MMM, no oral lesions.  Eyes:Pupils Equal Round Reactive to light, Extraocular movements intact,  Conjunctiva without redness, discharge or icterus. CV: RRR no murmur, noedema Chest: CTAB, no  wheeze or crackles. Good air movement, normal resp effort.  Abd: Soft. NTND. BS present.  Neuro:  Normal gait. PERLA. EOMi. Alert. Oriented x3   No exam data present No results found. No results found for this or any previous visit (from the past 24 hour(s)).  Assessment/Plan: BETHANNIE IGLEHART is a 67 y.o. female present for OV for  1. Elevated BP without diagnosis of hypertension Patient's blood pressure today in the office looks excellent. Her recorded blood pressures at home are all borderline with a few elevated pressures. Given that her pressures here are perfect, I have encouraged her to continue monitoring and other week  At different times through the day, recording these and bring them in with her to be reevaluated in one week with her PCP. She was also strongly encouraged to bring in her home monitoring blood pressure cuff so that we can compare it to the readings we have with our cuff to see if there is an inaccuracy in the cuffs. Patient was educated on symptoms of elevated blood pressure and she will monitor for these as well Follow-up one week with PCP   Reviewed expectations re: course of current medical issues.  Discussed self-management of symptoms.  Outlined signs and symptoms indicating need for more acute intervention.  Patient verbalized understanding and all questions were answered.  Patient received an After-Visit Summary.    No orders of the defined types were placed in this encounter.    Note is dictated utilizing voice recognition software. Although note has been proof read prior to signing,  occasional typographical errors still can be missed. If any questions arise, please do not hesitate to call for verification.   electronically signed by:  Howard Pouch, DO  [place]

## 2017-06-06 ENCOUNTER — Ambulatory Visit: Payer: Medicare Other

## 2017-06-12 ENCOUNTER — Ambulatory Visit (INDEPENDENT_AMBULATORY_CARE_PROVIDER_SITE_OTHER): Payer: Medicare Other | Admitting: Family Medicine

## 2017-06-12 ENCOUNTER — Encounter: Payer: Self-pay | Admitting: Family Medicine

## 2017-06-12 VITALS — BP 130/83 | HR 70 | Temp 97.3°F | Ht 60.0 in | Wt 148.0 lb

## 2017-06-12 DIAGNOSIS — R030 Elevated blood-pressure reading, without diagnosis of hypertension: Secondary | ICD-10-CM

## 2017-06-12 NOTE — Progress Notes (Signed)
OFFICE VISIT  06/12/2017   CC:  Chief Complaint  Patient presents with  . Follow-up    HTN   HPI:    Patient is a 67 y.o. Caucasian female who presents for 1 week f/u elevated bp's at home recently w/out dx of HTN. Saw Dr. Raoul Pitch 1 week ago and reported home bp's 130s-140s over 80s-90.  She was asymptomatic. BP and HR here at the visit last week:119/79, HR 79. No meds started.  Continued home monitoring and f/u with me today was recommended.  Also, was instructed to bring her bp cuff in and get reading with this compared with our cuff here. No hx of renal dysfunction or electrolyte abnormality.  Home bp's since last visit show syst 118-151 over 73-84.  Avg syst is low 130s.  She admits that she was emotionally upset during the times of elevated bp's.  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   Her emotions about these deaths has calmed down considerably. No CP, no SOB, no HAs, no palpitations, no dizziness.   Past Medical History:  Diagnosis Date  . Allergy   . Borderline hyperlipidemia    F[place]ingham CV risk 2017= 6%.  . Cataract   . Cerebral palsy ([place])   . Colon polyp, hyperplastic 2004; 12/2015   Recall 12/2025  . Difficulty sleeping   . Endometrial adenocarcinoma ([place]) 09/2013   Dr. Denman George: pt got vaginal brachytherapy via rad onc.  No sign of dz recurrence as of 02/2016 Gyn Onc f/u.  Next f/u with them is 6 mo.  . Morton's neuroma 2012   Dr. Roseanne Reno did injections in the past (left foot)  . Osteoarthritis    LB, HIPs, knees (L knee end stage), hands  . Radiation 12/16/13, 12/22/13, 12/30/13, 01/06/14, 01/13/14   proximal vagina 30 gray  . Seasonal allergic rhinitis     Past Surgical History:  Procedure Laterality Date  . COLONOSCOPY  09/2002; 12/2015   2004 hyperplastic; recall sent 2009 but pt apparently didn't respond.  12/2015: one sessile serrated polyp w/out cytologic atypia: recall 10 yrs per GI.  Marland Kitchen DEXA  08/2007; 11/14/15   2009  T score -1.2.  2017 T score -1.6.  . DILATION AND CURETTAGE OF UTERUS    . LEG SURGERY Left 01/2014   "cleaned out cartilage"  . ROBOTIC ASSISTED TOTAL HYSTERECTOMY WITH BILATERAL SALPINGO OOPHERECTOMY Bilateral 10/05/2013   Procedure: ROBOTIC ASSISTED TOTAL HYSTERECTOMY WITH BILATERAL SALPINGO OOPHORECTOMY WITH LYMPH NODE DISECTION;  Surgeon: Everitt Amber, MD;  Location: [facility];  Service: Gynecology;  Laterality: Bilateral;  . TONSILLECTOMY    . TOTAL KNEE ARTHROPLASTY Left 12/07/2014   Procedure: TOTAL KNEE ARTHROPLASTY;  Surgeon: Latanya Maudlin, MD;  Location: [facility];  Service: Orthopedics;  Laterality: Left;  . TUBAL LIGATION      Outpatient Medications Prior to Visit  Medication Sig Dispense Refill  . aspirin EC 81 MG tablet Take 81 mg by mouth daily.    . beta carotene w/minerals (OCUVITE) tablet Take 1 tablet by mouth every morning. Reported on 06/01/2015    . CALCIUM PO Take 1 tablet by mouth daily.    . cetirizine (ZYRTEC) 10 MG tablet Take 10 mg by mouth daily as needed for allergies. Reported on 06/01/2015    . Cholecalciferol (VITAMIN D PO) Take 1 tablet by mouth daily.    . cyclobenzaprine (FLEXERIL) 10 MG tablet Take 1 tablet (10 mg total) by mouth 3 (three) times daily as needed  for muscle spasms. 30 tablet 0  . docusate sodium (COLACE) 100 MG capsule Take 100 mg by mouth daily as needed for mild constipation. Reported on 06/01/2015    . fluticasone (CUTIVATE) 0.05 % cream Apply topically 2 (two) times daily. 60 g 1  . meloxicam (MOBIC) 15 MG tablet Take 1 tablet (15 mg total) by mouth daily. 30 tablet 5  . pantoprazole (PROTONIX) 40 MG tablet Take 1 tablet (40 mg total) by mouth daily. 30 tablet 3  . polyethylene glycol (MIRALAX / GLYCOLAX) packet Take 17 g by mouth daily as needed for mild constipation. Reported on 06/01/2015    . Probiotic Product (PROBIOTIC PO) Take 1 tablet by mouth every morning. Reported on 06/01/2015     No facility-administered medications prior to visit.      Allergies  Allergen Reactions  . Aspirin Nausea Only    High dose only  . Seasonal Ic [Cholestatin]     ROS As per HPI  PE: Blood pressure 130/83, pulse 70, temperature (!) 97.3 F (36.3 C), temperature source Oral, height 5' (1.524 m), weight 148 lb (67.1 kg), SpO2 96 %.BP with pt's cuff today was 149 over mid 80s.  Recheck with out manual cuff was still 136/78. Gen: Alert, well appearing.  Patient is oriented to person, place, time, and situation. AFFECT: pleasant, lucid thought and speech. CV: RRR, no m/r/g.   LUNGS: CTA bilat, nonlabored resps, good aeration in all lung fields. Radial pulses 2+ bilat.   LABS:    Chemistry      Component Value Date/Time   NA 139 05/08/2017 1131   K 4.2 05/08/2017 1131   CL 103 05/08/2017 1131   CO2 31 05/08/2017 1131   BUN 17 05/08/2017 1131   CREATININE 0.80 05/08/2017 1131   CREATININE 0.96 09/10/2013 1159      Component Value Date/Time   CALCIUM 9.8 05/08/2017 1131   ALKPHOS 58 05/08/2017 1131   AST 14 05/08/2017 1131   ALT 13 05/08/2017 1131   BILITOT 0.7 05/08/2017 1131      IMPRESSION AND PLAN:  1) Elevated bp's w/out dx of HTN:  Her avg is acceptable but she'll continue to monitor periodically at home. Seems like this may have been secondary to some emotional turmoil she was having--this is better now.  2) Use of aspirin: we discussed the latest research results regarding use of ASA as primary prevention. She is low risk of CV dz.  She has never had any GI or other hemorrhage.  She does take meloxicam daily for chronic knee pain. Pt expressed understanding today and we decided to D/C aspirin that she is using for primary CV dz prevention.  Spent 25 min with pt today, with >50% of this time spent in counseling and care coordination regarding the above problems.  An After Visit Summary was printed and given to the patient.  FOLLOW UP: Return in about 6 months (around 12/13/2017) for routine chronic illness  f/u.  Signed:  Crissie Sickles, MD           06/12/2017

## 2017-06-23 ENCOUNTER — Ambulatory Visit
Admission: RE | Admit: 2017-06-23 | Discharge: 2017-06-23 | Disposition: A | Discharge status: Home / Self Care | Payer: Medicare Other | Source: Ambulatory Visit | Attending: Family Medicine | Admitting: Family Medicine

## 2017-06-23 DIAGNOSIS — Z1231 Encounter for screening mammogram for malignant neoplasm of breast: Secondary | ICD-10-CM | POA: Diagnosis not present

## 2017-06-23 DIAGNOSIS — Z1239 Encounter for other screening for malignant neoplasm of breast: Secondary | ICD-10-CM

## 2017-06-25 ENCOUNTER — Ambulatory Visit: Payer: Medicare Other

## 2017-07-17 ENCOUNTER — Ambulatory Visit: Payer: Medicare Other | Admitting: Family Medicine

## 2017-07-21 ENCOUNTER — Ambulatory Visit (INDEPENDENT_AMBULATORY_CARE_PROVIDER_SITE_OTHER): Payer: Medicare Other | Admitting: Family Medicine

## 2017-07-21 ENCOUNTER — Encounter: Payer: Self-pay | Admitting: Family Medicine

## 2017-07-21 VITALS — BP 122/80 | HR 73 | Temp 98.0°F | Resp 16 | Ht 60.0 in | Wt 147.5 lb

## 2017-07-21 DIAGNOSIS — E2839 Other primary ovarian failure: Secondary | ICD-10-CM | POA: Diagnosis not present

## 2017-07-21 DIAGNOSIS — Z0000 Encounter for general adult medical examination without abnormal findings: Secondary | ICD-10-CM | POA: Diagnosis not present

## 2017-07-21 NOTE — Progress Notes (Signed)
WELCOME TO MEDICARE (IPPE) VISIT I explained that today's visit was for the purpose of health promotion and disease detection, as well as an introduction to Medicare and it's covered benefits.  I explained that no labs or other services would be performed today, but if any were determined to be necessary then appropriate orders/referrals would be arranged for these to be done at a future date.  Patient is a 67 y/o white female who is already an established patient with me.  Pt's medical and social history were reviewed. Specifically, we reviewed PMH/PSH/Meds/FH.  Also reviewed alcohol, tobacco, and illicit drug use.  Diet and physical activity reviewed.    Diet: "I eat what I want".  Tries to avoid fast food.  She has a goal of limiting calories over time so she gets to a wt of 135 lbs. Exercise: walking most days, leg exercises.  Has an exercise bike and uses this some.  Past Medical History:  Diagnosis Date  . Allergy   . Borderline hyperlipidemia    Framingham CV risk 2017= 6%.  . Cataract   . Cerebral palsy ([place])   . Colon polyp, hyperplastic 2004; 12/2015   Recall 12/2025  . Difficulty sleeping   . Endometrial adenocarcinoma ([place]) 09/2013   Dr. Denman George: pt got vaginal brachytherapy via rad onc.  No sign of dz recurrence as of 02/2016 Gyn Onc f/u.  Next f/u with them is 6 mo.  . Morton's neuroma 2012   Dr. Roseanne Reno did injections in the past (left foot)  . Osteoarthritis    LB, HIPs, knees (L knee end stage), hands  . Radiation 12/16/13, 12/22/13, 12/30/13, 01/06/14, 01/13/14   proximal vagina 30 gray  . Seasonal allergic rhinitis    Past Surgical History:  Procedure Laterality Date  . COLONOSCOPY  09/2002; 12/2015   2004 hyperplastic; recall sent 2009 but pt apparently didn't respond.  12/2015: one sessile serrated polyp w/out cytologic atypia: recall 10 yrs per GI.  Marland Kitchen DEXA  08/2007; 11/14/15   2009 T score -1.2.  2017 T score -1.6.  . DILATION AND CURETTAGE OF UTERUS    . LEG  SURGERY Left 01/2014   "cleaned out cartilage"  . ROBOTIC ASSISTED TOTAL HYSTERECTOMY WITH BILATERAL SALPINGO OOPHERECTOMY Bilateral 10/05/2013   Procedure: ROBOTIC ASSISTED TOTAL HYSTERECTOMY WITH BILATERAL SALPINGO OOPHORECTOMY WITH LYMPH NODE DISECTION;  Surgeon: Everitt Amber, MD;  Location: [facility];  Service: Gynecology;  Laterality: Bilateral;  . TONSILLECTOMY    . TOTAL KNEE ARTHROPLASTY Left 12/07/2014   Procedure: TOTAL KNEE ARTHROPLASTY;  Surgeon: Latanya Maudlin, MD;  [facility]: [facility];  Service: Orthopedics;  Laterality: Left;  . TUBAL LIGATION     Social History   Socioeconomic History  . Marital status: Divorced    Spouse name: Not on file  . Number of children: 2  . Years of education: Not on file  . Highest education level: Not on file  Occupational History  . Occupation: retired  Scientific laboratory technician  . Financial resource strain: Not on file  . Food insecurity:    Worry: Not on file    Inability: Not on file  . Transportation needs:    Medical: Not on file    Non-medical: Not on file  Tobacco Use  . Smoking status: Never Smoker  . Smokeless tobacco: Never Used  Substance and Sexual Activity  . Alcohol use: No    Frequency: Never    Comment: occasional  . Drug use: No  . Sexual activity: Not on file  Lifestyle  . Physical activity:    Days per week: Not on file    Minutes per session: Not on file  . Stress: Not on file  Relationships  . Social connections:    Talks on phone: Not on file    Gets together: Not on file    Attends religious service: Not on file    Active member of club or organization: Not on file    Attends meetings of clubs or organizations: Not on file    Relationship status: Not on file  Other Topics Concern  . Not on file  Social History Narrative   Divorced.  Two children.   Orig from [place], [place].   HS education.   Retired.   No tobacco, occ alcohol, no drugs.   Has 3 brothers and they all smoked, as did her parents.     All of this  info is also found in the appropriate sections of pt's EMR.  Pt was screened with appropriate screening instrument for depression.  Current or past experiences with mood disorders was discussed.  No problems with this.  Depression screen PHQ 2/9 07/21/2017  Decreased Interest 0  Down, Depressed, Hopeless 0  PHQ - 2 Score 0    Fall Risk  07/21/2017 05/08/2017 08/08/2016 06/01/2015 02/24/2014  Falls in the past year? Yes Yes Yes No Yes  Comment - - - - -  Number falls in past yr: 2 or more 2 or more 1 - 2 or more  Injury with Fall? No No No - -  Comment - - - - -  Risk Factor Category  - - High Fall Risk - -  Risk for fall due to : - - - - Impaired balance/gait  Follow up Falls evaluation completed Falls prevention discussed - - -    Pt's functional ability and level of safety were reviewed. Specifically, I screened for hearing impairment and fall risk.  I assessed home safety and we discussed pt's competency with activities of daily living.  She is independent of all ADLs, home safety definitely adequate.  EXAM: Vitals:   07/21/17 0906  BP: 122/80  Pulse: 73  Resp: 16  Temp: 98 F (36.7 C)  SpO2: 98%   Body mass index is 28.81 kg/m.   Visual acuity screen: No exam data present  Pt gets routine eye care through optometrist/ophthalmologist and is up to date with follow up care with this provider--next routine f/u 10/2017. No additional physical exam required or indicated today.  End of life planning: Advanced directives and power of attorney information specific to the patient were discussed.  She is set to address this in the next few weeks.  Advanced directives and [facility] POA handout packet given to pt today.   Education, counseling, and referral for other preventive services: Written checklist was completed and given to pt for obtaining, as appropriate, the other preventive services that are covered as separate Medicare Part B benefits. Possible services that were reviewed with pt  are:  -annual wellness visit (AWV) -Bone mass measurements: last was 11/14/15.  We'll order this test to be scheduled after 11/13/17 ([facility] [place]). -Cardiovascular screening blood tests: discussed today. -Colorectal cancer screening: next colonoscopy due 10/2025. -Counseling to prevent tobacco use: n/a -Diabetes screening tests: borderline in the past.  Plan to repeat fasting glucose 11/2017 f/u. -Diabetes self-management training (DSMT): n/a at this time. -Glaucoma screening: pt has eye MD following her annually. -HIV screening: pt declines. -Medical nutrition therapy: n/a at  this time. -Prostate cancer screening: n/a -Seasonal influenza, pneumococcal, and Hep B vaccines:  Hep n/a, pneumovax done, repeat due 06/2018.  Prevnar--pt agrees to this but wants to defer until next f/u 11/2017. -screening mammography: had mammo 06/23/17.  Next due 06/24/18. -screening pap tests and pelvic exam: n/a b/c pt had hysterectomy for endometrial cancer. -ultrasound screening for AAA: pt not at risk/doesn't qualify for screening.  Of the above listed services, no referrals were made today but a plan is in place for some as noted above. (Prevnar and fasting glucose at next f/u 11/2017).  Patient did not have an additional complaint/problem that was discussed and evaluated today.  Patient was given opportunity to ask any additional questions regarding Medicare and covered benefits.  Patient was informed that Medicare does not provide coverage for routine physical exams.  I answered all questions to the best of my ability today.    An After Visit Summary was printed and given to the patient.  Follow up: Return in about 5 months (around 12/21/2017) for routine chronic illness f/u (repeat fasting gluc).  Signed:  Crissie Sickles, MD           07/21/2017

## 2017-08-14 ENCOUNTER — Encounter: Payer: Self-pay | Admitting: Radiation Oncology

## 2017-08-14 ENCOUNTER — Ambulatory Visit
Admission: RE | Admit: 2017-08-14 | Discharge: 2017-08-14 | Disposition: A | Discharge status: Home / Self Care | Payer: Medicare Other | Source: Ambulatory Visit | Attending: Radiation Oncology | Admitting: Radiation Oncology

## 2017-08-14 ENCOUNTER — Other Ambulatory Visit: Payer: Self-pay

## 2017-08-14 VITALS — BP 124/85 | HR 73 | Temp 98.5°F | Resp 18 | Wt 146.8 lb

## 2017-08-14 DIAGNOSIS — Z79899 Other long term (current) drug therapy: Secondary | ICD-10-CM | POA: Insufficient documentation

## 2017-08-14 DIAGNOSIS — Z7982 Long term (current) use of aspirin: Secondary | ICD-10-CM | POA: Insufficient documentation

## 2017-08-14 DIAGNOSIS — C541 Malignant neoplasm of endometrium: Secondary | ICD-10-CM

## 2017-08-14 DIAGNOSIS — Z08 Encounter for follow-up examination after completed treatment for malignant neoplasm: Secondary | ICD-10-CM | POA: Diagnosis not present

## 2017-08-14 DIAGNOSIS — Z8542 Personal history of malignant neoplasm of other parts of uterus: Secondary | ICD-10-CM | POA: Insufficient documentation

## 2017-08-14 NOTE — Progress Notes (Signed)
Radiation Oncology         [phone] ________________________________  Name: Kelsey Bruce MRN: [id number]  Date: 08/14/2017  DOB: 01-Aug-1950  Follow-Up Visit Note  CC: McGowen, Adrian Blackwater, MD  Everitt Amber, MD    ICD-10-CM   1. Endometrial cancer, grade I ([place]) C54.1     Diagnosis:   67 y.o. female with Stage IB Grade II endometrioid adenocarcinoma  Interval Since Last Radiation:  3 years 7 months  Radiation treatment dates:   12/16/2013, 12/22/2013, 12/30/2013, 01/06/2014, 01/13/2014 Site/dose:   Proximal vagina / 30 Gy in 5 fractions  Narrative:  The patient returns today for routine follow-up.  She denies having any new medical problems or new surgeries in the past year. She states that she is no longer following with Dr. Denman George. She denies any pain. She denies nausea, vomiting, or diarrhea. She reports urinary frequency and urgency but denies dysuria. She denies any vaginal or rectal bleeding. She denies any skin irritation.                              ALLERGIES:  is allergic to aspirin and seasonal ic [cholestatin].  Meds: Current Outpatient Medications  Medication Sig Dispense Refill  . aspirin EC 81 MG tablet Taking every 2-3 days    . beta carotene w/minerals (OCUVITE) tablet Take 1 tablet by mouth every morning. Reported on 06/01/2015    . CALCIUM PO Take 1 tablet by mouth daily.    . cetirizine (ZYRTEC) 10 MG tablet Take 10 mg by mouth daily as needed for allergies. Reported on 06/01/2015    . Cholecalciferol (VITAMIN D PO) Take 1 tablet by mouth daily.    . cyclobenzaprine (FLEXERIL) 10 MG tablet Take 1 tablet (10 mg total) by mouth 3 (three) times daily as needed for muscle spasms. 30 tablet 0  . docusate sodium (COLACE) 100 MG capsule Take 100 mg by mouth daily as needed for mild constipation. Reported on 06/01/2015    . meloxicam (MOBIC) 15 MG tablet Take 1 tablet (15 mg total) by mouth daily. 30 tablet 5  . pantoprazole (PROTONIX) 40 MG tablet Take 1 tablet (40 mg  total) by mouth daily. 30 tablet 3  . polyethylene glycol (MIRALAX / GLYCOLAX) packet Take 17 g by mouth daily as needed for mild constipation. Reported on 06/01/2015    . Probiotic Product (PROBIOTIC PO) Take 1 tablet by mouth every morning. Reported on 06/01/2015     No current facility-administered medications for this encounter.     Physical Findings: The patient is in no acute distress. Patient is alert and oriented.  weight is 146 lb 12.8 oz (66.6 kg). Her oral temperature is 98.5 F (36.9 C). Her blood pressure is 124/85 and her pulse is 73. Her respiration is 18 and oxygen saturation is 100%.   Lungs are clear to auscultation bilaterally. Heart has regular rate and rhythm. No palpable cervical, supraclavicular, or axillary adenopathy. Abdomen soft, non-tender, normal bowel sounds.  On pelvic examination the external genitalia were unremarkable. A speculum exam was performed. There are no mucosal lesions noted in the vaginal vault. On bimanual and rectovaginal examination there were no pelvic masses appreciated.  Lab Findings: Lab Results  Component Value Date   WBC 5.3 05/08/2017   HGB 14.6 05/08/2017   HCT 43.1 05/08/2017   MCV 90.1 05/08/2017   PLT 302.0 05/08/2017    Radiographic Findings: No results found.  Impression:  No evidence of recurrence clinical exam.   Plan:  Routine follow-up in 6 months.   ____________________________________  Blair Promise, PhD, MD  This document serves as a record of services personally performed by Gery Pray, MD. It was created on his behalf by Rae Lips, a trained medical scribe. The creation of this record is based on the scribe's personal observations and the provider's statements to them. This document has been checked and approved by the attending provider.

## 2017-08-14 NOTE — Progress Notes (Signed)
Pt here today for a 1 month follow up for endometrial cancer. Pt denies having any pain or fatigue. Pt denies having diarrhea. Pt denies having any nausea or vomiting. Pt denies having dysuria. Pt states that she has frequency and urgency. Pt denies having any vaginal or rectal bleeding. Pt denies having any skin irritation.  BP 124/85   Pulse 73   Temp 98.5 F (36.9 C) (Oral)   Resp 18   Wt 146 lb 12.8 oz (66.6 kg)   SpO2 100%   BMI 28.67 kg/m   Wt Readings from Last 3 Encounters:  08/14/17 146 lb 12.8 oz (66.6 kg)  07/21/17 147 lb 8 oz (66.9 kg)  06/12/17 148 lb (67.1 kg)

## 2017-09-15 ENCOUNTER — Ambulatory Visit (INDEPENDENT_AMBULATORY_CARE_PROVIDER_SITE_OTHER): Payer: Medicare Other | Admitting: Family Medicine

## 2017-09-15 ENCOUNTER — Encounter: Payer: Self-pay | Admitting: Family Medicine

## 2017-09-15 VITALS — BP 125/72 | HR 88 | Temp 98.3°F | Resp 16 | Ht 60.0 in | Wt 149.4 lb

## 2017-09-15 DIAGNOSIS — R319 Hematuria, unspecified: Secondary | ICD-10-CM

## 2017-09-15 DIAGNOSIS — R300 Dysuria: Secondary | ICD-10-CM | POA: Diagnosis not present

## 2017-09-15 DIAGNOSIS — N390 Urinary tract infection, site not specified: Secondary | ICD-10-CM | POA: Diagnosis not present

## 2017-09-15 LAB — POCT URINALYSIS DIPSTICK
BILIRUBIN UA: NEGATIVE
Glucose, UA: NEGATIVE
Ketones, UA: NEGATIVE
Nitrite, UA: POSITIVE
PH UA: 6 (ref 5.0–8.0)
Protein, UA: NEGATIVE
RBC UA: NEGATIVE
Spec Grav, UA: 1.025 (ref 1.010–1.025)
UROBILINOGEN UA: 0.2 U/dL

## 2017-09-15 MED ORDER — CIPROFLOXACIN HCL 500 MG PO TABS: 500.0000 mg | ORAL_TABLET | Freq: Two times a day (BID) | ORAL | 0 refills | Status: AC

## 2017-09-15 NOTE — Progress Notes (Signed)
OFFICE VISIT  09/15/2017   CC:  Chief Complaint  Patient presents with  . Urinary Tract Infection    ?    HPI:    Patient is a 67 y.o. Caucasian female who presents for urinary complaints. Onset 1 week ago: urgency, burning with urination, frequency (esp at night).  Having nausea w/out vomiting. Some loose stools.  No fever.  NO CVA or flank pain.  No blood in urine. Took AZO x 2 doses, last one yesterday morning. Little bit of vag d/c--nothing much more than her usual.  Past Medical History:  Diagnosis Date  . Allergy   . Borderline hyperlipidemia    [place] CV risk 2017= 6%.  . Cataract   . Cerebral palsy ([place])   . Colon polyp, hyperplastic 2004; 12/2015   Recall 12/2025  . Difficulty sleeping   . Endometrial adenocarcinoma ([place]) 09/2013   Dr. Denman George: pt got vaginal brachytherapy via rad onc.  No sign of dz recurrence as of 02/2016 Gyn Onc f/u.  Next f/u with them is 6 mo.  . Morton's neuroma 2012   Dr. Roseanne Reno did injections in the past (left foot)  . Osteoarthritis    LB, HIPs, knees (L knee end stage), hands  . Radiation 12/16/13, 12/22/13, 12/30/13, 01/06/14, 01/13/14   proximal vagina 30 gray  . Seasonal allergic rhinitis     Past Surgical History:  Procedure Laterality Date  . COLONOSCOPY  09/2002; 12/2015   2004 hyperplastic; recall sent 2009 but pt apparently didn't respond.  12/2015: one sessile serrated polyp w/out cytologic atypia: recall 10 yrs per GI.  Marland Kitchen DEXA  08/2007; 11/14/15   2009 T score -1.2.  2017 T score -1.6.  . DILATION AND CURETTAGE OF UTERUS    . LEG SURGERY Left 01/2014   "cleaned out cartilage"  . ROBOTIC ASSISTED TOTAL HYSTERECTOMY WITH BILATERAL SALPINGO OOPHERECTOMY Bilateral 10/05/2013   Procedure: ROBOTIC ASSISTED TOTAL HYSTERECTOMY WITH BILATERAL SALPINGO OOPHORECTOMY WITH LYMPH NODE DISECTION;  Surgeon: Everitt Amber, MD;  Location: [facility];  Service: Gynecology;  Laterality: Bilateral;  . TONSILLECTOMY    . TOTAL KNEE ARTHROPLASTY Left  12/07/2014   Procedure: TOTAL KNEE ARTHROPLASTY;  Surgeon: Latanya Maudlin, MD;  Location: [facility];  Service: Orthopedics;  Laterality: Left;  . TUBAL LIGATION      Outpatient Medications Prior to Visit  Medication Sig Dispense Refill  . aspirin EC 81 MG tablet Taking every 2-3 days    . beta carotene w/minerals (OCUVITE) tablet Take 1 tablet by mouth every morning. Reported on 06/01/2015    . CALCIUM PO Take 1 tablet by mouth daily.    . cetirizine (ZYRTEC) 10 MG tablet Take 10 mg by mouth daily as needed for allergies. Reported on 06/01/2015    . Cholecalciferol (VITAMIN D PO) Take 1 tablet by mouth daily.    . cyclobenzaprine (FLEXERIL) 10 MG tablet Take 1 tablet (10 mg total) by mouth 3 (three) times daily as needed for muscle spasms. 30 tablet 0  . docusate sodium (COLACE) 100 MG capsule Take 100 mg by mouth daily as needed for mild constipation. Reported on 06/01/2015    . meloxicam (MOBIC) 15 MG tablet Take 1 tablet (15 mg total) by mouth daily. 30 tablet 5  . pantoprazole (PROTONIX) 40 MG tablet Take 1 tablet (40 mg total) by mouth daily. 30 tablet 3  . polyethylene glycol (MIRALAX / GLYCOLAX) packet Take 17 g by mouth daily as needed for mild constipation. Reported on 06/01/2015    .  Probiotic Product (PROBIOTIC PO) Take 1 tablet by mouth every morning. Reported on 06/01/2015     No facility-administered medications prior to visit.     Allergies  Allergen Reactions  . Aspirin Nausea Only    High dose only  . Seasonal Ic [Cholestatin]     ROS As per HPI  PE: Blood pressure 125/72, pulse 88, temperature 98.3 F (36.8 C), temperature source Oral, resp. rate 16, height 5' (1.524 m), weight 149 lb 6 oz (67.8 kg), SpO2 96 %. Gen: Alert, well appearing.  Patient is oriented to person, place, time, and situation. AFFECT: pleasant, lucid thought and speech. No further exam today.  LABS:    Chemistry      Component Value Date/Time   NA 139 05/08/2017 1131   K 4.2 05/08/2017 1131   CL  103 05/08/2017 1131   CO2 31 05/08/2017 1131   BUN 17 05/08/2017 1131   CREATININE 0.80 05/08/2017 1131   CREATININE 0.96 09/10/2013 1159      Component Value Date/Time   CALCIUM 9.8 05/08/2017 1131   ALKPHOS 58 05/08/2017 1131   AST 14 05/08/2017 1131   ALT 13 05/08/2017 1131   BILITOT 0.7 05/08/2017 1131     CC UA today: 2 + Leukocytes, nitrite pos, o/w normal.  Color: yellow.  IMPRESSION AND PLAN:  UTI:  Cipro 500 mg bid x 5d. Sent urine for c/s.  An After Visit Summary was printed and given to the patient.  FOLLOW UP: Return if symptoms worsen or fail to improve.  Signed:  Crissie Sickles, MD           09/15/2017

## 2017-09-18 LAB — URINE CULTURE
MICRO NUMBER: 90754877
SPECIMEN QUALITY:: ADEQUATE

## 2017-11-06 ENCOUNTER — Telehealth: Payer: Self-pay | Admitting: Oncology

## 2017-11-06 NOTE — Telephone Encounter (Signed)
Patient called Althia Forts, RN in Radiation and said she had a couple incidences of spotting yesterday.  Called patient back and she said she was straining to have a bowel movement but did not notice any bleeding when wiping.  A little bit later she noticed a small amount of red spotting.  She is not sure where the blood was coming from and has not had any bleeding since.  Advised her to call back if she has any more spotting.  Also scheduled an appointment with Dr. Sondra Come on Thrusday, 11/13/17 at 8:30 am.  She verbalized understanding and agreement.

## 2017-11-13 ENCOUNTER — Ambulatory Visit
Admission: RE | Admit: 2017-11-13 | Discharge: 2017-11-13 | Disposition: A | Discharge status: Home / Self Care | Payer: Medicare Other | Source: Ambulatory Visit | Attending: Radiation Oncology | Admitting: Radiation Oncology

## 2017-11-13 ENCOUNTER — Other Ambulatory Visit: Payer: Self-pay

## 2017-11-13 ENCOUNTER — Encounter: Payer: Self-pay | Admitting: Radiation Oncology

## 2017-11-13 VITALS — BP 140/77 | HR 67 | Temp 97.8°F | Resp 20 | Ht 60.0 in | Wt 149.0 lb

## 2017-11-13 DIAGNOSIS — Z7982 Long term (current) use of aspirin: Secondary | ICD-10-CM | POA: Insufficient documentation

## 2017-11-13 DIAGNOSIS — K649 Unspecified hemorrhoids: Secondary | ICD-10-CM | POA: Diagnosis not present

## 2017-11-13 DIAGNOSIS — Z886 Allergy status to analgesic agent status: Secondary | ICD-10-CM | POA: Diagnosis not present

## 2017-11-13 DIAGNOSIS — C541 Malignant neoplasm of endometrium: Secondary | ICD-10-CM

## 2017-11-13 DIAGNOSIS — Z79899 Other long term (current) drug therapy: Secondary | ICD-10-CM | POA: Insufficient documentation

## 2017-11-13 DIAGNOSIS — Z08 Encounter for follow-up examination after completed treatment for malignant neoplasm: Secondary | ICD-10-CM | POA: Diagnosis not present

## 2017-11-13 NOTE — Progress Notes (Addendum)
Radiation Oncology         (336) [phone] ________________________________  Name: Kelsey Bruce MRN: [id number]  Date: 11/13/2017  DOB: 1950-07-11  Follow-Up Visit Note  CC: McGowen, Adrian Blackwater, MD  Everitt Amber, MD    ICD-10-CM   1. Endometrial cancer, grade I ([place]) C54.1     Diagnosis:   67 y.o. female with Stage IB Grade 2 Endometrioid Adenocarcinoma  Interval Since Last Radiation:  3 years 10 months  12/16/2013 - 01/13/2014: Proximal vagina / 30 Gy in 5 fractions  Narrative:  The patient returns today for concerns with spotting. She was scheduled to follow up with radiation oncology in November, but requested an appointment regarding the spotting last week. She denies pelvic pain and starting any new blood thinning medications.  On review of systems, she reports feeling well overall. She reports fatigue and some leakage. She denies pain, nausea, vomiting, diarrhea, dysuria or hematuria, frequency or urgency, vaginal or rectal bleeding, vaginal discharge, and skin irritation.   ALLERGIES:  is allergic to aspirin and seasonal ic [cholestatin].  Meds: Current Outpatient Medications  Medication Sig Dispense Refill  . aspirin EC 81 MG tablet Taking every 2-3 days    . beta carotene w/minerals (OCUVITE) tablet Take 1 tablet by mouth every morning. Reported on 06/01/2015    . CALCIUM PO Take 1 tablet by mouth daily.    . cetirizine (ZYRTEC) 10 MG tablet Take 10 mg by mouth daily as needed for allergies. Reported on 06/01/2015    . Cholecalciferol (VITAMIN D PO) Take 1 tablet by mouth daily.    . cyclobenzaprine (FLEXERIL) 10 MG tablet Take 1 tablet (10 mg total) by mouth 3 (three) times daily as needed for muscle spasms. 30 tablet 0  . docusate sodium (COLACE) 100 MG capsule Take 100 mg by mouth daily as needed for mild constipation. Reported on 06/01/2015    . meloxicam (MOBIC) 15 MG tablet Take 1 tablet (15 mg total) by mouth daily. 30 tablet 5  . pantoprazole (PROTONIX) 40 MG tablet Take  1 tablet (40 mg total) by mouth daily. 30 tablet 3  . polyethylene glycol (MIRALAX / GLYCOLAX) packet Take 17 g by mouth daily as needed for mild constipation. Reported on 06/01/2015    . Probiotic Product (PROBIOTIC PO) Take 1 tablet by mouth every morning. Reported on 06/01/2015     No current facility-administered medications for this encounter.     Physical Findings: The patient is in no acute distress. Patient is alert and oriented.  height is 5' (1.524 m) and weight is 149 lb (67.6 kg). Her oral temperature is 97.8 F (36.6 C). Her blood pressure is 140/77 and her pulse is 67. Her respiration is 20 and oxygen saturation is 98%.   Lungs are clear to auscultation bilaterally. Heart has regular rate and rhythm. No palpable cervical, supraclavicular, or axillary adenopathy. Abdomen soft, non-tender, normal bowel sounds.  On pelvic examination the external genitalia were unremarkable. A speculum exam was performed. There are no mucosal lesions noted in the vaginal vault, sl. Erythema to cuff. On bimanual and rectovaginal examination there were no pelvic masses appreciated. Patient has some small, external hemorrhoids. No endo- rectal masses appreciated.   Lab Findings: Lab Results  Component Value Date   WBC 5.3 05/08/2017   HGB 14.6 05/08/2017   HCT 43.1 05/08/2017   MCV 90.1 05/08/2017   PLT 302.0 05/08/2017    Radiographic Findings: No results found.  Impression:  Stage IB Grade 2 Endometrioid  Adenocarcinoma No evidence of recurrence clinical exam. The patient's bleeding was likely related to her issues with hemorrhoids.  Plan:  Her scheduled follow up in November will be cancelled, and she will follow-up in 6 months from today.  ____________________________________  Blair Promise, PhD, MD  This document serves as a record of services personally performed by Gery Pray, MD. It was created on his behalf by Wilburn Mylar, a trained medical scribe. The creation of this  record is based on the scribe's personal observations and the provider's statements to them. This document has been checked and approved by the attending provider.

## 2017-11-13 NOTE — Progress Notes (Signed)
Pt here for a follow-up for endometrial cancer. Pt denies having any pain. Pt states that she has mild fatigue. Pt denies having diarrhea. Pt denies having any nausea or vomiting. Pt states denies having any frequency or urgency. Pt states that she has some leakage. Pt denies having dysuria or hematuria. Pt states that she saw a small amount of dark red blood on her pad. Pt denies having any vaginal or rectal bleeding. Pt denies having any vaginal discharge. Pt denies having skin irritation.  BP 140/77 (BP Location: Left Arm, Patient Position: Sitting)   Pulse 67   Temp 97.8 F (36.6 C) (Oral)   Resp 20   Ht 5' (1.524 m)   Wt 149 lb (67.6 kg)   SpO2 98%   BMI 29.10 kg/m   Wt Readings from Last 3 Encounters:  11/13/17 149 lb (67.6 kg)  09/15/17 149 lb 6 oz (67.8 kg)  08/14/17 146 lb 12.8 oz (66.6 kg)

## 2017-11-17 ENCOUNTER — Ambulatory Visit
Admission: RE | Admit: 2017-11-17 | Discharge: 2017-11-17 | Disposition: A | Discharge status: Home / Self Care | Payer: Medicare Other | Source: Ambulatory Visit | Attending: Family Medicine | Admitting: Family Medicine

## 2017-11-17 DIAGNOSIS — E2839 Other primary ovarian failure: Secondary | ICD-10-CM

## 2017-11-17 DIAGNOSIS — Z780 Asymptomatic menopausal state: Secondary | ICD-10-CM | POA: Diagnosis not present

## 2017-11-17 DIAGNOSIS — M85852 Other specified disorders of bone density and structure, left thigh: Secondary | ICD-10-CM | POA: Diagnosis not present

## 2017-11-18 ENCOUNTER — Encounter: Payer: Self-pay | Admitting: *Deleted

## 2017-11-18 ENCOUNTER — Encounter: Payer: Self-pay | Admitting: Family Medicine

## 2017-12-23 ENCOUNTER — Ambulatory Visit (INDEPENDENT_AMBULATORY_CARE_PROVIDER_SITE_OTHER): Payer: Medicare Other | Admitting: Family Medicine

## 2017-12-23 ENCOUNTER — Encounter: Payer: Self-pay | Admitting: Family Medicine

## 2017-12-23 VITALS — BP 130/85 | HR 68 | Temp 97.9°F | Resp 16 | Ht 60.0 in | Wt 148.1 lb

## 2017-12-23 DIAGNOSIS — E663 Overweight: Secondary | ICD-10-CM

## 2017-12-23 DIAGNOSIS — M79604 Pain in right leg: Secondary | ICD-10-CM

## 2017-12-23 DIAGNOSIS — E7800 Pure hypercholesterolemia, unspecified: Secondary | ICD-10-CM | POA: Diagnosis not present

## 2017-12-23 DIAGNOSIS — M25551 Pain in right hip: Secondary | ICD-10-CM

## 2017-12-23 DIAGNOSIS — Z131 Encounter for screening for diabetes mellitus: Secondary | ICD-10-CM | POA: Diagnosis not present

## 2017-12-23 DIAGNOSIS — K219 Gastro-esophageal reflux disease without esophagitis: Secondary | ICD-10-CM | POA: Diagnosis not present

## 2017-12-23 DIAGNOSIS — Z23 Encounter for immunization: Secondary | ICD-10-CM | POA: Diagnosis not present

## 2017-12-23 LAB — LIPID PANEL
CHOL/HDL RATIO: 4
Cholesterol: 200 mg/dL (ref 0–200)
HDL: 44.9 mg/dL (ref >39.00)
LDL Cholesterol: 134 mg/dL — ABNORMAL HIGH (ref 0–99)
NONHDL: 155.33
TRIGLYCERIDES: 105 mg/dL (ref 0.0–149.0)
VLDL: 21 mg/dL (ref 0.0–40.0)

## 2017-12-23 LAB — GLUCOSE, RANDOM: Glucose, Bld: 99 mg/dL (ref 70–99)

## 2017-12-23 MED ORDER — PANTOPRAZOLE SODIUM 40 MG PO TBEC: 40.0000 mg | DELAYED_RELEASE_TABLET | Freq: Every day | ORAL | 1 refills | Status: DC

## 2017-12-23 NOTE — Progress Notes (Signed)
OFFICE VISIT  12/23/2017   CC:  Chief Complaint  Patient presents with  . Follow-up    RCI, pt is fasting.    HPI:    Patient is a 67 y.o. Caucasian female who presents for f/u borderline hyperlipidemia and also to discuss right leg pain Additionally, we planned on doing annual diabetes screening today with fasting glucose check.  Onset about 6 mo ago, right leg pain starting at shin and sometimes extends up anterolateral aspect of R leg up to hip/gluteal area.  Getting up and walking around does not make the pain worse.  Says the pain feels deep, in "bones". Dull ache also on top of foot.  No swelling any joints of R leg or any edema.   Had been doing stationary bike, so she stopped doing this and the pain has continued. No preceding injury or strain.    Past Medical History:  Diagnosis Date  . Allergy   . Borderline hyperlipidemia    [place] CV risk 2017= 6%.  . Cataract   . Cerebral palsy ([place])   . Colon polyp, hyperplastic 2004; 12/2015   Recall 12/2025  . Difficulty sleeping   . Endometrial adenocarcinoma ([place]) 09/2013   Dr. Denman George: pt got vaginal brachytherapy via rad onc.  No sign of dz recurrence as of 02/2016 Gyn Onc f/u.  Next f/u with them is 6 mo.  . Morton's neuroma 2012   Dr. Roseanne Reno did injections in the past (left foot)  . Osteoarthritis    LB, HIPs, knees (L knee end stage), hands  . Osteopenia    DEXA 10/2017 was -1.5.  Repeat 10/2019.  . Radiation 12/16/13, 12/22/13, 12/30/13, 01/06/14, 01/13/14   proximal vagina 30 gray  . Seasonal allergic rhinitis     Past Surgical History:  Procedure Laterality Date  . COLONOSCOPY  09/2002; 12/2015   2004 hyperplastic; recall sent 2009 but pt apparently didn't respond.  12/2015: one sessile serrated polyp w/out cytologic atypia: recall 10 yrs per GI.  Marland Kitchen DEXA  08/2007; 11/14/15; 10/2017   2009 T score -1.2.  2017 T score -1.6.  2019 T score -1.5.  Plan recheck DEXA 10/2019.  Marland Kitchen DILATION AND CURETTAGE OF UTERUS    . LEG  SURGERY Left 01/2014   "cleaned out cartilage"  . ROBOTIC ASSISTED TOTAL HYSTERECTOMY WITH BILATERAL SALPINGO OOPHERECTOMY Bilateral 10/05/2013   Procedure: ROBOTIC ASSISTED TOTAL HYSTERECTOMY WITH BILATERAL SALPINGO OOPHORECTOMY WITH LYMPH NODE DISECTION;  Surgeon: Everitt Amber, MD;  Location: [facility];  Service: Gynecology;  Laterality: Bilateral;  . TONSILLECTOMY    . TOTAL KNEE ARTHROPLASTY Left 12/07/2014   Procedure: TOTAL KNEE ARTHROPLASTY;  Surgeon: Latanya Maudlin, MD;  Location: [facility];  Service: Orthopedics;  Laterality: Left;  . TUBAL LIGATION      Outpatient Medications Prior to Visit  Medication Sig Dispense Refill  . beta carotene w/minerals (OCUVITE) tablet Take 1 tablet by mouth every morning. Reported on 06/01/2015    . CALCIUM PO Take 1 tablet by mouth daily.    . cetirizine (ZYRTEC) 10 MG tablet Take 10 mg by mouth daily as needed for allergies. Reported on 06/01/2015    . Cholecalciferol (VITAMIN D PO) Take 1 tablet by mouth daily.    . cyclobenzaprine (FLEXERIL) 10 MG tablet Take 1 tablet (10 mg total) by mouth 3 (three) times daily as needed for muscle spasms. 30 tablet 0  . docusate sodium (COLACE) 100 MG capsule Take 100 mg by mouth daily as needed for mild constipation. Reported on  06/01/2015    . meloxicam (MOBIC) 15 MG tablet Take 1 tablet (15 mg total) by mouth daily. 30 tablet 5  . polyethylene glycol (MIRALAX / GLYCOLAX) packet Take 17 g by mouth daily as needed for mild constipation. Reported on 06/01/2015    . Probiotic Product (PROBIOTIC PO) Take 1 tablet by mouth every morning. Reported on 06/01/2015    . pantoprazole (PROTONIX) 40 MG tablet Take 1 tablet (40 mg total) by mouth daily. 30 tablet 3  . aspirin EC 81 MG tablet Taking every 2-3 days     No facility-administered medications prior to visit.     Allergies  Allergen Reactions  . Aspirin Nausea Only    High dose only  . Seasonal Ic [Cholestatin]     ROS As per HPI  PE: Blood pressure 130/85, pulse 68,  temperature 97.9 F (36.6 C), temperature source Oral, resp. rate 16, height 5' (1.524 m), weight 148 lb 2 oz (67.2 kg), SpO2 97 %. Body mass index is 28.93 kg/m.  Gen: Alert, well appearing.  Patient is oriented to person, place, time, and situation. AFFECT: pleasant, lucid thought and speech. No tenderness anywhere on R hip, thigh, calf, or tibia/fibula regions, or ankle or foot today. No erythema or swelling of R LE. ROM of R hip, knee, and ankle all intact w/out pain.  LABS:  Lab Results  Component Value Date   TSH 3.06 10/06/2015   Lab Results  Component Value Date   WBC 5.3 05/08/2017   HGB 14.6 05/08/2017   HCT 43.1 05/08/2017   MCV 90.1 05/08/2017   PLT 302.0 05/08/2017   Lab Results  Component Value Date   CREATININE 0.80 05/08/2017   BUN 17 05/08/2017   NA 139 05/08/2017   K 4.2 05/08/2017   CL 103 05/08/2017   CO2 31 05/08/2017   Lab Results  Component Value Date   ALT 13 05/08/2017   AST 14 05/08/2017   ALKPHOS 58 05/08/2017   BILITOT 0.7 05/08/2017   Lab Results  Component Value Date   CHOL 196 05/08/2017   Lab Results  Component Value Date   HDL 47.70 05/08/2017   Lab Results  Component Value Date   LDLCALC 128 (H) 05/08/2017   Lab Results  Component Value Date   TRIG 101.0 05/08/2017   Lab Results  Component Value Date   CHOLHDL 4 05/08/2017     IMPRESSION AND PLAN:  1) Right leg pain; unknown etiology. Gone currently.  Will do x-rays on right from hip down to/including tib/fib.  2) Diabetes screening: fasting glucose today.  3) Hx of mild/borderline hyperlipidemia: repeat FLP today.  4) Preventative health: pt got flu vaccine today.  Needs prevnar 13 at a future visit.  An After Visit Summary was printed and given to the patient.  FOLLOW UP: Return in about 6 months (around 06/24/2018) for annual CPE (fasting).  Signed:  Crissie Sickles, MD           12/23/2017

## 2017-12-24 ENCOUNTER — Ambulatory Visit (HOSPITAL_BASED_OUTPATIENT_CLINIC_OR_DEPARTMENT_OTHER)
Admission: RE | Admit: 2017-12-24 | Discharge: 2017-12-24 | Disposition: A | Discharge status: Home / Self Care | Payer: Medicare Other | Source: Ambulatory Visit | Attending: Family Medicine | Admitting: Family Medicine

## 2017-12-24 DIAGNOSIS — M79661 Pain in right lower leg: Secondary | ICD-10-CM | POA: Diagnosis not present

## 2017-12-24 DIAGNOSIS — M25551 Pain in right hip: Secondary | ICD-10-CM

## 2017-12-24 DIAGNOSIS — M79651 Pain in right thigh: Secondary | ICD-10-CM | POA: Diagnosis not present

## 2017-12-24 DIAGNOSIS — M25561 Pain in right knee: Secondary | ICD-10-CM | POA: Diagnosis not present

## 2017-12-24 DIAGNOSIS — M79604 Pain in right leg: Secondary | ICD-10-CM | POA: Insufficient documentation

## 2018-01-02 ENCOUNTER — Other Ambulatory Visit: Payer: Self-pay | Admitting: Family Medicine

## 2018-01-02 NOTE — Telephone Encounter (Signed)
RF request for meloxicam LOV: 12/23/16 Next ov: None Last written: 04/25/17 #30 w/ 5RF  Please advise. Thanks.

## 2018-02-16 ENCOUNTER — Ambulatory Visit: Payer: Self-pay | Admitting: Radiation Oncology

## 2018-03-09 ENCOUNTER — Ambulatory Visit: Payer: Self-pay

## 2018-03-09 NOTE — Telephone Encounter (Signed)
Phone call returned to pt. Reported she has had urgency and frequency of urination for approx. 2-3 weeks.  Stated there is no pain with urination.  Stated "I have a tingling in my chest when I finish urinating."  Stated "there isn't any pain."  Denied fever/ chills.  Reported she urinates in smaller amts.  Stated with the urgency, she has had incontinence when she can't get there fast enough.  Reported there is a foul odor to her urine. Denied change in color of urine.  Appt. scheduled in AM with PCP.  Care advice given per protocol.  Verb. Understanding.  Agrees with plan.            Reason for Disposition . Urinating more frequently than usual (i.e., frequency)  Answer Assessment - Initial Assessment Questions 1. SYMPTOM: "What's the main symptom you're concerned about?" (e.g., frequency, incontinence)     Urgency and frequency of urination 2. ONSET: "When did the  symptoms  start?"     About  2-3 weeks ago 3. PAIN: "Is there any pain?" If so, ask: "How bad is it?" (Scale: 1-10; mild, moderate, severe)     Denied pain ; c/o tingling in chest with urination  4. CAUSE: "What do you think is causing the symptoms?"     Urinary tract infection  5. OTHER SYMPTOMS: "Do you have any other symptoms?" (e.g., fever, flank pain, blood in urine, pain with urination)     Urgency, frequency of urination, c/o foul odor;  Denied change in color of urine; denied fever/ chills; voiding in small amts.    6. PREGNANCY: "Is there any chance you are pregnant?" "When was your last menstrual period?"     N/A  Protocols used: URINARY [facility] Message from Vernona Rieger sent at 03/09/2018 2:05 PM EST   Patient states that she has been urinating a lot but no pain, denies other symptoms. It has been going on for about 2 weeks. She would like the nurse to give her a call back

## 2018-03-10 ENCOUNTER — Encounter: Payer: Self-pay | Admitting: Family Medicine

## 2018-03-10 ENCOUNTER — Ambulatory Visit (INDEPENDENT_AMBULATORY_CARE_PROVIDER_SITE_OTHER): Payer: Medicare Other | Admitting: Family Medicine

## 2018-03-10 VITALS — BP 140/84 | HR 73 | Temp 98.0°F | Resp 16 | Ht 60.0 in | Wt 150.5 lb

## 2018-03-10 DIAGNOSIS — R350 Frequency of micturition: Secondary | ICD-10-CM

## 2018-03-10 DIAGNOSIS — N3001 Acute cystitis with hematuria: Secondary | ICD-10-CM

## 2018-03-10 LAB — POCT URINALYSIS DIPSTICK
BILIRUBIN UA: NEGATIVE
Glucose, UA: NEGATIVE
Ketones, UA: NEGATIVE
NITRITE UA: NEGATIVE
PH UA: 6 (ref 5.0–8.0)
PROTEIN UA: POSITIVE — AB
Spec Grav, UA: 1.025 (ref 1.010–1.025)
UROBILINOGEN UA: 0.2 U/dL

## 2018-03-10 MED ORDER — CIPROFLOXACIN HCL 500 MG PO TABS: 500.0000 mg | ORAL_TABLET | Freq: Two times a day (BID) | ORAL | 0 refills | Status: AC

## 2018-03-10 NOTE — Progress Notes (Signed)
OFFICE VISIT  03/10/2018   CC:  Chief Complaint  Patient presents with  . Urinary Frequency    HPI:    Patient is a 67 y.o. Caucasian female who presents for urinary complaints. Has urinary frequency and urinary urgency for last 3-4 wks, getting worse. NO dysuria, no nausea, no lower abd pain.  Some foul urinary odor.   No vag d/c, no itching.  Past Medical History:  Diagnosis Date  . Allergy   . Borderline hyperlipidemia    [place] CV risk 2017= 6%.  . Cataract   . Cerebral palsy ([place])   . Colon polyp, hyperplastic 2004; 12/2015   Recall 12/2025  . Difficulty sleeping   . Endometrial adenocarcinoma ([place]) 09/2013   Dr. Denman George: pt got vaginal brachytherapy via rad onc.  No sign of dz recurrence as of 02/2016 Gyn Onc f/u.  Next f/u with them is 6 mo.  . Morton's neuroma 2012   Dr. Roseanne Reno did injections in the past (left foot)  . Osteoarthritis    LB, HIPs, knees (L knee end stage), hands  . Osteopenia    DEXA 10/2017 was -1.5.  Repeat 10/2019.  . Radiation 12/16/13, 12/22/13, 12/30/13, 01/06/14, 01/13/14   proximal vagina 30 gray  . Seasonal allergic rhinitis     Past Surgical History:  Procedure Laterality Date  . COLONOSCOPY  09/2002; 12/2015   2004 hyperplastic; recall sent 2009 but pt apparently didn't respond.  12/2015: one sessile serrated polyp w/out cytologic atypia: recall 10 yrs per GI.  Marland Kitchen DEXA  08/2007; 11/14/15; 10/2017   2009 T score -1.2.  2017 T score -1.6.  2019 T score -1.5.  Plan recheck DEXA 10/2019.  Marland Kitchen DILATION AND CURETTAGE OF UTERUS    . LEG SURGERY Left 01/2014   "cleaned out cartilage"  . ROBOTIC ASSISTED TOTAL HYSTERECTOMY WITH BILATERAL SALPINGO OOPHERECTOMY Bilateral 10/05/2013   Procedure: ROBOTIC ASSISTED TOTAL HYSTERECTOMY WITH BILATERAL SALPINGO OOPHORECTOMY WITH LYMPH NODE DISECTION;  Surgeon: Everitt Amber, MD;  Location: [facility];  Service: Gynecology;  Laterality: Bilateral;  . TONSILLECTOMY    . TOTAL KNEE ARTHROPLASTY Left 12/07/2014   Procedure: TOTAL KNEE ARTHROPLASTY;  Surgeon: Latanya Maudlin, MD;  Location: [facility];  Service: Orthopedics;  Laterality: Left;  . TUBAL LIGATION      Outpatient Medications Prior to Visit  Medication Sig Dispense Refill  . beta carotene w/minerals (OCUVITE) tablet Take 1 tablet by mouth every morning. Reported on 06/01/2015    . CALCIUM PO Take 1 tablet by mouth daily.    . cetirizine (ZYRTEC) 10 MG tablet Take 10 mg by mouth daily as needed for allergies. Reported on 06/01/2015    . Cholecalciferol (VITAMIN D PO) Take 1 tablet by mouth daily.    . cyclobenzaprine (FLEXERIL) 10 MG tablet Take 1 tablet (10 mg total) by mouth 3 (three) times daily as needed for muscle spasms. 30 tablet 0  . docusate sodium (COLACE) 100 MG capsule Take 100 mg by mouth daily as needed for mild constipation. Reported on 06/01/2015    . meloxicam (MOBIC) 15 MG tablet TAKE ONE TABLET BY MOUTH DAILY 30 tablet 5  . pantoprazole (PROTONIX) 40 MG tablet Take 1 tablet (40 mg total) by mouth daily. 90 tablet 1  . polyethylene glycol (MIRALAX / GLYCOLAX) packet Take 17 g by mouth daily as needed for mild constipation. Reported on 06/01/2015    . Probiotic Product (PROBIOTIC PO) Take 1 tablet by mouth every morning. Reported on 06/01/2015     No  facility-administered medications prior to visit.     Allergies  Allergen Reactions  . Aspirin Nausea Only    High dose only  . Seasonal Ic [Cholestatin]     ROS As per HPI  PE: Blood pressure 140/84, pulse 73, temperature 98 F (36.7 C), temperature source Oral, resp. rate 16, height 5' (1.524 m), weight 150 lb 8 oz (68.3 kg), SpO2 96 %. Body mass index is 29.39 kg/m.  Gen: Alert, well appearing.  Patient is oriented to person, place, time, and situation. AFFECT: pleasant, lucid thought and speech. CV: RRR, no m/r/g.   LUNGS: CTA bilat, nonlabored resps, good aeration in all lung fields. CVA--no tenderness.  LABS:    Chemistry      Component Value Date/Time   NA 139  05/08/2017 1131   K 4.2 05/08/2017 1131   CL 103 05/08/2017 1131   CO2 31 05/08/2017 1131   BUN 17 05/08/2017 1131   CREATININE 0.80 05/08/2017 1131   CREATININE 0.96 09/10/2013 1159      Component Value Date/Time   CALCIUM 9.8 05/08/2017 1131   ALKPHOS 58 05/08/2017 1131   AST 14 05/08/2017 1131   ALT 13 05/08/2017 1131   BILITOT 0.7 05/08/2017 1131     POC CC UA: trace blood, small LEUKs, + protein, o/w normal.  IMPRESSION AND PLAN:  Acute cystitis with microhematuria. Send urine for c/s. Cipro 500 mg bid x 5d. Signs/symptoms to call or return for were reviewed and pt expressed understanding.  An After Visit Summary was printed and given to the patient.  FOLLOW UP: Return if symptoms worsen or fail to improve.  Signed:  Crissie Sickles, MD           03/10/2018

## 2018-03-12 ENCOUNTER — Encounter: Payer: Self-pay | Admitting: Family Medicine

## 2018-03-12 ENCOUNTER — Encounter: Payer: Self-pay | Admitting: *Deleted

## 2018-03-12 LAB — URINE CULTURE
MICRO NUMBER: 91510310
SPECIMEN QUALITY:: ADEQUATE

## 2018-05-18 ENCOUNTER — Ambulatory Visit
Admission: RE | Admit: 2018-05-18 | Discharge: 2018-05-18 | Disposition: A | Discharge status: Home / Self Care | Payer: Medicare Other | Source: Ambulatory Visit | Attending: Radiation Oncology | Admitting: Radiation Oncology

## 2018-05-18 ENCOUNTER — Other Ambulatory Visit: Payer: Self-pay

## 2018-05-18 ENCOUNTER — Encounter: Payer: Self-pay | Admitting: Radiation Oncology

## 2018-05-18 VITALS — BP 116/71 | HR 72 | Temp 98.2°F | Resp 20 | Ht 60.0 in | Wt 148.8 lb

## 2018-05-18 DIAGNOSIS — C541 Malignant neoplasm of endometrium: Secondary | ICD-10-CM | POA: Insufficient documentation

## 2018-05-18 DIAGNOSIS — Z923 Personal history of irradiation: Secondary | ICD-10-CM | POA: Diagnosis not present

## 2018-05-18 DIAGNOSIS — Z08 Encounter for follow-up examination after completed treatment for malignant neoplasm: Secondary | ICD-10-CM | POA: Diagnosis not present

## 2018-05-18 NOTE — Progress Notes (Signed)
Radiation Oncology         (336) [phone] ________________________________  Name: Kelsey Bruce MRN: [id number]  Date: 05/18/2018  DOB: 02-07-1951  Follow-Up Visit Note  CC: McGowen, Adrian Blackwater, MD  Everitt Amber, MD    ICD-10-CM   1. Endometrial cancer, grade I ([place]) C54.1     Diagnosis:   68 y.o. female with Stage IB Grade 2 Endometrioid Adenocarcinoma  Interval Since Last Radiation:  4 years, 4 months   12/16/2013 - 01/13/2014: Proximal vagina / 30 Gy in 5 fractions  Narrative:  The patient returns today for routine follow-up.  She denies any new medical problems since her last visit.   She reports chronic pain in her knees and lower back. She had several X-rays in October of her lower extremities but these concluded no fracture or dislocation, and no evident arthropathy.  On review of systems, she reports chronic urinary incontinence and bloating. She reports nausea after taking oral medication. She denies vaginal and rectal bleeding and any other symptoms.    ALLERGIES:  is allergic to aspirin and seasonal ic [cholestatin].  Meds: Current Outpatient Medications  Medication Sig Dispense Refill  . aspirin 81 MG tablet Take 81 mg by mouth 2 (two) times a week.    . beta carotene w/minerals (OCUVITE) tablet Take 1 tablet by mouth every morning. Reported on 06/01/2015    . CALCIUM PO Take 1 tablet by mouth daily.    . cetirizine (ZYRTEC) 10 MG tablet Take 10 mg by mouth daily as needed for allergies. Reported on 06/01/2015    . Cholecalciferol (VITAMIN D PO) Take 1 tablet by mouth daily.    . cyclobenzaprine (FLEXERIL) 10 MG tablet Take 1 tablet (10 mg total) by mouth 3 (three) times daily as needed for muscle spasms. 30 tablet 0  . docusate sodium (COLACE) 100 MG capsule Take 100 mg by mouth daily as needed for mild constipation. Reported on 06/01/2015    . meloxicam (MOBIC) 15 MG tablet TAKE ONE TABLET BY MOUTH DAILY 30 tablet 5  . pantoprazole (PROTONIX) 40 MG tablet Take 1 tablet  (40 mg total) by mouth daily. 90 tablet 1  . polyethylene glycol (MIRALAX / GLYCOLAX) packet Take 17 g by mouth daily as needed for mild constipation. Reported on 06/01/2015    . Probiotic Product (PROBIOTIC PO) Take 1 tablet by mouth every morning. Reported on 06/01/2015     No current facility-administered medications for this encounter.     Physical Findings: The patient is in no acute distress. Patient is alert and oriented.  height is 5' (1.524 m) and weight is 148 lb 12.8 oz (67.5 kg). Her oral temperature is 98.2 F (36.8 C). Her blood pressure is 116/71 and her pulse is 72. Her respiration is 20 and oxygen saturation is 99%.   Lungs are clear to auscultation bilaterally. Heart has regular rate and rhythm. No palpable cervical, supraclavicular, or axillary adenopathy. Abdomen soft, non-tender, normal bowel sounds.  On pelvic examination the external genitalia were unremarkable. A speculum exam was performed. There are no mucosal lesions noted in the vaginal vault.  On bimanual examination there were no pelvic masses appreciated.   Lab Findings: Lab Results  Component Value Date   WBC 5.3 05/08/2017   HGB 14.6 05/08/2017   HCT 43.1 05/08/2017   MCV 90.1 05/08/2017   PLT 302.0 05/08/2017    Radiographic Findings: No results found.  Impression:  Stage IB Grade 2 Endometrioid Adenocarcinoma No evidence of recurrence clinical  exam.   Plan:  She will follow-up in radiation oncology in 6 months.   ____________________________________  Blair Promise, PhD, MD  This document serves as a record of services personally performed by Gery Pray, MD. It was created on his behalf by Mary-Margaret Loma Messing, a trained medical scribe. The creation of this record is based on the scribe's personal observations and the provider's statements to them. This document has been checked and approved by the attending provider.

## 2018-05-18 NOTE — Progress Notes (Signed)
Pt presents today for f/u with Dr. Sondra Come. Pt denies c/o pain in pelvic area. Pt reports a dryness in perineal area and that some days pt reports an odor. Pt denies dysuria/hematuria. Pt reports urinary incontinence and wears a pad daily. Pt reports changing pads frequently. Pt denies vaginal itching, bleeding, discharge. Pt reports constipation, which is relieved by laxatives. Pt reports abdominal bloating. Pt reports occasional nausea.   BP 116/71 (BP Location: Left Arm, Patient Position: Sitting)   Pulse 72   Temp 98.2 F (36.8 C) (Oral)   Resp 20   Ht 5' (1.524 m)   Wt 148 lb 12.8 oz (67.5 kg)   SpO2 99%   BMI 29.06 kg/m    Wt Readings from Last 3 Encounters:  05/18/18 148 lb 12.8 oz (67.5 kg)  03/10/18 150 lb 8 oz (68.3 kg)  12/23/17 148 lb 2 oz (67.2 kg)   Loma Sousa, RN BSN

## 2018-07-07 ENCOUNTER — Ambulatory Visit: Payer: Self-pay | Admitting: *Deleted

## 2018-07-07 NOTE — Telephone Encounter (Signed)
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 She has been diagnosed by symptoms because she is not severe enough to be admitted to hospital.  Patient states she had fever yesterday afternoon of - 98.2- within 2 hours 101.2- red face, aches and pains.Patient took tylenol and she states her fever has gone down. She does report a really bad headache. Patient reports her symptoms are better today. Reviewed COVID-19 protocols and patient is aware. Call to office to schedule a virtual visit with PCP.  Reason for Disposition . [1] Fever (or feeling feverish) OR cough AND [2] within 14 Days of COVID-19 EXPOSURE (Close Contact)  Answer Assessment - Initial Assessment Questions 1. 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 PLACE of CONTACT: "Where were you when you were exposed to COVID-19?" (e.g., home, school, medical waiting room; which city?)     Patient's home 3. TYPE of CONTACT: "How much contact was there?" (e.g., sitting next to, live in same house, work in same office, same building)     15 hours continuous care 4. DURATION of CONTACT: "How long were you in contact with the COVID-19 patient?" (e.g., a few seconds, passed by person, a few minutes, live with the patient)     Hours- person to person care 5. DATE of CONTACT: "When did you have contact with a COVID-19 patient?" (e.g., how many days ago)     4/12 6. TRAVEL: "Have you traveled out of the country recently?" If so, "When and where?"     * Also ask about out-of-state travel, since the CDC has identified some high risk cities for community spread in the [place].     * Note: Travel becomes less relevant if there is widespread community transmission where the patient lives.     No travel 7. COMMUNITY SPREAD: "Are there lots of cases or COVID-19 (community spread) where you  live?" (See public health department website, if unsure)   * MAJOR community spread: high number of cases; numbers of cases are increasing; many people hospitalized.   * MINOR community spread: low number of cases; not increasing; few or no people hospitalized     Community spread 8. SYMPTOMS: "Do you have any symptoms?" (e.g., fever, cough, breathing difficulty)     Aches and pains, headache, fever- off/on 9. PREGNANCY OR POSTPARTUM: "Is there any chance you are pregnant?" "When was your last menstrual period?" "Did you deliver in the last 2 weeks?"     n/a 10. HIGH RISK: "Do you have any heart or lung problems? Do you have a weak immune system?" (e.g., CHF, COPD, asthma, HIV positive, chemotherapy, renal failure, diabetes mellitus, sickle cell anemia)       no  Protocols used: CORONAVIRUS (COVID-19) EXPOSURE-A-AH

## 2018-07-08 ENCOUNTER — Ambulatory Visit: Payer: Medicare Other | Admitting: Family Medicine

## 2018-07-08 NOTE — Progress Notes (Signed)
Virtual Visit via Video Note  I connected with pt  on 07/08/18 at 10:00 AM EDT by a video enabled telemedicine application and verified that I am speaking with the correct person using two identifiers.  Location patient: home Location provider:work or home office Persons participating in the virtual visit: patient, provider  I discussed the limitations of evaluation and management by telemedicine and the availability of in person appointments. The patient expressed understanding and agreed to proceed.  Telemedicine visit is a necessity given the COVID-19 restrictions in place at the current time.  HPI: [name] being seen for headache. Onset 4 d/a, temp up to 101.2, HA off and on, scratchy throat, slight cough.  Some fatigue.  No SOB or DOE. No wheezing.   Appetite is poor but no n/v/d. Trying to drink lots of fluids.   Onset yesterday morning, says she feels some urinary urgency, but doesn't go much when she urinates.   No dysuria.  Urine has not smelled or looked different.  Has been exposed to a person with COVID-like illness in the days prior to starting to feels sick.    ROS: See pertinent positives and negatives per HPI.  Past Medical History:  Diagnosis Date  . Allergy   . Borderline hyperlipidemia    [place] CV risk 2017= 6%.  . Cataract   . Cerebral palsy ([place])   . Colon polyp, hyperplastic 2004; 12/2015   Recall 12/2025  . Difficulty sleeping   . Endometrial adenocarcinoma ([place]) 09/2013   Dr. Denman George: pt got vaginal brachytherapy via rad onc.  No sign of dz recurrence as of 02/2016 Gyn Onc f/u.  Next f/u with them is 6 mo.  . Morton's neuroma 2012   Dr. Roseanne Reno did injections in the past (left foot)  . Osteoarthritis    LB, HIPs, knees (L knee end stage), hands  . Osteopenia    DEXA 10/2017 was -1.5.  Repeat 10/2019.  . Radiation 12/16/13, 12/22/13, 12/30/13, 01/06/14, 01/13/14   proximal vagina 30 gray  . Seasonal allergic rhinitis     Past Surgical History:   Procedure Laterality Date  . COLONOSCOPY  09/2002; 12/2015   2004 hyperplastic; recall sent 2009 but pt apparently didn't respond.  12/2015: one sessile serrated polyp w/out cytologic atypia: recall 10 yrs per GI.  Marland Kitchen DEXA  08/2007; 11/14/15; 10/2017   2009 T score -1.2.  2017 T score -1.6.  2019 T score -1.5.  Plan recheck DEXA 10/2019.  Marland Kitchen DILATION AND CURETTAGE OF UTERUS    . LEG SURGERY Left 01/2014   "cleaned out cartilage"  . ROBOTIC ASSISTED TOTAL HYSTERECTOMY WITH BILATERAL SALPINGO OOPHERECTOMY Bilateral 10/05/2013   Procedure: ROBOTIC ASSISTED TOTAL HYSTERECTOMY WITH BILATERAL SALPINGO OOPHORECTOMY WITH LYMPH NODE DISECTION;  Surgeon: Everitt Amber, MD;  Location: [facility];  Service: Gynecology;  Laterality: Bilateral;  . TONSILLECTOMY    . TOTAL KNEE ARTHROPLASTY Left 12/07/2014   Procedure: TOTAL KNEE ARTHROPLASTY;  Surgeon: Latanya Maudlin, MD;  Location: [facility];  Service: Orthopedics;  Laterality: Left;  . TUBAL LIGATION      Family History  Problem Relation Age of Onset  . Heart disease Father   . Hypertension Father   . Cancer Father        lung  . Arthritis Father   . Alcohol abuse Father   . Heart disease Mother   . Cancer Mother        lung  . Deep vein thrombosis Mother   . Alcohol abuse Mother   .  Heart disease Brother   . Hypertension Brother   . Heart disease Brother   . Hypertension Brother   . Heart disease Brother   . Hypertension Brother   . Stomach cancer Maternal Grandfather   . Colon cancer Neg Hx   . Esophageal cancer Neg Hx   . Rectal cancer Neg Hx      Current Outpatient Medications:  .  aspirin 81 MG tablet, Take 81 mg by mouth 2 (two) times a week., Disp: , Rfl:  .  beta carotene w/minerals (OCUVITE) tablet, Take 1 tablet by mouth every morning. Reported on 06/01/2015, Disp: , Rfl:  .  CALCIUM PO, Take 1 tablet by mouth daily., Disp: , Rfl:  .  cetirizine (ZYRTEC) 10 MG tablet, Take 10 mg by mouth daily as needed for allergies. Reported on 06/01/2015,  Disp: , Rfl:  .  Cholecalciferol (VITAMIN D PO), Take 1 tablet by mouth daily., Disp: , Rfl:  .  cyclobenzaprine (FLEXERIL) 10 MG tablet, Take 1 tablet (10 mg total) by mouth 3 (three) times daily as needed for muscle spasms., Disp: 30 tablet, Rfl: 0 .  docusate sodium (COLACE) 100 MG capsule, Take 100 mg by mouth daily as needed for mild constipation. Reported on 06/01/2015, Disp: , Rfl:  .  meloxicam (MOBIC) 15 MG tablet, TAKE ONE TABLET BY MOUTH DAILY, Disp: 30 tablet, Rfl: 5 .  pantoprazole (PROTONIX) 40 MG tablet, Take 1 tablet (40 mg total) by mouth daily., Disp: 90 tablet, Rfl: 1 .  polyethylene glycol (MIRALAX / GLYCOLAX) packet, Take 17 g by mouth daily as needed for mild constipation. Reported on 06/01/2015, Disp: , Rfl:  .  Probiotic Product (PROBIOTIC PO), Take 1 tablet by mouth every morning. Reported on 06/01/2015, Disp: , Rfl:   EXAM:  VITALS per patient if applicable: Temp 40.9 F (37.3 C) (Oral)    GENERAL: alert, oriented, appears well and in no acute distress  HEENT: atraumatic, conjunttiva clear, no obvious abnormalities on inspection of external nose and ears  NECK: normal movements of the head and neck  LUNGS: on inspection no signs of respiratory distress, breathing rate appears normal, no obvious gross SOB, gasping or wheezing  CV: no obvious cyanosis  MS: moves all visible extremities without noticeable abnormality  PSYCH/NEURO: pleasant and cooperative, no obvious depression or anxiety, speech and thought processing grossly intact  LABS: none today    Chemistry      Component Value Date/Time   NA 139 05/08/2017 1131   K 4.2 05/08/2017 1131   CL 103 05/08/2017 1131   CO2 31 05/08/2017 1131   BUN 17 05/08/2017 1131   CREATININE 0.80 05/08/2017 1131   CREATININE 0.96 09/10/2013 1159      Component Value Date/Time   CALCIUM 9.8 05/08/2017 1131   ALKPHOS 58 05/08/2017 1131   AST 14 05/08/2017 1131   ALT 13 05/08/2017 1131   BILITOT 0.7 05/08/2017 1131       ASSESSMENT AND PLAN:  Discussed the following assessment and plan:  1) viral resp illness: no sign of any resp distress. Exposed to pt with suspected COVID--test neg (?) pt not sure about this info. Signs/symptoms to call or return or go to ED for were reviewed and pt expressed understanding. Symptomatic care discussed.  2) Acute UTI suspected. Will start cipro 500 mg bid x 3d. If not improved signif with this then we'll have her come in for lab to get UA and send for c/s.  I discussed the assessment and  treatment plan with the patient. The patient was provided an opportunity to ask questions and all were answered. The patient agreed with the plan and demonstrated an understanding of the instructions.   The patient was advised to call back or seek an in-person evaluation if the symptoms worsen or if the condition fails to improve as anticipated.  F/u: if not improving.  Signed:  Crissie Sickles, MD           07/08/2018

## 2018-07-09 ENCOUNTER — Encounter: Payer: Self-pay | Admitting: Family Medicine

## 2018-07-09 ENCOUNTER — Other Ambulatory Visit: Payer: Self-pay

## 2018-07-09 ENCOUNTER — Ambulatory Visit (INDEPENDENT_AMBULATORY_CARE_PROVIDER_SITE_OTHER): Payer: Medicare Other | Admitting: Family Medicine

## 2018-07-09 VITALS — Temp 99.1°F

## 2018-07-09 DIAGNOSIS — B9789 Other viral agents as the cause of diseases classified elsewhere: Secondary | ICD-10-CM | POA: Diagnosis not present

## 2018-07-09 DIAGNOSIS — J069 Acute upper respiratory infection, unspecified: Secondary | ICD-10-CM

## 2018-07-09 DIAGNOSIS — N390 Urinary tract infection, site not specified: Secondary | ICD-10-CM | POA: Diagnosis not present

## 2018-07-09 DIAGNOSIS — R509 Fever, unspecified: Secondary | ICD-10-CM

## 2018-07-09 MED ORDER — CIPROFLOXACIN HCL 500 MG PO TABS: 500.0000 mg | ORAL_TABLET | Freq: Two times a day (BID) | ORAL | 0 refills | Status: AC

## 2018-08-14 ENCOUNTER — Other Ambulatory Visit: Payer: Self-pay | Admitting: Family Medicine

## 2018-08-14 DIAGNOSIS — Z1231 Encounter for screening mammogram for malignant neoplasm of breast: Secondary | ICD-10-CM

## 2018-08-21 ENCOUNTER — Ambulatory Visit (INDEPENDENT_AMBULATORY_CARE_PROVIDER_SITE_OTHER): Payer: Medicare Other | Admitting: Family Medicine

## 2018-08-21 ENCOUNTER — Other Ambulatory Visit: Payer: Self-pay

## 2018-08-21 ENCOUNTER — Encounter: Payer: Self-pay | Admitting: Family Medicine

## 2018-08-21 ENCOUNTER — Ambulatory Visit (HOSPITAL_BASED_OUTPATIENT_CLINIC_OR_DEPARTMENT_OTHER)
Admission: RE | Admit: 2018-08-21 | Discharge: 2018-08-21 | Disposition: A | Discharge status: Home / Self Care | Payer: Medicare Other | Source: Ambulatory Visit | Attending: Family Medicine | Admitting: Family Medicine

## 2018-08-21 DIAGNOSIS — M545 Low back pain: Secondary | ICD-10-CM | POA: Diagnosis not present

## 2018-08-21 DIAGNOSIS — M5442 Lumbago with sciatica, left side: Secondary | ICD-10-CM | POA: Insufficient documentation

## 2018-08-21 MED ORDER — TRAMADOL HCL 50 MG PO TABS: ORAL_TABLET | ORAL | 0 refills | Status: DC

## 2018-08-21 NOTE — Patient Instructions (Signed)
Low Back Sprain Rehab  Ask your health care provider which exercises are safe for you. Do exercises exactly as told by your health care provider and adjust them as directed. It is normal to feel mild stretching, pulling, tightness, or discomfort as you do these exercises, but you should stop right away if you feel sudden pain or your pain gets worse. Do not begin these exercises until told by your health care provider.  Stretching and range of motion exercises  These exercises warm up your muscles and joints and improve the movement and flexibility of your back. These exercises also help to relieve pain, numbness, and tingling.  Exercise A: Lumbar rotation    1. Lie on your back on a firm surface and bend your knees.  2. Straighten your arms out to your sides so each arm forms an "L" shape with a side of your body (a 90 degree angle).  3. Slowly move both of your knees to one side of your body until you feel a stretch in your lower back. Try not to let your shoulders move off of the floor.  4. Hold for __________ seconds.  5. Tense your abdominal muscles and slowly move your knees back to the starting position.  6. Repeat this exercise on the other side of your body.  Repeat __________ times. Complete this exercise __________ times a day.  Exercise B: Prone extension on elbows    1. Lie on your abdomen on a firm surface.  2. Prop yourself up on your elbows.  3. Use your arms to help lift your chest up until you feel a gentle stretch in your abdomen and your lower back.  ? This will place some of your body weight on your elbows. If this is uncomfortable, try stacking pillows under your chest.  ? Your hips should stay down, against the surface that you are lying on. Keep your hip and back muscles relaxed.  4. Hold for __________ seconds.  5. Slowly relax your upper body and return to the starting position.  Repeat __________ times. Complete this exercise __________ times a day.  Strengthening exercises  These  exercises build strength and endurance in your back. Endurance is the ability to use your muscles for a long time, even after they get tired.  Exercise C: Pelvic tilt  1. Lie on your back on a firm surface. Bend your knees and keep your feet flat.  2. Tense your abdominal muscles. Tip your pelvis up toward the ceiling and flatten your lower back into the floor.  ? To help with this exercise, you may place a small towel under your lower back and try to push your back into the towel.  3. Hold for __________ seconds.  4. Let your muscles relax completely before you repeat this exercise.  Repeat __________ times. Complete this exercise __________ times a day.  Exercise D: Alternating arm and leg raises    1. Get on your hands and knees on a firm surface. If you are on a hard floor, you may want to use padding to cushion your knees, such as an exercise mat.  2. Line up your arms and legs. Your hands should be below your shoulders, and your knees should be below your hips.  3. Lift your left leg behind you. At the same time, raise your right arm and straighten it in front of you.  ? Do not lift your leg higher than your hip.  ? Do not lift your arm   higher than your shoulder.  ? Keep your abdominal and back muscles tight.  ? Keep your hips facing the ground.  ? Do not arch your back.  ? Keep your balance carefully, and do not hold your breath.  4. Hold for __________ seconds.  5. Slowly return to the starting position and repeat with your right leg and your left arm.  Repeat __________ times. Complete this exercise __________ times a day.  Exercise E: Abdominal set with straight leg raise    1. Lie on your back on a firm surface.  2. Bend one of your knees and keep your other leg straight.  3. Tense your abdominal muscles and lift your straight leg up, 4-6 inches (10-15 cm) off the ground.  4. Keep your abdominal muscles tight and hold for __________ seconds.  ? Do not hold your breath.  ? Do not arch your back. Keep it  flat against the ground.  5. Keep your abdominal muscles tense as you slowly lower your leg back to the starting position.  6. Repeat with your other leg.  Repeat __________ times. Complete this exercise __________ times a day.  Posture and body mechanics    Body mechanics refers to the movements and positions of your body while you do your daily activities. Posture is part of body mechanics. Good posture and healthy body mechanics can help to relieve stress in your body's tissues and joints. Good posture means that your spine is in its natural S-curve position (your spine is neutral), your shoulders are pulled back slightly, and your head is not tipped forward. The following are general guidelines for applying improved posture and body mechanics to your everyday activities.  Standing    · When standing, keep your spine neutral and your feet about hip-width apart. Keep a slight bend in your knees. Your ears, shoulders, and hips should line up.  · When you do a task in which you stand in one place for a long time, place one foot up on a stable object that is 2-4 inches (5-10 cm) high, such as a footstool. This helps keep your spine neutral.  Sitting    · When sitting, keep your spine neutral and keep your feet flat on the floor. Use a footrest, if necessary, and keep your thighs parallel to the floor. Avoid rounding your shoulders, and avoid tilting your head forward.  · When working at a desk or a computer, keep your desk at a height where your hands are slightly lower than your elbows. Slide your chair under your desk so you are close enough to maintain good posture.  · When working at a computer, place your monitor at a height where you are looking straight ahead and you do not have to tilt your head forward or downward to look at the screen.  Resting    · When lying down and resting, avoid positions that are most painful for you.  · If you have pain with activities such as sitting, bending, stooping, or squatting  (flexion-based activities), lie in a position in which your body does not bend very much. For example, avoid curling up on your side with your arms and knees near your chest (fetal position).  · If you have pain with activities such as standing for a long time or reaching with your arms (extension-based activities), lie with your spine in a neutral position and bend your knees slightly. Try the following positions:  · Lying on your side with a   pillow between your knees.  · Lying on your back with a pillow under your knees.  Lifting    · When lifting objects, keep your feet at least shoulder-width apart and tighten your abdominal muscles.  · Bend your knees and hips and keep your spine neutral. It is important to lift using the strength of your legs, not your back. Do not lock your knees straight out.  · Always ask for help to lift heavy or awkward objects.  This information is not intended to replace advice given to you by your health care provider. Make sure you discuss any questions you have with your health care provider.  Document Released: 03/11/2005 Document Revised: 11/16/2015 Document Reviewed: 12/21/2014  [employer] Interactive Patient Education © 2019 [employer]

## 2018-08-21 NOTE — Progress Notes (Signed)
Virtual Visit via Video Note  I connected with pt on 08/21/18 at 10:40 AM EDT by a video enabled telemedicine application and verified that I am speaking with the correct person using two identifiers.  Location patient: home Location provider:work or home office Persons participating in the virtual visit: patient, provider  I discussed the limitations of evaluation and management by telemedicine and the availability of in person appointments. The patient expressed understanding and agreed to proceed.  Telemedicine visit is a necessity given the COVID-19 restrictions in place at the current time.  HPI: [name] being seen today for back pain and left leg pain. Onset in diffuse low back 3-4 weeks ago. Then moved to left low back area only, with recent involvement of pain radiating down back/side of left leg from hip to ankle level. No numbness or tingling in the leg.  Some weakness in L leg intermittently, mild, assoc with severe pain episodes.  No b/b dysfunction.  No saddle anesthesia. The pain is constant.  Hurts worse getting up and walking.  Relieved somewhat by sitting and propping L leg up.  No injury or strain recalled. Cyclobenzaprine helped her get rest last night. Takes meloxicam daily but stopped it lately in favor of use of aleve (sparingly) b/c aleve helps this pain better.  No knee pain. She has hx of left TKA in 2016.  ROS: no CP, no SOB, no wheezing, no cough, no dizziness, no HAs, no rashes, no melena/hematochezia.  No polyuria or polydipsia.  No myalgias or arthralgias.   Past Medical History:  Diagnosis Date  . Allergy   . Borderline hyperlipidemia    [place] CV risk 2017= 6%.  . Cataract   . Cerebral palsy ([place])   . Colon polyp, hyperplastic 2004; 12/2015   Recall 12/2025  . Difficulty sleeping   . Endometrial adenocarcinoma ([place]) 09/2013   Dr. Denman Bruce: pt got vaginal brachytherapy via rad onc.  No sign of dz recurrence as of 02/2016 Gyn Onc f/u.  Next f/u  with them is 6 mo.  . Morton's neuroma 2012   Dr. Roseanne Bruce did injections in the past (left foot)  . Osteoarthritis    LB, HIPs, knees (L knee end stage), hands  . Osteopenia    DEXA 10/2017 was -1.5.  Repeat 10/2019.  . Radiation 12/16/13, 12/22/13, 12/30/13, 01/06/14, 01/13/14   proximal vagina 30 gray  . Seasonal allergic rhinitis     Past Surgical History:  Procedure Laterality Date  . COLONOSCOPY  09/2002; 12/2015   2004 hyperplastic; recall sent 2009 but pt apparently didn't respond.  12/2015: one sessile serrated polyp w/out cytologic atypia: recall 10 yrs per GI.  Marland Kitchen DEXA  08/2007; 11/14/15; 10/2017   2009 T score -1.2.  2017 T score -1.6.  2019 T score -1.5.  Plan recheck DEXA 10/2019.  Marland Kitchen DILATION AND CURETTAGE OF UTERUS    . LEG SURGERY Left 01/2014   "cleaned out cartilage"  . ROBOTIC ASSISTED TOTAL HYSTERECTOMY WITH BILATERAL SALPINGO OOPHERECTOMY Bilateral 10/05/2013   Procedure: ROBOTIC ASSISTED TOTAL HYSTERECTOMY WITH BILATERAL SALPINGO OOPHORECTOMY WITH LYMPH NODE DISECTION;  Surgeon: Kelsey Amber, MD;  Location: [facility];  Service: Gynecology;  Laterality: Bilateral;  . TONSILLECTOMY    . TOTAL KNEE ARTHROPLASTY Left 12/07/2014   Procedure: TOTAL KNEE ARTHROPLASTY;  Surgeon: Kelsey Maudlin, MD;  Location: [facility];  Service: Orthopedics;  Laterality: Left;  . TUBAL LIGATION      Family History  Problem Relation Age of Onset  . Heart disease  Father   . Hypertension Father   . Cancer Father        lung  . Arthritis Father   . Alcohol abuse Father   . Heart disease Mother   . Cancer Mother        lung  . Deep vein thrombosis Mother   . Alcohol abuse Mother   . Heart disease Brother   . Hypertension Brother   . Heart disease Brother   . Hypertension Brother   . Heart disease Brother   . Hypertension Brother   . Stomach cancer Maternal Grandfather   . Colon cancer Neg Hx   . Esophageal cancer Neg Hx   . Rectal cancer Neg Hx      Current Outpatient Medications:  .   aspirin 81 MG tablet, Take 81 mg by mouth 2 (two) times a week., Disp: , Rfl:  .  beta carotene w/minerals (OCUVITE) tablet, Take 1 tablet by mouth every morning. Reported on 06/01/2015, Disp: , Rfl:  .  CALCIUM PO, Take 1 tablet by mouth daily., Disp: , Rfl:  .  cetirizine (ZYRTEC) 10 MG tablet, Take 10 mg by mouth daily as needed for allergies. Reported on 06/01/2015, Disp: , Rfl:  .  Cholecalciferol (VITAMIN D PO), Take 1 tablet by mouth daily., Disp: , Rfl:  .  CRANBERRY PO, Take 4,200 mg by mouth. Take 2 capsules daily., Disp: , Rfl:  .  cyclobenzaprine (FLEXERIL) 10 MG tablet, Take 1 tablet (10 mg total) by mouth 3 (three) times daily as needed for muscle spasms., Disp: 30 tablet, Rfl: 0 .  docusate sodium (COLACE) 100 MG capsule, Take 100 mg by mouth daily as needed for mild constipation. Reported on 06/01/2015, Disp: , Rfl:  .  meloxicam (MOBIC) 15 MG tablet, TAKE ONE TABLET BY MOUTH DAILY, Disp: 30 tablet, Rfl: 5 .  pantoprazole (PROTONIX) 40 MG tablet, Take 1 tablet (40 mg total) by mouth daily., Disp: 90 tablet, Rfl: 1 .  polyethylene glycol (MIRALAX / GLYCOLAX) packet, Take 17 g by mouth daily as needed for mild constipation. Reported on 06/01/2015, Disp: , Rfl:  .  Probiotic Product (PROBIOTIC PO), Take 1 tablet by mouth every morning. Reported on 06/01/2015, Disp: , Rfl:   EXAM:  VITALS per patient if applicable: There were no vitals taken for this visit.   GENERAL: alert, oriented, appears well and in no acute distress  HEENT: atraumatic, conjunttiva clear, no obvious abnormalities on inspection of external nose and ears  NECK: normal movements of the head and neck  LUNGS: on inspection no signs of respiratory distress, breathing rate appears normal, no obvious gross SOB, gasping or wheezing  CV: no obvious cyanosis  MS: moves all visible extremities without noticeable abnormality  PSYCH/NEURO: pleasant and cooperative, no obvious depression or anxiety, speech and thought  processing grossly intact  LABS: none today    Chemistry      Component Value Date/Time   NA 139 05/08/2017 1131   K 4.2 05/08/2017 1131   CL 103 05/08/2017 1131   CO2 31 05/08/2017 1131   BUN 17 05/08/2017 1131   CREATININE 0.80 05/08/2017 1131   CREATININE 0.96 09/10/2013 1159      Component Value Date/Time   CALCIUM 9.8 05/08/2017 1131   ALKPHOS 58 05/08/2017 1131   AST 14 05/08/2017 1131   ALT 13 05/08/2017 1131   BILITOT 0.7 05/08/2017 1131     DG L spine 07/31/16 EXAM: LUMBAR SPINE - 2-3 VIEW  COMPARISON:  09/15/2013  CT  FINDINGS: Anterolisthesis of L4 on L5, 5 mm. This is increased since prior CT. This is related to degenerative facet disease. Disc space narrowing also present at L4-5. Diffuse degenerative facet disease throughout the lumbar spine. No fracture or malalignment. SI joints are symmetric and unremarkable.  IMPRESSION: Degenerative disc disease at L4-5. Diffuse degenerative facet disease. Grade 1 anterolisthesis of L4 on L5. No acute findings.  ASSESSMENT AND PLAN:  Discussed the following assessment and plan:  1) Acute left Lumbar radiculopathy suspected.  Less likely muscle strain or hip etiology.  Recommended PT but pt wanted to proceed only with home LB exercises at this time. Will rx tramadol to use prn pain, which she will not use within 4 hours of a dose of her cyclobenzaprine.  She will use aleve bid and NOT TAKE MELOXICAM while on this med.  She'll also take her PPI qd while on aleve regularly. Given her age and hx of malignancy, will check L spine plain films.  I discussed the assessment and treatment plan with the patient. The patient was provided an opportunity to ask questions and all were answered. The patient agreed with the plan and demonstrated an understanding of the instructions.   The patient was advised to call back or seek an in-person evaluation if the symptoms worsen or if the condition fails to improve as  anticipated.  F/u: if not improving.  Signed:  Crissie Sickles, MD           08/21/2018

## 2018-08-21 NOTE — Progress Notes (Signed)
Opened encounter for video visit on 08/21/18.

## 2018-10-02 ENCOUNTER — Other Ambulatory Visit: Payer: Self-pay

## 2018-10-02 ENCOUNTER — Ambulatory Visit
Admission: RE | Admit: 2018-10-02 | Discharge: 2018-10-02 | Disposition: A | Discharge status: Home / Self Care | Payer: Medicare Other | Source: Ambulatory Visit | Attending: Family Medicine | Admitting: Family Medicine

## 2018-10-02 DIAGNOSIS — Z1231 Encounter for screening mammogram for malignant neoplasm of breast: Secondary | ICD-10-CM

## 2018-11-16 ENCOUNTER — Other Ambulatory Visit: Payer: Self-pay

## 2018-11-16 ENCOUNTER — Ambulatory Visit
Admission: RE | Admit: 2018-11-16 | Discharge: 2018-11-16 | Disposition: A | Discharge status: Home / Self Care | Payer: Medicare Other | Source: Ambulatory Visit | Attending: Radiation Oncology | Admitting: Radiation Oncology

## 2018-11-16 ENCOUNTER — Encounter: Payer: Self-pay | Admitting: Radiation Oncology

## 2018-11-16 VITALS — BP 135/86 | HR 75 | Temp 98.6°F | Resp 18 | Ht 60.0 in | Wt 148.1 lb

## 2018-11-16 DIAGNOSIS — Z923 Personal history of irradiation: Secondary | ICD-10-CM | POA: Insufficient documentation

## 2018-11-16 DIAGNOSIS — Z7982 Long term (current) use of aspirin: Secondary | ICD-10-CM | POA: Diagnosis not present

## 2018-11-16 DIAGNOSIS — L298 Other pruritus: Secondary | ICD-10-CM | POA: Insufficient documentation

## 2018-11-16 DIAGNOSIS — M25562 Pain in left knee: Secondary | ICD-10-CM | POA: Insufficient documentation

## 2018-11-16 DIAGNOSIS — M545 Low back pain: Secondary | ICD-10-CM | POA: Insufficient documentation

## 2018-11-16 DIAGNOSIS — M25561 Pain in right knee: Secondary | ICD-10-CM | POA: Insufficient documentation

## 2018-11-16 DIAGNOSIS — G8929 Other chronic pain: Secondary | ICD-10-CM | POA: Diagnosis not present

## 2018-11-16 DIAGNOSIS — K5900 Constipation, unspecified: Secondary | ICD-10-CM | POA: Diagnosis not present

## 2018-11-16 DIAGNOSIS — Z79899 Other long term (current) drug therapy: Secondary | ICD-10-CM | POA: Insufficient documentation

## 2018-11-16 DIAGNOSIS — Z08 Encounter for follow-up examination after completed treatment for malignant neoplasm: Secondary | ICD-10-CM | POA: Diagnosis not present

## 2018-11-16 DIAGNOSIS — Z8542 Personal history of malignant neoplasm of other parts of uterus: Secondary | ICD-10-CM | POA: Insufficient documentation

## 2018-11-16 DIAGNOSIS — C541 Malignant neoplasm of endometrium: Secondary | ICD-10-CM

## 2018-11-16 NOTE — Progress Notes (Signed)
Pt presents today for f/u with Dr. Sondra Come. Pt denies c/o pain. Pt denies dysuria/hematuria. Pt denies vaginal bleeding/discharge. Pt reports occasional vaginal itching and attributes that to vaginal dryness. Pt denies rectal bleeding, diarrhea. Pt reports occasional constipation that is not new. Pt denies abdominal bloating, N/V.   BP 135/86 (BP Location: Left Arm, Patient Position: Sitting)   Pulse 75   Temp 98.6 F (37 C) (Temporal)   Resp 18   Ht 5' (1.524 m)   Wt 148 lb 2 oz (67.2 kg)   SpO2 98%   BMI 28.93 kg/m   Wt Readings from Last 3 Encounters:  11/16/18 148 lb 2 oz (67.2 kg)  05/18/18 148 lb 12.8 oz (67.5 kg)  03/10/18 150 lb 8 oz (68.3 kg)   Loma Sousa, RN BSN

## 2018-11-16 NOTE — Patient Instructions (Signed)
Coronavirus (COVID-19) Are you at risk?  Are you at risk for the Coronavirus (COVID-19)?  To be considered HIGH RISK for Coronavirus (COVID-19), you have to meet the following criteria:  . Traveled to [place], [place], [place], [place] or [place]; or in the [place] to [place], [place], [place], or [place]; and have fever, cough, and shortness of breath within the last 2 weeks of travel OR . Been in close contact with a person diagnosed with COVID-19 within the last 2 weeks and have fever, cough, and shortness of breath . IF YOU DO NOT MEET THESE CRITERIA, YOU ARE CONSIDERED LOW RISK FOR COVID-19.  What to do if you are HIGH RISK for COVID-19?  . If you are having a medical emergency, call 911. . Seek medical care right away. Before you go to a doctor's office, urgent care or emergency department, call ahead and tell them about your recent travel, contact with someone diagnosed with COVID-19, and your symptoms. You should receive instructions from your physician's office regarding next steps of care.  . When you arrive at healthcare provider, tell the healthcare staff immediately you have returned from visiting [place], [place], [place], [place] or [place]; or traveled in the [place] to [place], [place], [place], or [place]; in the last two weeks or you have been in close contact with a person diagnosed with COVID-19 in the last 2 weeks.   . Tell the health care staff about your symptoms: fever, cough and shortness of breath. . After you have been seen by a medical provider, you will be either: o Tested for (COVID-19) and discharged home on quarantine except to seek medical care if symptoms worsen, and asked to  - Stay home and avoid contact with others until you get your results (4-5 days)  - Avoid travel on public transportation if possible (such as bus, train, or airplane) or o Sent to the Emergency Department by EMS for evaluation, COVID-19 testing, and possible  admission depending on your condition and test results.  What to do if you are LOW RISK for COVID-19?  Reduce your risk of any infection by using the same precautions used for avoiding the common cold or flu:  . Wash your hands often with soap and warm water for at least 20 seconds.  If soap and water are not readily available, use an alcohol-based hand sanitizer with at least 60% alcohol.  . If coughing or sneezing, cover your mouth and nose by coughing or sneezing into the elbow areas of your shirt or coat, into a tissue or into your sleeve (not your hands). . Avoid shaking hands with others and consider head nods or verbal greetings only. . Avoid touching your eyes, nose, or mouth with unwashed hands.  . Avoid close contact with people who are sick. . Avoid places or events with large numbers of people in one location, like concerts or sporting events. . Carefully consider travel plans you have or are making. . If you are planning any travel outside or inside the [place], visit the CDC's Travelers' Health webpage for the latest health notices. . If you have some symptoms but not all symptoms, continue to monitor at home and seek medical attention if your symptoms worsen. . If you are having a medical emergency, call 911.   ADDITIONAL HEALTHCARE OPTIONS FOR PATIENTS  [facility] / e-Visit: [email or web address]         [facility]: [phone]  [facility]: [phone]                   [facility]: [phone]   

## 2018-11-16 NOTE — Progress Notes (Signed)
Radiation Oncology         [phone]) [phone] ________________________________  Name: Kelsey Bruce MRN: [id number]  Date: 11/16/2018  DOB: 11-09-1950  Follow-Up Visit Note  CC: McGowen, Adrian Blackwater, MD  Everitt Amber, MD    ICD-10-CM   1. Endometrial cancer, grade I ([place])  C54.1     Diagnosis:   68 y.o. female with Stage IB Grade 2 Endometrioid Adenocarcinoma  Interval Since Last Radiation:  4 years, 10 months  12/16/2013 - 01/13/2014: Proximal vagina / 30 Gy in 5 fractions  Narrative:  The patient returns today for routine follow-up.    Since her last visit, she underwent screening mammogram on 10/02/2018, which was negative for malignancy.  On review of systems, she reports occasional vaginal itching, which she attributes to vaginal dryness, and chronic occasional constipation. She denies dysuria or hematuria, vaginal bleeding or discharge, rectal bleeding, diarrhea, abdominal bloating, nausea or vomiting, and pain.   She denies any new medical problems since her last visit.   She reports chronic pain in her knees and lower back. She had several X-rays in October of her lower extremities but these concluded no fracture or dislocation, and no evident arthropathy.     ALLERGIES:  is allergic to aspirin and seasonal ic [cholestatin].  Meds: Current Outpatient Medications  Medication Sig Dispense Refill  . aspirin 81 MG tablet Take 81 mg by mouth 2 (two) times a week.    . beta carotene w/minerals (OCUVITE) tablet Take 1 tablet by mouth every morning. Reported on 06/01/2015    . CALCIUM PO Take 1 tablet by mouth daily.    . cetirizine (ZYRTEC) 10 MG tablet Take 10 mg by mouth daily as needed for allergies. Reported on 06/01/2015    . Cholecalciferol (VITAMIN D PO) Take 1 tablet by mouth daily.    Marland Kitchen CRANBERRY PO Take 4,200 mg by mouth. Take 2 capsules daily.    . cyclobenzaprine (FLEXERIL) 10 MG tablet Take 1 tablet (10 mg total) by mouth 3 (three) times daily as needed for muscle  spasms. 30 tablet 0  . docusate sodium (COLACE) 100 MG capsule Take 100 mg by mouth daily as needed for mild constipation. Reported on 06/01/2015    . meloxicam (MOBIC) 15 MG tablet TAKE ONE TABLET BY MOUTH DAILY 30 tablet 5  . pantoprazole (PROTONIX) 40 MG tablet Take 1 tablet (40 mg total) by mouth daily. 90 tablet 1  . polyethylene glycol (MIRALAX / GLYCOLAX) packet Take 17 g by mouth daily as needed for mild constipation. Reported on 06/01/2015    . Probiotic Product (PROBIOTIC PO) Take 1 tablet by mouth every morning. Reported on 06/01/2015    . traMADol (ULTRAM) 50 MG tablet 1-2 tabs po tid prn pain 30 tablet 0  . vitamin B-12 (CYANOCOBALAMIN) 1000 MCG tablet Take 1,000 mcg by mouth daily.     No current facility-administered medications for this encounter.     Physical Findings: The patient is in no acute distress. Patient is alert and oriented.  height is 5' (1.524 m) and weight is 148 lb 2 oz (67.2 kg). Her temporal temperature is 98.6 F (37 C). Her blood pressure is 135/86 and her pulse is 75. Her respiration is 18 and oxygen saturation is 98%.   Lungs are clear to auscultation bilaterally. Heart has regular rate and rhythm. No palpable cervical, supraclavicular, or axillary adenopathy. Abdomen soft, non-tender, normal bowel sounds.  On pelvic examination the external genitalia were unremarkable. A speculum exam was performed.  There are no mucosal lesions noted in the vaginal vault.  On bimanual examination there were no pelvic masses appreciated.  Vaginal cuff intact.  Rectal sphincter tone normal.  No endorectal masses noted.   Lab Findings: Lab Results  Component Value Date   WBC 5.3 05/08/2017   HGB 14.6 05/08/2017   HCT 43.1 05/08/2017   MCV 90.1 05/08/2017   PLT 302.0 05/08/2017    Radiographic Findings: No results found.  Impression:  Stage IB Grade 2 Endometrioid Adenocarcinoma No evidence of recurrence clinical exam.  I congratulated the patient on successful 5-year  follow-up without recurrence.  I recommended she follow-up with her gynecologist Dr. Hulan Fray who diagnosed her problems several years ago.  Patient will pursue setting up this appointment.  If she has difficulty she will let us know.   Plan: Routine follow-up with Dr. Hulan Fray.  ____________________________________  Blair Promise, PhD, MD  This document serves as a record of services personally performed by Gery Pray, MD. It was created on his behalf by Wilburn Mylar, a trained medical scribe. The creation of this record is based on the scribe's personal observations and the provider's statements to them. This document has been checked and approved by the attending provider.

## 2019-02-15 ENCOUNTER — Other Ambulatory Visit: Payer: Self-pay | Admitting: Family Medicine

## 2019-02-15 NOTE — Telephone Encounter (Signed)
Requesting: tramadol Contract:n/a  UDS:n/a Last Visit:08/21/18 Next Visit:advised to f/u 06/2018 for CPE Last Refill:08/21/18(30,0)   RF request for meloxicam LOV:08/21/18 Next ov: advised to f/u April for CPE Last written:01/02/18 (30,5)   Please Advise. Medications pending.

## 2019-03-26 DIAGNOSIS — M25551 Pain in right hip: Secondary | ICD-10-CM

## 2019-03-26 HISTORY — DX: Pain in right hip: M25.551

## 2019-06-28 ENCOUNTER — Other Ambulatory Visit: Payer: Self-pay | Admitting: Family Medicine

## 2019-06-28 NOTE — Telephone Encounter (Signed)
LM for pt to call back for scheduling. She has not been seen for routine f/u since 2019.

## 2019-06-30 NOTE — Telephone Encounter (Signed)
RF request for Pantoprazole 40mg  LOV:08/21/18 Next ov:07/15/19  Last written:12/23/2017(90,1)   Requesting: tramadol 50mg  Contract:n/a UDS:n/a Last Visit:08/21/18 Next Visit:07/15/19 Last Refill:02/16/19(30,0)  Please Advise. Medications pending

## 2019-06-30 NOTE — Telephone Encounter (Signed)
Pt has been scheduled for 4/22, will send in enough until appt.

## 2019-07-01 DIAGNOSIS — M2012 Hallux valgus (acquired), left foot: Secondary | ICD-10-CM | POA: Diagnosis not present

## 2019-07-01 DIAGNOSIS — M2011 Hallux valgus (acquired), right foot: Secondary | ICD-10-CM | POA: Diagnosis not present

## 2019-07-01 DIAGNOSIS — M79671 Pain in right foot: Secondary | ICD-10-CM | POA: Diagnosis not present

## 2019-07-01 DIAGNOSIS — M79672 Pain in left foot: Secondary | ICD-10-CM | POA: Diagnosis not present

## 2019-07-01 NOTE — Telephone Encounter (Signed)
Refills sent

## 2019-07-14 ENCOUNTER — Other Ambulatory Visit: Payer: Self-pay

## 2019-07-15 ENCOUNTER — Ambulatory Visit (INDEPENDENT_AMBULATORY_CARE_PROVIDER_SITE_OTHER): Payer: Medicare Other | Admitting: Family Medicine

## 2019-07-15 ENCOUNTER — Encounter: Payer: Self-pay | Admitting: Family Medicine

## 2019-07-15 ENCOUNTER — Ambulatory Visit (HOSPITAL_BASED_OUTPATIENT_CLINIC_OR_DEPARTMENT_OTHER)
Admission: RE | Admit: 2019-07-15 | Discharge: 2019-07-15 | Disposition: A | Discharge status: Home / Self Care | Payer: Medicare Other | Source: Ambulatory Visit | Attending: Family Medicine | Admitting: Family Medicine

## 2019-07-15 VITALS — BP 121/77 | HR 68 | Temp 97.6°F | Resp 16 | Ht 60.0 in | Wt 146.8 lb

## 2019-07-15 DIAGNOSIS — M159 Polyosteoarthritis, unspecified: Secondary | ICD-10-CM | POA: Diagnosis not present

## 2019-07-15 DIAGNOSIS — Z23 Encounter for immunization: Secondary | ICD-10-CM | POA: Diagnosis not present

## 2019-07-15 DIAGNOSIS — Z0000 Encounter for general adult medical examination without abnormal findings: Secondary | ICD-10-CM

## 2019-07-15 DIAGNOSIS — R2681 Unsteadiness on feet: Secondary | ICD-10-CM

## 2019-07-15 DIAGNOSIS — E7800 Pure hypercholesterolemia, unspecified: Secondary | ICD-10-CM | POA: Diagnosis not present

## 2019-07-15 DIAGNOSIS — M25551 Pain in right hip: Secondary | ICD-10-CM | POA: Insufficient documentation

## 2019-07-15 DIAGNOSIS — E2839 Other primary ovarian failure: Secondary | ICD-10-CM

## 2019-07-15 DIAGNOSIS — M8580 Other specified disorders of bone density and structure, unspecified site: Secondary | ICD-10-CM

## 2019-07-15 DIAGNOSIS — Z1231 Encounter for screening mammogram for malignant neoplasm of breast: Secondary | ICD-10-CM

## 2019-07-15 LAB — LIPID PANEL
Cholesterol: 213 mg/dL — ABNORMAL HIGH (ref 0–200)
HDL: 51.7 mg/dL (ref >39.00)
LDL Cholesterol: 140 mg/dL — ABNORMAL HIGH (ref 0–99)
NonHDL: 161.19
Total CHOL/HDL Ratio: 4
Triglycerides: 106 mg/dL (ref 0.0–149.0)
VLDL: 21.2 mg/dL (ref 0.0–40.0)

## 2019-07-15 LAB — CBC WITH DIFFERENTIAL/PLATELET
Basophils Absolute: 0 10*3/uL (ref 0.0–0.1)
Basophils Relative: 0.8 % (ref 0.0–3.0)
Eosinophils Absolute: 0.2 10*3/uL (ref 0.0–0.7)
Eosinophils Relative: 2.9 % (ref 0.0–5.0)
HCT: 44.4 % (ref 36.0–46.0)
Hemoglobin: 14.8 g/dL (ref 12.0–15.0)
Lymphocytes Relative: 37.8 % (ref 12.0–46.0)
Lymphs Abs: 2.1 10*3/uL (ref 0.7–4.0)
MCHC: 33.3 g/dL (ref 30.0–36.0)
MCV: 90.7 fl (ref 78.0–100.0)
Monocytes Absolute: 0.6 10*3/uL (ref 0.1–1.0)
Monocytes Relative: 10.1 % (ref 3.0–12.0)
Neutro Abs: 2.7 10*3/uL (ref 1.4–7.7)
Neutrophils Relative %: 48.4 % (ref 43.0–77.0)
Platelets: 294 10*3/uL (ref 150.0–400.0)
RBC: 4.89 Mil/uL (ref 3.87–5.11)
RDW: 13.3 % (ref 11.5–15.5)
WBC: 5.7 10*3/uL (ref 4.0–10.5)

## 2019-07-15 LAB — COMPREHENSIVE METABOLIC PANEL
ALT: 14 U/L (ref 0–35)
AST: 17 U/L (ref 0–37)
Albumin: 4.1 g/dL (ref 3.5–5.2)
Alkaline Phosphatase: 60 U/L (ref 39–117)
BUN: 21 mg/dL (ref 6–23)
CO2: 31 mEq/L (ref 19–32)
Calcium: 9.1 mg/dL (ref 8.4–10.5)
Chloride: 103 mEq/L (ref 96–112)
Creatinine, Ser: 0.71 mg/dL (ref 0.40–1.20)
GFR: 81.9 mL/min (ref >60.00)
Glucose, Bld: 92 mg/dL (ref 70–99)
Potassium: 4.4 mEq/L (ref 3.5–5.1)
Sodium: 140 mEq/L (ref 135–145)
Total Bilirubin: 0.6 mg/dL (ref 0.2–1.2)
Total Protein: 6.7 g/dL (ref 6.0–8.3)

## 2019-07-15 LAB — TSH: TSH: 3.4 u[IU]/mL (ref 0.35–4.50)

## 2019-07-15 NOTE — Patient Instructions (Signed)
Health Maintenance, Female Adopting a healthy lifestyle and getting preventive care are important in promoting health and wellness. Ask your health care provider about:  The right schedule for you to have regular tests and exams.  Things you can do on your own to prevent diseases and keep yourself healthy. What should I know about diet, weight, and exercise? Eat a healthy diet   Eat a diet that includes plenty of vegetables, fruits, low-fat dairy products, and lean protein.  Do not eat a lot of foods that are high in solid fats, added sugars, or sodium. Maintain a healthy weight Body mass index (BMI) is used to identify weight problems. It estimates body fat based on height and weight. Your health care provider can help determine your BMI and help you achieve or maintain a healthy weight. Get regular exercise Get regular exercise. This is one of the most important things you can do for your health. Most adults should:  Exercise for at least 150 minutes each week. The exercise should increase your heart rate and make you sweat (moderate-intensity exercise).  Do strengthening exercises at least twice a week. This is in addition to the moderate-intensity exercise.  Spend less time sitting. Even light physical activity can be beneficial. Watch cholesterol and blood lipids Have your blood tested for lipids and cholesterol at 68 years of age, then have this test every 5 years. Have your cholesterol levels checked more often if:  Your lipid or cholesterol levels are high.  You are older than 68 years of age.  You are at high risk for heart disease. What should I know about cancer screening? Depending on your health history and family history, you may need to have cancer screening at various ages. This may include screening for:  Breast cancer.  Cervical cancer.  Colorectal cancer.  Skin cancer.  Lung cancer. What should I know about heart disease, diabetes, and high blood  pressure? Blood pressure and heart disease  High blood pressure causes heart disease and increases the risk of stroke. This is more likely to develop in people who have high blood pressure readings, are of African descent, or are overweight.  Have your blood pressure checked: ? Every 3-5 years if you are 18-39 years of age. ? Every year if you are 40 years old or older. Diabetes Have regular diabetes screenings. This checks your fasting blood sugar level. Have the screening done:  Once every three years after age 40 if you are at a normal weight and have a low risk for diabetes.  More often and at a younger age if you are overweight or have a high risk for diabetes. What should I know about preventing infection? Hepatitis B If you have a higher risk for hepatitis B, you should be screened for this virus. Talk with your health care provider to find out if you are at risk for hepatitis B infection. Hepatitis C Testing is recommended for:  Everyone born from 1945 through 1965.  Anyone with known risk factors for hepatitis C. Sexually transmitted infections (STIs)  Get screened for STIs, including gonorrhea and chlamydia, if: ? You are sexually active and are younger than 68 years of age. ? You are older than 68 years of age and your health care provider tells you that you are at risk for this type of infection. ? Your sexual activity has changed since you were last screened, and you are at increased risk for chlamydia or gonorrhea. Ask your health care provider if   you are at risk.  Ask your health care provider about whether you are at high risk for HIV. Your health care provider may recommend a prescription medicine to help prevent HIV infection. If you choose to take medicine to prevent HIV, you should first get tested for HIV. You should then be tested every 3 months for as long as you are taking the medicine. Pregnancy  If you are about to stop having your period (premenopausal) and  you may become pregnant, seek counseling before you get pregnant.  Take 400 to 800 micrograms (mcg) of folic acid every day if you become pregnant.  Ask for birth control (contraception) if you want to prevent pregnancy. Osteoporosis and menopause Osteoporosis is a disease in which the bones lose minerals and strength with aging. This can result in bone fractures. If you are 65 years old or older, or if you are at risk for osteoporosis and fractures, ask your health care provider if you should:  Be screened for bone loss.  Take a calcium or vitamin D supplement to lower your risk of fractures.  Be given hormone replacement therapy (HRT) to treat symptoms of menopause. Follow these instructions at home: Lifestyle  Do not use any products that contain nicotine or tobacco, such as cigarettes, e-cigarettes, and chewing tobacco. If you need help quitting, ask your health care provider.  Do not use street drugs.  Do not share needles.  Ask your health care provider for help if you need support or information about quitting drugs. Alcohol use  Do not drink alcohol if: ? Your health care provider tells you not to drink. ? You are pregnant, may be pregnant, or are planning to become pregnant.  If you drink alcohol: ? Limit how much you use to 0-1 drink a day. ? Limit intake if you are breastfeeding.  Be aware of how much alcohol is in your drink. In the [place] one drink equals one 12 oz bottle of beer (355 mL), one 5 oz glass of wine (148 mL), or one 1 oz glass of hard liquor (44 mL). General instructions  Schedule regular health, dental, and eye exams.  Stay current with your vaccines.  Tell your health care provider if: ? You often feel depressed. ? You have ever been abused or do not feel safe at home. Summary  Adopting a healthy lifestyle and getting preventive care are important in promoting health and wellness.  Follow your health care provider's instructions about healthy  diet, exercising, and getting tested or screened for diseases.  Follow your health care provider's instructions on monitoring your cholesterol and blood pressure. This information is not intended to replace advice given to you by your health care provider. Make sure you discuss any questions you have with your health care provider. Document Revised: 03/04/2018 Document Reviewed: 03/04/2018 Elsevier Patient Education  2020 [employer]  

## 2019-07-15 NOTE — Addendum Note (Signed)
Addended by: Deveron Furlong D on: 07/15/2019 09:53 AM   Modules accepted: Orders

## 2019-07-15 NOTE — Progress Notes (Signed)
Office Note 07/15/2019  CC:  Chief Complaint  Patient presents with  . Annual Exam    pt is fasting    HPI:  Kelsey Bruce is a 69 y.o. White female who is here for annual health maintenance exam.   Has osteoarthritis of multiple sites.  She takes tramadol for pain on infrequent basis as needed for when her meloxicam is inadequate.  Her R hip pain is more persistent, pain extends down R leg, R knee, R lower leg.  Impairs mobility, gait feels unstable. No recent falls. No swelling leg or joints.  Taking aleve some days and meloxicam others. Takes 1 tramadol hs and this brings better relief and allows her to sleep.  She is likely going to start trying one daytime dose of tramadol since her pain is now more consistently worse. PMP AWARE reviewed today: most recent rx for tramadol was filled 4.7.21, # 30, rx by me. No red flags.   Past Medical History:  Diagnosis Date  . Allergy   . Borderline hyperlipidemia    [place] CV risk 2017= 6%.  . Cataract   . Cerebral palsy ([place])   . Colon polyp, hyperplastic 2004; 12/2015   Recall 12/2025  . Difficulty sleeping   . Endometrial adenocarcinoma ([place]) 09/2013   Dr. Denman George: pt got vaginal brachytherapy via rad onc.  No sign of dz recurrence as of 02/2016 Gyn Onc f/u.  Next f/u with them is 6 mo.  . Morton's neuroma 2012   Dr. Roseanne Reno did injections in the past (left foot)  . Osteoarthritis    LB, HIPs, knees (L knee end stage), hands  . Osteopenia    DEXA 10/2017 was -1.5.  Repeat 10/2019.  . Radiation 12/16/13, 12/22/13, 12/30/13, 01/06/14, 01/13/14   proximal vagina 30 gray  . Seasonal allergic rhinitis     Past Surgical History:  Procedure Laterality Date  . COLONOSCOPY  09/2002; 12/2015   2004 hyperplastic; recall sent 2009 but pt apparently didn't respond.  12/2015: one sessile serrated polyp w/out cytologic atypia: recall 10 yrs per GI.  Marland Kitchen DEXA  08/2007; 11/14/15; 10/2017   2009 T score -1.2.  2017 T score -1.6.  2019 T  score -1.5.  Plan recheck DEXA 10/2019.  Marland Kitchen DILATION AND CURETTAGE OF UTERUS    . LEG SURGERY Left 01/2014   "cleaned out cartilage"  . ROBOTIC ASSISTED TOTAL HYSTERECTOMY WITH BILATERAL SALPINGO OOPHERECTOMY Bilateral 10/05/2013   Procedure: ROBOTIC ASSISTED TOTAL HYSTERECTOMY WITH BILATERAL SALPINGO OOPHORECTOMY WITH LYMPH NODE DISECTION;  Surgeon: Everitt Amber, MD;  Location: [facility];  Service: Gynecology;  Laterality: Bilateral;  . TONSILLECTOMY    . TOTAL KNEE ARTHROPLASTY Left 12/07/2014   Procedure: TOTAL KNEE ARTHROPLASTY;  Surgeon: Latanya Maudlin, MD;  Location: [facility];  Service: Orthopedics;  Laterality: Left;  . TUBAL LIGATION      Family History  Problem Relation Age of Onset  . Heart disease Father   . Hypertension Father   . Cancer Father        lung  . Arthritis Father   . Alcohol abuse Father   . Heart disease Mother   . Cancer Mother        lung  . Deep vein thrombosis Mother   . Alcohol abuse Mother   . Heart disease Brother   . Hypertension Brother   . Heart disease Brother   . Hypertension Brother   . Heart disease Brother   . Hypertension Brother   . Stomach cancer Maternal Grandfather   .  Colon cancer Neg Hx   . Esophageal cancer Neg Hx   . Rectal cancer Neg Hx     Social History   Socioeconomic History  . Marital status: Divorced    Spouse name: Not on file  . Number of children: 2  . Years of education: Not on file  . Highest education level: Not on file  Occupational History  . Occupation: retired  Tobacco Use  . Smoking status: Never Smoker  . Smokeless tobacco: Never Used  Substance and Sexual Activity  . Alcohol use: No    Comment: occasional  . Drug use: No  . Sexual activity: Not on file  Other Topics Concern  . Not on file  Social History Narrative   Divorced.  Two children.   Orig from [place], [place].   HS education.   Retired.   No tobacco, occ alcohol, no drugs.   Has 3 brothers and they all smoked, as did her parents.    Social Determinants of Health   Financial Resource Strain:   . Difficulty of Paying Living Expenses:   Food Insecurity:   . Worried About Charity fundraiser in the Last Year:   . Arboriculturist in the Last Year:   Transportation Needs:   . Film/video editor (Medical):   Marland Kitchen Lack of Transportation (Non-Medical):   Physical Activity:   . Days of Exercise per Week:   . Minutes of Exercise per Session:   Stress:   . Feeling of Stress :   Social Connections:   . Frequency of Communication with Friends and Family:   . Frequency of Social Gatherings with Friends and Family:   . Attends Religious Services:   . Active Member of Clubs or Organizations:   . Attends Archivist Meetings:   Marland Kitchen Marital Status:   Intimate Partner Violence:   . Fear of Current or Ex-Partner:   . Emotionally Abused:   Marland Kitchen Physically Abused:   . Sexually Abused:     Outpatient Medications Prior to Visit  Medication Sig Dispense Refill  . aspirin 81 MG tablet Take 81 mg by mouth 2 (two) times a week.    . beta carotene w/minerals (OCUVITE) tablet Take 1 tablet by mouth every morning. Reported on 06/01/2015    . CALCIUM PO Take 1 tablet by mouth daily.    . cetirizine (ZYRTEC) 10 MG tablet Take 10 mg by mouth daily as needed for allergies. Reported on 06/01/2015    . Cholecalciferol (VITAMIN D PO) Take 1 tablet by mouth daily.    Marland Kitchen CRANBERRY PO Take 4,200 mg by mouth. Take 2 capsules daily.    . meloxicam (MOBIC) 15 MG tablet TAKE ONE TABLET BY MOUTH DAILY 30 tablet 5  . pantoprazole (PROTONIX) 40 MG tablet TAKE ONE TABLET BY MOUTH DAILY 90 tablet 3  . polyethylene glycol (MIRALAX / GLYCOLAX) packet Take 17 g by mouth daily as needed for mild constipation. Reported on 06/01/2015    . Probiotic Product (PROBIOTIC PO) Take 1 tablet by mouth every morning. Reported on 06/01/2015    . traMADol (ULTRAM) 50 MG tablet TAKE 1-2 TABLETS BY MOUTH THREE TIMES DAILY AS NEEDED FOR PAIN 30 tablet 0  . vitamin B-12  (CYANOCOBALAMIN) 1000 MCG tablet Take 1,000 mcg by mouth daily.    . cyclobenzaprine (FLEXERIL) 10 MG tablet Take 1 tablet (10 mg total) by mouth 3 (three) times daily as needed for muscle spasms. (Patient not taking: Reported on 07/15/2019) 30  tablet 0  . docusate sodium (COLACE) 100 MG capsule Take 100 mg by mouth daily as needed for mild constipation. Reported on 06/01/2015     No facility-administered medications prior to visit.    Allergies  Allergen Reactions  . Aspirin Nausea Only    High dose only  . Seasonal Ic [Cholestatin]     ROS Review of Systems  Constitutional: Negative for appetite change, chills, fatigue and fever.  HENT: Negative for congestion, dental problem, ear pain and sore throat.   Eyes: Negative for discharge, redness and visual disturbance.  Respiratory: Negative for cough, chest tightness, shortness of breath and wheezing.   Cardiovascular: Negative for chest pain, palpitations and leg swelling.  Gastrointestinal: Negative for abdominal pain, blood in stool, diarrhea, nausea and vomiting.  Genitourinary: Negative for difficulty urinating, dysuria, flank pain, frequency, hematuria and urgency.  Musculoskeletal: Positive for arthralgias (essentially all joints hurt and are stiff). Negative for back pain, joint swelling, myalgias and neck stiffness.  Skin: Negative for pallor and rash.  Neurological: Positive for dizziness (intermittent mild positional vertigo). Negative for speech difficulty, weakness and headaches.  Hematological: Negative for adenopathy. Does not bruise/bleed easily.  Psychiatric/Behavioral: Negative for confusion and sleep disturbance. The patient is not nervous/anxious.     PE; Vitals with BMI 07/15/2019 11/16/2018 05/18/2018  Height 5\' 0"  5\' 0"  5\' 0"   Weight 146 lbs 13 oz 148 lbs 2 oz 148 lbs 13 oz  BMI 28.67 [id number] [id number]  Systolic 123XX123 [id number]  Diastolic 77 86 71  Pulse 68 75 72   Gen: Alert, well appearing.  Patient is oriented to  person, place, time, and situation. AFFECT: pleasant, lucid thought and speech. CV: RRR, no m/r/g.   LUNGS: CTA bilat, nonlabored resps, good aeration in all lung fields. ABD: soft, NT, ND, BS normal.  No hepatospenomegaly or mass.  No bruits. EXT: no clubbing or cyanosis.  no edema.  Neuro: CN 2-12 intact bilaterally, strength 5/5 in proximal and distal upper extremities and lower extremities bilaterally.  No sensory deficits.  No tremor.  No disdiadochokinesis.  No ataxia.  Upper extremity and lower extremity DTRs symmetric.  No pronator drift. She walks quite slow, gait a bit unsteady.  No shuffling.  Pertinent labs:  Lab Results  Component Value Date   TSH 3.06 10/06/2015   Lab Results  Component Value Date   WBC 5.3 05/08/2017   HGB 14.6 05/08/2017   HCT 43.1 05/08/2017   MCV 90.1 05/08/2017   PLT 302.0 05/08/2017   Lab Results  Component Value Date   CREATININE 0.80 05/08/2017   BUN 17 05/08/2017   NA 139 05/08/2017   K 4.2 05/08/2017   CL 103 05/08/2017   CO2 31 05/08/2017   Lab Results  Component Value Date   ALT 13 05/08/2017   AST 14 05/08/2017   ALKPHOS 58 05/08/2017   BILITOT 0.7 05/08/2017   Lab Results  Component Value Date   CHOL 200 12/23/2017   Lab Results  Component Value Date   HDL 44.90 12/23/2017   Lab Results  Component Value Date   LDLCALC 134 (H) 12/23/2017   Lab Results  Component Value Date   TRIG 105.0 12/23/2017   Lab Results  Component Value Date   CHOLHDL 4 12/23/2017   ASSESSMENT AND PLAN:   1) Osteoarthritis multiple sites.  Grad worsening R hip and leg pain. Will check R hip plain films and see if any significant change compared to 2019 films. She'll continue daily  NSAID and we'll keep an eye on renal function. She can increase intake of tramadol to a daytime dose plus her nighttime dose. CSC today. PT referral for gait instability and hopefull to help with R hip pain arthritic pain and compensatory muscle pain.  2)  Health maintenance exam: Reviewed age and gender appropriate health maintenance issues (prudent diet, regular exercise, health risks of tobacco and excessive alcohol, use of seatbelts, fire alarms in home, use of sunscreen).  Also reviewed age and gender appropriate health screening as well as vaccine recommendations. Vaccines:  Pneumovax 23->given today.  Shingrix->rx sent to pharmacy today.  She has had both covid shots.     Labs: fasting HP ordered. Cervical ca screening: hx of endometrial ca, pt s/p TAH/BSO 5 yrs ago.  F/u with GYN MD. Breast ca screening: next mammo due 09/2019. Osteoporosis screening: hx of osteopenia.  Plan repeat DEXA 10/2019. Colon ca screening: recall 2027  An After Visit Summary was printed and given to the patient.  FOLLOW UP:  No follow-ups on file.  Signed:  Crissie Sickles, MD           07/15/2019

## 2019-07-16 ENCOUNTER — Encounter: Payer: Self-pay | Admitting: Family Medicine

## 2019-07-19 ENCOUNTER — Other Ambulatory Visit: Payer: Self-pay | Admitting: Family Medicine

## 2019-07-19 DIAGNOSIS — M25551 Pain in right hip: Secondary | ICD-10-CM

## 2019-07-26 DIAGNOSIS — M25551 Pain in right hip: Secondary | ICD-10-CM | POA: Diagnosis not present

## 2019-07-26 DIAGNOSIS — R2681 Unsteadiness on feet: Secondary | ICD-10-CM | POA: Diagnosis not present

## 2019-07-26 DIAGNOSIS — R262 Difficulty in walking, not elsewhere classified: Secondary | ICD-10-CM | POA: Diagnosis not present

## 2019-07-27 ENCOUNTER — Telehealth: Payer: Self-pay

## 2019-07-27 NOTE — Telephone Encounter (Signed)
Received POC recommendations from [place] on PCP desk to review and sign, if appropriate.

## 2019-07-30 DIAGNOSIS — R262 Difficulty in walking, not elsewhere classified: Secondary | ICD-10-CM | POA: Diagnosis not present

## 2019-07-30 DIAGNOSIS — M25551 Pain in right hip: Secondary | ICD-10-CM | POA: Diagnosis not present

## 2019-07-30 DIAGNOSIS — R2681 Unsteadiness on feet: Secondary | ICD-10-CM | POA: Diagnosis not present

## 2019-08-02 DIAGNOSIS — M25551 Pain in right hip: Secondary | ICD-10-CM | POA: Diagnosis not present

## 2019-08-02 DIAGNOSIS — R2681 Unsteadiness on feet: Secondary | ICD-10-CM | POA: Diagnosis not present

## 2019-08-02 DIAGNOSIS — R262 Difficulty in walking, not elsewhere classified: Secondary | ICD-10-CM | POA: Diagnosis not present

## 2019-08-05 DIAGNOSIS — R262 Difficulty in walking, not elsewhere classified: Secondary | ICD-10-CM | POA: Diagnosis not present

## 2019-08-05 DIAGNOSIS — M25551 Pain in right hip: Secondary | ICD-10-CM | POA: Diagnosis not present

## 2019-08-05 DIAGNOSIS — R2681 Unsteadiness on feet: Secondary | ICD-10-CM | POA: Diagnosis not present

## 2019-08-09 ENCOUNTER — Telehealth: Payer: Self-pay

## 2019-08-09 DIAGNOSIS — H353131 Nonexudative age-related macular degeneration, bilateral, early dry stage: Secondary | ICD-10-CM | POA: Diagnosis not present

## 2019-08-09 DIAGNOSIS — H2513 Age-related nuclear cataract, bilateral: Secondary | ICD-10-CM | POA: Diagnosis not present

## 2019-08-09 LAB — HEMOGLOBIN A1C: Hemoglobin A1C: 5.8

## 2019-08-09 NOTE — Telephone Encounter (Signed)
Patient was calling to see if our office could provide her with a disc of recent imaging done on 07/15/19. She has an upcoming appt with ortho md. She was advised it would be best to call and obtain this information from imaging location. Their number was provided for further assistance.

## 2019-08-12 DIAGNOSIS — R262 Difficulty in walking, not elsewhere classified: Secondary | ICD-10-CM | POA: Diagnosis not present

## 2019-08-12 DIAGNOSIS — R2681 Unsteadiness on feet: Secondary | ICD-10-CM | POA: Diagnosis not present

## 2019-08-12 DIAGNOSIS — M25551 Pain in right hip: Secondary | ICD-10-CM | POA: Diagnosis not present

## 2019-08-16 DIAGNOSIS — R2681 Unsteadiness on feet: Secondary | ICD-10-CM | POA: Diagnosis not present

## 2019-08-16 DIAGNOSIS — R262 Difficulty in walking, not elsewhere classified: Secondary | ICD-10-CM | POA: Diagnosis not present

## 2019-08-16 DIAGNOSIS — M25551 Pain in right hip: Secondary | ICD-10-CM | POA: Diagnosis not present

## 2019-08-20 DIAGNOSIS — M5416 Radiculopathy, lumbar region: Secondary | ICD-10-CM | POA: Diagnosis not present

## 2019-08-20 DIAGNOSIS — M25551 Pain in right hip: Secondary | ICD-10-CM | POA: Diagnosis not present

## 2019-08-26 ENCOUNTER — Encounter: Payer: Self-pay | Admitting: Family Medicine

## 2019-09-03 DIAGNOSIS — R2681 Unsteadiness on feet: Secondary | ICD-10-CM | POA: Diagnosis not present

## 2019-09-03 DIAGNOSIS — M25551 Pain in right hip: Secondary | ICD-10-CM | POA: Diagnosis not present

## 2019-09-03 DIAGNOSIS — R262 Difficulty in walking, not elsewhere classified: Secondary | ICD-10-CM | POA: Diagnosis not present

## 2019-09-06 DIAGNOSIS — R262 Difficulty in walking, not elsewhere classified: Secondary | ICD-10-CM | POA: Diagnosis not present

## 2019-09-06 DIAGNOSIS — R2681 Unsteadiness on feet: Secondary | ICD-10-CM | POA: Diagnosis not present

## 2019-09-06 DIAGNOSIS — M25551 Pain in right hip: Secondary | ICD-10-CM | POA: Diagnosis not present

## 2019-09-10 DIAGNOSIS — R262 Difficulty in walking, not elsewhere classified: Secondary | ICD-10-CM | POA: Diagnosis not present

## 2019-09-10 DIAGNOSIS — R2681 Unsteadiness on feet: Secondary | ICD-10-CM | POA: Diagnosis not present

## 2019-09-10 DIAGNOSIS — M25551 Pain in right hip: Secondary | ICD-10-CM | POA: Diagnosis not present

## 2019-09-24 DIAGNOSIS — R262 Difficulty in walking, not elsewhere classified: Secondary | ICD-10-CM | POA: Diagnosis not present

## 2019-09-24 DIAGNOSIS — M25551 Pain in right hip: Secondary | ICD-10-CM | POA: Diagnosis not present

## 2019-09-24 DIAGNOSIS — R2681 Unsteadiness on feet: Secondary | ICD-10-CM | POA: Diagnosis not present

## 2019-10-01 DIAGNOSIS — M25551 Pain in right hip: Secondary | ICD-10-CM | POA: Diagnosis not present

## 2019-10-01 DIAGNOSIS — R2681 Unsteadiness on feet: Secondary | ICD-10-CM | POA: Diagnosis not present

## 2019-10-01 DIAGNOSIS — R262 Difficulty in walking, not elsewhere classified: Secondary | ICD-10-CM | POA: Diagnosis not present

## 2019-10-18 DIAGNOSIS — R2681 Unsteadiness on feet: Secondary | ICD-10-CM | POA: Diagnosis not present

## 2019-10-18 DIAGNOSIS — M25551 Pain in right hip: Secondary | ICD-10-CM | POA: Diagnosis not present

## 2019-10-18 DIAGNOSIS — R262 Difficulty in walking, not elsewhere classified: Secondary | ICD-10-CM | POA: Diagnosis not present

## 2019-11-01 DIAGNOSIS — R2681 Unsteadiness on feet: Secondary | ICD-10-CM | POA: Diagnosis not present

## 2019-11-01 DIAGNOSIS — M25551 Pain in right hip: Secondary | ICD-10-CM | POA: Diagnosis not present

## 2019-11-01 DIAGNOSIS — R262 Difficulty in walking, not elsewhere classified: Secondary | ICD-10-CM | POA: Diagnosis not present

## 2019-11-15 DIAGNOSIS — R262 Difficulty in walking, not elsewhere classified: Secondary | ICD-10-CM | POA: Diagnosis not present

## 2019-11-15 DIAGNOSIS — R2681 Unsteadiness on feet: Secondary | ICD-10-CM | POA: Diagnosis not present

## 2019-11-15 DIAGNOSIS — M25551 Pain in right hip: Secondary | ICD-10-CM | POA: Diagnosis not present

## 2019-11-22 ENCOUNTER — Ambulatory Visit (HOSPITAL_BASED_OUTPATIENT_CLINIC_OR_DEPARTMENT_OTHER)
Admission: RE | Admit: 2019-11-22 | Discharge: 2019-11-22 | Disposition: A | Discharge status: Home / Self Care | Payer: Medicare Other | Source: Ambulatory Visit | Attending: Family Medicine | Admitting: Family Medicine

## 2019-11-22 ENCOUNTER — Other Ambulatory Visit: Payer: Self-pay

## 2019-11-22 ENCOUNTER — Encounter (HOSPITAL_BASED_OUTPATIENT_CLINIC_OR_DEPARTMENT_OTHER): Payer: Self-pay

## 2019-11-22 DIAGNOSIS — Z1231 Encounter for screening mammogram for malignant neoplasm of breast: Secondary | ICD-10-CM | POA: Diagnosis not present

## 2019-11-22 DIAGNOSIS — Z780 Asymptomatic menopausal state: Secondary | ICD-10-CM | POA: Diagnosis not present

## 2019-11-22 DIAGNOSIS — E2839 Other primary ovarian failure: Secondary | ICD-10-CM

## 2019-11-22 DIAGNOSIS — M85852 Other specified disorders of bone density and structure, left thigh: Secondary | ICD-10-CM | POA: Diagnosis not present

## 2019-11-22 DIAGNOSIS — M8580 Other specified disorders of bone density and structure, unspecified site: Secondary | ICD-10-CM | POA: Diagnosis not present

## 2019-11-23 ENCOUNTER — Encounter: Payer: Self-pay | Admitting: Family Medicine

## 2019-12-13 ENCOUNTER — Encounter: Payer: Self-pay | Admitting: Family Medicine

## 2019-12-16 ENCOUNTER — Other Ambulatory Visit: Payer: Self-pay | Admitting: Family Medicine

## 2019-12-17 DIAGNOSIS — R262 Difficulty in walking, not elsewhere classified: Secondary | ICD-10-CM | POA: Diagnosis not present

## 2019-12-17 DIAGNOSIS — R2681 Unsteadiness on feet: Secondary | ICD-10-CM | POA: Diagnosis not present

## 2019-12-17 DIAGNOSIS — M25551 Pain in right hip: Secondary | ICD-10-CM | POA: Diagnosis not present

## 2019-12-20 DIAGNOSIS — M25551 Pain in right hip: Secondary | ICD-10-CM | POA: Diagnosis not present

## 2019-12-20 DIAGNOSIS — R262 Difficulty in walking, not elsewhere classified: Secondary | ICD-10-CM | POA: Diagnosis not present

## 2019-12-20 DIAGNOSIS — R2681 Unsteadiness on feet: Secondary | ICD-10-CM | POA: Diagnosis not present

## 2019-12-24 DIAGNOSIS — M25551 Pain in right hip: Secondary | ICD-10-CM | POA: Diagnosis not present

## 2019-12-24 DIAGNOSIS — R262 Difficulty in walking, not elsewhere classified: Secondary | ICD-10-CM | POA: Diagnosis not present

## 2019-12-24 DIAGNOSIS — R2681 Unsteadiness on feet: Secondary | ICD-10-CM | POA: Diagnosis not present

## 2019-12-31 DIAGNOSIS — R2681 Unsteadiness on feet: Secondary | ICD-10-CM | POA: Diagnosis not present

## 2019-12-31 DIAGNOSIS — R262 Difficulty in walking, not elsewhere classified: Secondary | ICD-10-CM | POA: Diagnosis not present

## 2019-12-31 DIAGNOSIS — M25551 Pain in right hip: Secondary | ICD-10-CM | POA: Diagnosis not present

## 2020-01-07 DIAGNOSIS — R2681 Unsteadiness on feet: Secondary | ICD-10-CM | POA: Diagnosis not present

## 2020-01-07 DIAGNOSIS — R262 Difficulty in walking, not elsewhere classified: Secondary | ICD-10-CM | POA: Diagnosis not present

## 2020-01-07 DIAGNOSIS — M25551 Pain in right hip: Secondary | ICD-10-CM | POA: Diagnosis not present

## 2020-01-14 DIAGNOSIS — M25512 Pain in left shoulder: Secondary | ICD-10-CM | POA: Diagnosis not present

## 2020-01-20 ENCOUNTER — Other Ambulatory Visit: Payer: Self-pay

## 2020-01-20 ENCOUNTER — Telehealth (INDEPENDENT_AMBULATORY_CARE_PROVIDER_SITE_OTHER): Payer: Medicare Other | Admitting: Family Medicine

## 2020-01-20 ENCOUNTER — Ambulatory Visit (INDEPENDENT_AMBULATORY_CARE_PROVIDER_SITE_OTHER): Payer: Medicare Other

## 2020-01-20 ENCOUNTER — Encounter: Payer: Self-pay | Admitting: Family Medicine

## 2020-01-20 DIAGNOSIS — Z791 Long term (current) use of non-steroidal anti-inflammatories (NSAID): Secondary | ICD-10-CM

## 2020-01-20 DIAGNOSIS — M25551 Pain in right hip: Secondary | ICD-10-CM | POA: Diagnosis not present

## 2020-01-20 DIAGNOSIS — Z5181 Encounter for therapeutic drug level monitoring: Secondary | ICD-10-CM | POA: Diagnosis not present

## 2020-01-20 DIAGNOSIS — Z23 Encounter for immunization: Secondary | ICD-10-CM

## 2020-01-20 LAB — BASIC METABOLIC PANEL
BUN: 18 mg/dL (ref 6–23)
CO2: 29 mEq/L (ref 19–32)
Calcium: 9 mg/dL (ref 8.4–10.5)
Chloride: 104 mEq/L (ref 96–112)
Creatinine, Ser: 0.74 mg/dL (ref 0.40–1.20)
GFR: 83.15 mL/min (ref >60.00)
Glucose, Bld: 118 mg/dL — ABNORMAL HIGH (ref 70–99)
Potassium: 4.2 mEq/L (ref 3.5–5.1)
Sodium: 139 mEq/L (ref 135–145)

## 2020-01-20 MED ORDER — MELOXICAM 15 MG PO TABS: 15.0000 mg | ORAL_TABLET | Freq: Every day | ORAL | 1 refills | Status: DC

## 2020-01-20 MED ORDER — CYCLOBENZAPRINE HCL 10 MG PO TABS: ORAL_TABLET | ORAL | 5 refills | Status: DC

## 2020-01-20 NOTE — Progress Notes (Signed)
Virtual Visit via Video Note  I connected with pt on 01/20/20 at  9:00 AM EDT by a video enabled telemedicine application and verified that I am speaking with the correct person using two identifiers.  Location patient: home Location provider:work or home office Persons participating in the virtual visit: patient, provider  I discussed the limitations of evaluation and management by telemedicine and the availability of in person appointments. The patient expressed understanding and agreed to proceed.   HPI: 69 y/o WF being seen today for 6 mo f/u chronic pain from osteoarthritis. A/P as of last visit: "1) Osteoarthritis multiple sites.  Grad worsening R hip and leg pain. Will check R hip plain films and see if any significant change compared to 2019 films. She'll continue daily NSAID and we'll keep an eye on renal function. She can increase intake of tramadol to a daytime dose plus her nighttime dose. CSC today. PT referral for gait instability and hopefull to help with R hip pain arthritic pain and compensatory muscle pain.  2) Health maintenance exam: Reviewed age and gender appropriate health maintenance issues (prudent diet, regular exercise, health risks of tobacco and excessive alcohol, use of seatbelts, fire alarms in home, use of sunscreen).  Also reviewed age and gender appropriate health screening as well as vaccine recommendations. Vaccines:  Pneumovax 23->given today.  Shingrix->rx sent to pharmacy today.  She has had both covid shots.     Labs: fasting HP ordered. Cervical ca screening: hx of endometrial ca, pt s/p TAH/BSO 5 yrs ago.  F/u with GYN MD. Breast ca screening: next mammo due 09/2019. Osteoporosis screening: hx of osteopenia.  Plan repeat DEXA 10/2019. Colon ca screening: recall 2027"  INTERIM HX: Pt's xray of R hip/pelvis last visit was pretty unremarkable so I referred her to ortho surg for further eval of her acute on chronic R hip and leg pain. Has been doing  PT and this has been helping, now only going once q 3 wks. Left shoulder pain now an issue for the last 7-8 mo, question of some episodes of acute strain mild, some persistent pain lately/gradually-->eval by Dr. Delilah Shan has been done (sports med) and he will be doing ultrasound on shoulder and possibly steroid injection.  Meloxicam daily and flexeril hs helping signif.  Not taking tramadol b/c it makes her itch too much. She feels like this current treatment regimen is sufficiently helpful, w/out adverse side effects.  She takes ASA 81mg  several days per week.  PMP AWARE reviewed today: most recent rx for tramadol was filled 06/30/19, # 30, rx by me. No red flags.  ROS: See pertinent positives and negatives per HPI.  Past Medical History:  Diagnosis Date  . Allergy   . Borderline hyperlipidemia    [place] CV risk 2017= 6%.  . Cataract   . Cerebral palsy ([place])   . Colon polyp, hyperplastic 2004; 12/2015   Recall 12/2025  . Difficulty sleeping   . Endometrial adenocarcinoma ([place]) 09/2013   Dr. Denman George: pt got vaginal brachytherapy via rad onc.  No sign of dz recurrence as of 02/2016 Gyn Onc f/u.  Next f/u with them is 6 mo.  . Morton's neuroma 2012   Dr. Roseanne Reno did injections in the past (left foot)  . Osteoarthritis    LB, HIPs, knees (L knee end stage), hands  . Osteopenia    DEXA 10/2017 was -1.5.  10/2019 T score -1.9.  Rpt 2 yrs.  . Radiation 12/16/13, 12/22/13, 12/30/13, 01/06/14, 01/13/14  proximal vagina 30 gray  . Right hip pain 2021   more radicular, like DDD etiology->PT, [facility]  . Seasonal allergic rhinitis     Past Surgical History:  Procedure Laterality Date  . COLONOSCOPY  09/2002; 12/2015   2004 hyperplastic; recall sent 2009 but pt apparently didn't respond.  12/2015: one sessile serrated polyp w/out cytologic atypia: recall 10 yrs per GI.  Marland Kitchen DEXA  08/2007; 11/14/15; 10/2017;10/2019   2009 T score -1.2.  2017 T score -1.6.  2019 T score -1.5. 10/2019 0-1.9.  Rpt 2  yrs  . DILATION AND CURETTAGE OF UTERUS    . LEG SURGERY Left 01/2014   "cleaned out cartilage"  . ROBOTIC ASSISTED TOTAL HYSTERECTOMY WITH BILATERAL SALPINGO OOPHERECTOMY Bilateral 10/05/2013   Procedure: ROBOTIC ASSISTED TOTAL HYSTERECTOMY WITH BILATERAL SALPINGO OOPHORECTOMY WITH LYMPH NODE DISECTION;  Surgeon: Everitt Amber, MD;  Location: [facility];  Service: Gynecology;  Laterality: Bilateral;  . TONSILLECTOMY    . TOTAL KNEE ARTHROPLASTY Left 12/07/2014   Procedure: TOTAL KNEE ARTHROPLASTY;  Surgeon: Latanya Maudlin, MD;  Location: [facility];  Service: Orthopedics;  Laterality: Left;  . TUBAL LIGATION       Current Outpatient Medications:  .  aspirin 81 MG tablet, Take 81 mg by mouth 2 (two) times a week., Disp: , Rfl:  .  beta carotene w/minerals (OCUVITE) tablet, Take 1 tablet by mouth every morning. Reported on 06/01/2015, Disp: , Rfl:  .  CALCIUM PO, Take 1 tablet by mouth daily., Disp: , Rfl:  .  cetirizine (ZYRTEC) 10 MG tablet, Take 10 mg by mouth daily as needed for allergies. Reported on 06/01/2015, Disp: , Rfl:  .  Cholecalciferol (VITAMIN D PO), Take 1 tablet by mouth daily., Disp: , Rfl:  .  CRANBERRY PO, Take 4,200 mg by mouth. Take 2 capsules daily., Disp: , Rfl:  .  cyclobenzaprine (FLEXERIL) 10 MG tablet, Take 1 tablet (10 mg total) by mouth 3 (three) times daily as needed for muscle spasms., Disp: 30 tablet, Rfl: 0 .  docusate sodium (COLACE) 100 MG capsule, Take 100 mg by mouth daily as needed for mild constipation. Reported on 06/01/2015, Disp: , Rfl:  .  meloxicam (MOBIC) 15 MG tablet, TAKE ONE TABLET BY MOUTH EVERY DAY, Disp: 30 tablet, Rfl: 1 .  pantoprazole (PROTONIX) 40 MG tablet, TAKE ONE TABLET BY MOUTH DAILY, Disp: 90 tablet, Rfl: 3 .  polyethylene glycol (MIRALAX / GLYCOLAX) packet, Take 17 g by mouth daily as needed for mild constipation. Reported on 06/01/2015, Disp: , Rfl:  .  Probiotic Product (PROBIOTIC PO), Take 1 tablet by mouth every morning. Reported on 06/01/2015,  Disp: , Rfl:  .  traMADol (ULTRAM) 50 MG tablet, TAKE 1-2 TABLETS BY MOUTH THREE TIMES DAILY AS NEEDED FOR PAIN, Disp: 30 tablet, Rfl: 0 .  vitamin B-12 (CYANOCOBALAMIN) 1000 MCG tablet, Take 1,000 mcg by mouth daily., Disp: , Rfl:   EXAM:  VITALS per patient if applicable:  Vitals with BMI 07/15/2019 11/16/2018 05/18/2018  Height 5\' 0"  5\' 0"  5\' 0"   Weight 146 lbs 13 oz 148 lbs 2 oz 148 lbs 13 oz  BMI 28.67 16.10 96.04  Systolic 540 981 191  Diastolic 77 86 71  Pulse 68 75 72     GENERAL: alert, oriented, appears well and in no acute distress  HEENT: atraumatic, conjunttiva clear, no obvious abnormalities on inspection of external nose and ears  NECK: normal movements of the head and neck  LUNGS: on inspection no signs  of respiratory distress, breathing rate appears normal, no obvious gross SOB, gasping or wheezing  CV: no obvious cyanosis  MS: moves all visible extremities without noticeable abnormality  PSYCH/NEURO: pleasant and cooperative, no obvious depression or anxiety, speech and thought processing grossly intact  LABS: none today  Lab Results  Component Value Date   TSH 3.40 07/15/2019   Lab Results  Component Value Date   WBC 5.7 07/15/2019   HGB 14.8 07/15/2019   HCT 44.4 07/15/2019   MCV 90.7 07/15/2019   PLT 294.0 07/15/2019   Lab Results  Component Value Date   CREATININE 0.71 07/15/2019   BUN 21 07/15/2019   NA 140 07/15/2019   K 4.4 07/15/2019   CL 103 07/15/2019   CO2 31 07/15/2019   Lab Results  Component Value Date   ALT 14 07/15/2019   AST 17 07/15/2019   ALKPHOS 60 07/15/2019   BILITOT 0.6 07/15/2019   Lab Results  Component Value Date   CHOL 213 (H) 07/15/2019   Lab Results  Component Value Date   HDL 51.70 07/15/2019   Lab Results  Component Value Date   LDLCALC 140 (H) 07/15/2019   Lab Results  Component Value Date   TRIG 106.0 07/15/2019   Lab Results  Component Value Date   CHOLHDL 4 07/15/2019   Lab Results   Component Value Date   HGBA1C 5.8 08/09/2019   ASSESSMENT AND PLAN:  Discussed the following assessment and plan:  1) Musculoskeletal pain: for the most part this is LB and R hip in etiology, x-rays pretty unremarkable. PT helpful.  Will continue meloxicam 15mg  qd and keep an eye on renal function (lab visit to be arranged for BMET).  Continue flexeril 10mg  bid prn.  She takes pantoprazole 40mg  qd. Stop daily ASA-->risk outweighs benefit in this pt. We'll take tramadol off her med list b/c this med causes too much itching.  She'll be following up with Dr. Delilah Shan for u/s and further treatment of her shoulder.  -we discussed possible serious and likely etiologies, options for evaluation and workup, limitations of telemedicine visit vs in person visit, treatment, treatment risks and precautions. Pt prefers to treat via telemedicine empirically rather than in person at this moment.    I discussed the assessment and treatment plan with the patient. The patient was provided an opportunity to ask questions and all were answered. The patient agreed with the plan and demonstrated an understanding of the instructions.   F/u: 6 mo cpe  Signed:  Crissie Sickles, MD           01/20/2020

## 2020-02-21 ENCOUNTER — Telehealth: Payer: Self-pay | Admitting: Family Medicine

## 2020-02-21 DIAGNOSIS — M25512 Pain in left shoulder: Secondary | ICD-10-CM | POA: Diagnosis not present

## 2020-02-21 NOTE — Telephone Encounter (Signed)
Left message for patient to schedule Annual Wellness Visit.  Please schedule with Nurse Health Advisor Martha Stanley, RN at [facility]  °

## 2020-03-08 NOTE — Telephone Encounter (Signed)
error 

## 2020-04-07 DIAGNOSIS — R531 Weakness: Secondary | ICD-10-CM | POA: Diagnosis not present

## 2020-04-07 DIAGNOSIS — M25512 Pain in left shoulder: Secondary | ICD-10-CM | POA: Diagnosis not present

## 2020-04-17 DIAGNOSIS — R531 Weakness: Secondary | ICD-10-CM | POA: Diagnosis not present

## 2020-04-17 DIAGNOSIS — M25512 Pain in left shoulder: Secondary | ICD-10-CM | POA: Diagnosis not present

## 2020-05-05 DIAGNOSIS — R531 Weakness: Secondary | ICD-10-CM | POA: Diagnosis not present

## 2020-05-05 DIAGNOSIS — M25512 Pain in left shoulder: Secondary | ICD-10-CM | POA: Diagnosis not present

## 2020-05-12 DIAGNOSIS — R531 Weakness: Secondary | ICD-10-CM | POA: Diagnosis not present

## 2020-05-12 DIAGNOSIS — M25512 Pain in left shoulder: Secondary | ICD-10-CM | POA: Diagnosis not present

## 2020-06-01 DIAGNOSIS — M5432 Sciatica, left side: Secondary | ICD-10-CM | POA: Diagnosis not present

## 2020-06-01 DIAGNOSIS — M9903 Segmental and somatic dysfunction of lumbar region: Secondary | ICD-10-CM | POA: Diagnosis not present

## 2020-06-06 DIAGNOSIS — M5432 Sciatica, left side: Secondary | ICD-10-CM | POA: Diagnosis not present

## 2020-06-06 DIAGNOSIS — M9903 Segmental and somatic dysfunction of lumbar region: Secondary | ICD-10-CM | POA: Diagnosis not present

## 2020-06-08 DIAGNOSIS — M5432 Sciatica, left side: Secondary | ICD-10-CM | POA: Diagnosis not present

## 2020-06-08 DIAGNOSIS — M9903 Segmental and somatic dysfunction of lumbar region: Secondary | ICD-10-CM | POA: Diagnosis not present

## 2020-06-13 DIAGNOSIS — M9903 Segmental and somatic dysfunction of lumbar region: Secondary | ICD-10-CM | POA: Diagnosis not present

## 2020-06-13 DIAGNOSIS — M5432 Sciatica, left side: Secondary | ICD-10-CM | POA: Diagnosis not present

## 2020-06-15 DIAGNOSIS — M5432 Sciatica, left side: Secondary | ICD-10-CM | POA: Diagnosis not present

## 2020-06-15 DIAGNOSIS — M9903 Segmental and somatic dysfunction of lumbar region: Secondary | ICD-10-CM | POA: Diagnosis not present

## 2020-06-19 ENCOUNTER — Other Ambulatory Visit: Payer: Self-pay | Admitting: Family Medicine

## 2020-06-20 DIAGNOSIS — M9903 Segmental and somatic dysfunction of lumbar region: Secondary | ICD-10-CM | POA: Diagnosis not present

## 2020-06-20 DIAGNOSIS — M5432 Sciatica, left side: Secondary | ICD-10-CM | POA: Diagnosis not present

## 2020-06-21 DIAGNOSIS — M9903 Segmental and somatic dysfunction of lumbar region: Secondary | ICD-10-CM | POA: Diagnosis not present

## 2020-06-21 DIAGNOSIS — M5432 Sciatica, left side: Secondary | ICD-10-CM | POA: Diagnosis not present

## 2020-06-27 DIAGNOSIS — M5432 Sciatica, left side: Secondary | ICD-10-CM | POA: Diagnosis not present

## 2020-06-27 DIAGNOSIS — M9903 Segmental and somatic dysfunction of lumbar region: Secondary | ICD-10-CM | POA: Diagnosis not present

## 2020-07-04 DIAGNOSIS — M5432 Sciatica, left side: Secondary | ICD-10-CM | POA: Diagnosis not present

## 2020-07-04 DIAGNOSIS — M9903 Segmental and somatic dysfunction of lumbar region: Secondary | ICD-10-CM | POA: Diagnosis not present

## 2020-07-10 DIAGNOSIS — M9903 Segmental and somatic dysfunction of lumbar region: Secondary | ICD-10-CM | POA: Diagnosis not present

## 2020-07-10 DIAGNOSIS — M5432 Sciatica, left side: Secondary | ICD-10-CM | POA: Diagnosis not present

## 2020-07-13 DIAGNOSIS — M9903 Segmental and somatic dysfunction of lumbar region: Secondary | ICD-10-CM | POA: Diagnosis not present

## 2020-07-13 DIAGNOSIS — M5432 Sciatica, left side: Secondary | ICD-10-CM | POA: Diagnosis not present

## 2020-07-19 DIAGNOSIS — M9903 Segmental and somatic dysfunction of lumbar region: Secondary | ICD-10-CM | POA: Diagnosis not present

## 2020-07-19 DIAGNOSIS — M5432 Sciatica, left side: Secondary | ICD-10-CM | POA: Diagnosis not present

## 2020-07-25 DIAGNOSIS — M9903 Segmental and somatic dysfunction of lumbar region: Secondary | ICD-10-CM | POA: Diagnosis not present

## 2020-07-25 DIAGNOSIS — M5432 Sciatica, left side: Secondary | ICD-10-CM | POA: Diagnosis not present

## 2020-08-10 NOTE — Progress Notes (Signed)
Office Note 08/11/2020  CC:  Chief Complaint  Patient presents with  . Annual Exam    Pt is fasting    HPI:  Kelsey Bruce is a 70 y.o. White female who is here for annual health maintenance exam. A/P as of last visit (telemed 01/20/20): "1) Musculoskeletal pain: for the most part this is LB and R hip in etiology, x-rays pretty unremarkable. PT helpful.  Will continue meloxicam 15mg  qd and keep an eye on renal function (lab visit to be arranged for BM[facility]).  Continue flexeril 10mg  bid prn.  She takes pantoprazole 40mg  qd. Stop daily ASA-->risk outweighs benefit in this pt. We'll take tramadol off her med list b/c this med causes too much itching.  She'll be following up with Dr. Delilah Shan for u/s and further treatment of her shoulder."  INTERIM HX: She finished formal PT, does exercises at home. Chiropracter has helped her with back pain as well.  "Every now and then" over the last month or so she has R LL pain in anterior aspect, some tight feeling in anterolateral aspect of LL at these times, occurs about qod and on the days in between sx's she has absolutely no prob with leg.  No leg swelling or skin/color changes.  Doesn't recall any preceding injury.  No hx of DVT.  Taking meloxicam daily for osteoarthritis pain in hips, knees, back.  Past Medical History:  Diagnosis Date  . Allergy   . Borderline hyperlipidemia    [place] CV risk 2017= 6%.  . Cataract   . Cerebral palsy ([place])   . Colon polyp, hyperplastic 2004; 12/2015   Recall 12/2025  . Difficulty sleeping   . Endometrial adenocarcinoma ([facility]) 09/2013   Dr. Denman George: pt got vaginal brachytherapy via rad onc.  No sign of dz recurrence as of 02/2016 Gyn Onc f/u.  Next f/u with them is 6 mo.  . Morton's neuroma 2012   Dr. Roseanne Reno did injections in the past (left foot)  . Osteoarthritis    LB, HIPs, knees (L knee end stage), hands  . Osteopenia    DEXA 10/2017 was -1.5.  10/2019 T score -1.9.  Rpt 2 yrs.  .  Radiation 12/16/13, 12/22/13, 12/30/13, 01/06/14, 01/13/14   proximal vagina 30 gray  . Right hip pain 2021   more radicular, like DDD etiology->PT, [facility]  . Seasonal allergic rhinitis     Past Surgical History:  Procedure Laterality Date  . COLONOSCOPY  09/2002; 12/2015   2004 hyperplastic; recall sent 2009 but pt apparently didn't respond.  12/2015: one sessile serrated polyp w/out cytologic atypia: recall 10 yrs per GI.  Marland Kitchen DEXA  08/2007; 11/14/15; 10/2017;10/2019   2009 T score -1.2.  2017 T score -1.6.  2019 T score -1.5. 10/2019 0-1.9.  Rpt 2 yrs  . DILATION AND CURETTAGE OF UTERUS    . LEG SURGERY Left 01/2014   "cleaned out cartilage"  . ROBOTIC ASSISTED TOTAL HYSTERECTOMY WITH BILATERAL SALPINGO OOPHERECTOMY Bilateral 10/05/2013   Procedure: ROBOTIC ASSISTED TOTAL HYSTERECTOMY WITH BILATERAL SALPINGO OOPHORECTOMY WITH LYMPH NODE DISECTION;  Surgeon: Everitt Amber, MD;  Location: [facility];  Service: Gynecology;  Laterality: Bilateral;  . TONSILLECTOMY    . TOTAL KNEE ARTHROPLASTY Left 12/07/2014   Procedure: TOTAL KNEE ARTHROPLASTY;  Surgeon: Latanya Maudlin, MD;  Location: [facility];  Service: Orthopedics;  Laterality: Left;  . TUBAL LIGATION      Family History  Problem Relation Age of Onset  . Heart disease Father   . Hypertension  Father   . Cancer Father        lung  . Arthritis Father   . Alcohol abuse Father   . Heart disease Mother   . Cancer Mother        lung  . Deep vein thrombosis Mother   . Alcohol abuse Mother   . Heart disease Brother   . Hypertension Brother   . Heart disease Brother   . Hypertension Brother   . Heart disease Brother   . Hypertension Brother   . Stomach cancer Maternal Grandfather   . Colon cancer Neg Hx   . Esophageal cancer Neg Hx   . Rectal cancer Neg Hx     Social History   Socioeconomic History  . Marital status: Divorced    Spouse name: Not on file  . Number of children: 2  . Years of education: Not on file  . Highest education  level: Not on file  Occupational History  . Occupation: retired  Tobacco Use  . Smoking status: Never Smoker  . Smokeless tobacco: Never Used  Vaping Use  . Vaping Use: Never used  Substance and Sexual Activity  . Alcohol use: No    Comment: occasional  . Drug use: No  . Sexual activity: Not on file  Other Topics Concern  . Not on file  Social History Narrative   Divorced.  Two children.   Orig from [place], [place].   HS education.   Retired.   No tobacco, occ alcohol, no drugs.   Has 3 brothers and they all smoked, as did her parents.   Social Determinants of Health   Financial Resource Strain: Not on file  Food Insecurity: Not on file  Transportation Needs: Not on file  Physical Activity: Not on file  Stress: Not on file  Social Connections: Not on file  Intimate Partner Violence: Not on file    Outpatient Medications Prior to Visit  Medication Sig Dispense Refill  . beta carotene w/minerals (OCUVITE) tablet Take 1 tablet by mouth every morning. Reported on 06/01/2015    . CALCIUM PO Take 1 tablet by mouth daily.    . cetirizine (ZYRTEC) 10 MG tablet Take 10 mg by mouth daily as needed for allergies. Reported on 06/01/2015    . Cholecalciferol (VITAMIN D PO) Take 1 tablet by mouth daily.    Marland Kitchen CRANBERRY PO Take 4,200 mg by mouth. Take 2 capsules daily.    . cyclobenzaprine (FLEXERIL) 10 MG tablet 1 tab po bid prn for musculoskeletal pain 30 tablet 5  . docusate sodium (COLACE) 100 MG capsule Take 100 mg by mouth daily as needed for mild constipation. Reported on 06/01/2015    . meloxicam (MOBIC) 15 MG tablet Take 1 tablet (15 mg total) by mouth daily. 90 tablet 1  . pantoprazole (PROTONIX) 40 MG tablet TAKE ONE TABLET BY MOUTH DAILY 90 tablet 3  . polyethylene glycol (MIRALAX / GLYCOLAX) packet Take 17 g by mouth daily as needed for mild constipation. Reported on 06/01/2015    . Probiotic Product (PROBIOTIC PO) Take 1 tablet by mouth every morning. Reported on 06/01/2015    .  vitamin B-12 (CYANOCOBALAMIN) 1000 MCG tablet Take 1,000 mcg by mouth daily.     No facility-administered medications prior to visit.    Allergies  Allergen Reactions  . Aspirin Nausea Only    High dose only  . Seasonal Ic [Cholestatin]    ROS Review of Systems  Constitutional: Negative for appetite change, chills, fatigue  and fever.  HENT: Negative for congestion, dental problem, ear pain and sore throat.   Eyes: Negative for discharge, redness and visual disturbance.  Respiratory: Negative for cough, chest tightness, shortness of breath and wheezing.   Cardiovascular: Negative for chest pain, palpitations and leg swelling.  Gastrointestinal: Negative for abdominal pain, blood in stool, diarrhea, nausea and vomiting.  Genitourinary: Negative for difficulty urinating, dysuria, flank pain, frequency, hematuria and urgency.  Musculoskeletal: Negative for arthralgias, back pain, joint swelling, myalgias and neck stiffness.  Skin: Negative for pallor and rash.  Neurological: Negative for dizziness, speech difficulty, weakness and headaches.  Hematological: Negative for adenopathy. Does not bruise/bleed easily.  Psychiatric/Behavioral: Negative for confusion and sleep disturbance. The patient is not nervous/anxious.     PE; Vitals with BMI 08/11/2020 07/15/2019 11/16/2018  Height 5\' 0"  5\' 0"  5\' 0"   Weight 145 lbs 146 lbs 13 oz 148 lbs 2 oz  BMI 28.32 09.38 18.29  Systolic 937 169 678  Diastolic 80 77 86  Pulse 76 68 75     Gen: Alert, well appearing.  Patient is oriented to person, place, time, and situation. AFFECT: pleasant, lucid thought and speech. ENT: Ears: EACs clear, normal epithelium.  TMs with good light reflex and landmarks bilaterally.  Eyes: no injection, icteris, swelling, or exudate.  EOMI, PERRLA. Nose: no drainage or turbinate edema/swelling.  No injection or focal lesion.  Mouth: lips without lesion/swelling.  Oral mucosa pink and moist.  Dentition intact and  without obvious caries or gingival swelling.  Oropharynx without erythema, exudate, or swelling.  Neck: supple/nontender.  No LAD, mass, or TM.  Carotid pulses 2+ bilaterally, without bruits. CV: RRR, no m/r/g.   LUNGS: CTA bilat, nonlabored resps, good aeration in all lung fields. ABD: soft, NT, ND, BS normal.  No hepatospenomegaly or mass.  No bruits. EXT: no clubbing, cyanosis, or edema.  R LL with normal appearance of skin.  No asymmetry of LL's (calf measurement today 34 cm each side), no edema and no tenderness. Musculoskeletal: no joint swelling, erythema, warmth, or tenderness.  ROM of all joints intact. Skin - no sores or suspicious lesions or rashes or color changes   Pertinent labs:  Lab Results  Component Value Date   TSH 3.40 07/15/2019   Lab Results  Component Value Date   WBC 5.7 07/15/2019   HGB 14.8 07/15/2019   HCT 44.4 07/15/2019   MCV 90.7 07/15/2019   PLT 294.0 07/15/2019   Lab Results  Component Value Date   CREATININE 0.74 01/20/2020   BUN 18 01/20/2020   NA 139 01/20/2020   K 4.2 01/20/2020   CL 104 01/20/2020   CO2 29 01/20/2020   Lab Results  Component Value Date   ALT 14 07/15/2019   AST 17 07/15/2019   ALKPHOS 60 07/15/2019   BILITOT 0.6 07/15/2019   Lab Results  Component Value Date   CHOL 213 (H) 07/15/2019   Lab Results  Component Value Date   HDL 51.70 07/15/2019   Lab Results  Component Value Date   LDLCALC 140 (H) 07/15/2019   Lab Results  Component Value Date   TRIG 106.0 07/15/2019   Lab Results  Component Value Date   CHOLHDL 4 07/15/2019   Lab Results  Component Value Date   HGBA1C 5.8 08/09/2019   ASSESSMENT AND PLAN:   1) RLL pain; no symptom or sign worrisome for DVT.  Reassured pt that this is likely musculoskeletal pain.  2) Osteoarthritis multiple sites.  Has some  chronic gait instability issues that PT has helped. Cont daily meloxicam and regular monitoring of kidney function.  Cont home self directed  PT. Lytes/cr today.  3) Health maintenance exam: Reviewed age and gender appropriate health maintenance issues (prudent diet, regular exercise, health risks of tobacco and excessive alcohol, use of seatbelts, fire alarms in home, use of sunscreen).  Also reviewed age and gender appropriate health screening as well as vaccine recommendations. Vaccines: Prevnar 20 indicated->given today.  Sent shingrix rx to pharmacy at last cpe 07/2019->will send rx again today. Labs: cmet, cbc, flp. Cervical ca screening: hx of endometrial ca, pt s/p TAH/BSO 5 yrs ago.  F/u with GYN MD. Breast ca screening: next mammo due 10/2020. Osteoporosis screening: hx of osteopenia.  Plan repeat DEXA 10/2021. Colon ca screening: recall 2027. She takes ca and vit D supp daily.   An After Visit Summary was printed and given to the patient.  FOLLOW UP:  No follow-ups on file.  Signed:  Crissie Sickles, MD           08/11/2020

## 2020-08-11 ENCOUNTER — Encounter: Payer: Self-pay | Admitting: Family Medicine

## 2020-08-11 ENCOUNTER — Other Ambulatory Visit: Payer: Self-pay

## 2020-08-11 ENCOUNTER — Ambulatory Visit (INDEPENDENT_AMBULATORY_CARE_PROVIDER_SITE_OTHER): Payer: Medicare Other | Admitting: Family Medicine

## 2020-08-11 VITALS — BP 127/80 | HR 76 | Temp 97.6°F | Resp 16 | Ht 60.0 in | Wt 145.0 lb

## 2020-08-11 DIAGNOSIS — Z0000 Encounter for general adult medical examination without abnormal findings: Secondary | ICD-10-CM

## 2020-08-11 DIAGNOSIS — M159 Polyosteoarthritis, unspecified: Secondary | ICD-10-CM

## 2020-08-11 DIAGNOSIS — Z23 Encounter for immunization: Secondary | ICD-10-CM | POA: Diagnosis not present

## 2020-08-11 DIAGNOSIS — M79604 Pain in right leg: Secondary | ICD-10-CM

## 2020-08-11 DIAGNOSIS — M8949 Other hypertrophic osteoarthropathy, multiple sites: Secondary | ICD-10-CM

## 2020-08-11 DIAGNOSIS — Z5181 Encounter for therapeutic drug level monitoring: Secondary | ICD-10-CM

## 2020-08-11 DIAGNOSIS — Z791 Long term (current) use of non-steroidal anti-inflammatories (NSAID): Secondary | ICD-10-CM

## 2020-08-11 DIAGNOSIS — E785 Hyperlipidemia, unspecified: Secondary | ICD-10-CM | POA: Diagnosis not present

## 2020-08-11 MED ORDER — ZOSTER VAC RECOMB ADJUVANTED 50 MCG/0.5ML IM SUSR: 0.5000 mL | Freq: Once | INTRAMUSCULAR | 1 refills | Status: AC

## 2020-08-11 NOTE — Patient Instructions (Signed)
Health Maintenance, Female Adopting a healthy lifestyle and getting preventive care are important in promoting health and wellness. Ask your health care provider about:  The right schedule for you to have regular tests and exams.  Things you can do on your own to prevent diseases and keep yourself healthy. What should I know about diet, weight, and exercise? Eat a healthy diet  Eat a diet that includes plenty of vegetables, fruits, low-fat dairy products, and lean protein.  Do not eat a lot of foods that are high in solid fats, added sugars, or sodium.   Maintain a healthy weight Body mass index (BMI) is used to identify weight problems. It estimates body fat based on height and weight. Your health care provider can help determine your BMI and help you achieve or maintain a healthy weight. Get regular exercise Get regular exercise. This is one of the most important things you can do for your health. Most adults should:  Exercise for at least 150 minutes each week. The exercise should increase your heart rate and make you sweat (moderate-intensity exercise).  Do strengthening exercises at least twice a week. This is in addition to the moderate-intensity exercise.  Spend less time sitting. Even light physical activity can be beneficial. Watch cholesterol and blood lipids Have your blood tested for lipids and cholesterol at 69 years of age, then have this test every 5 years. Have your cholesterol levels checked more often if:  Your lipid or cholesterol levels are high.  You are older than 69 years of age.  You are at high risk for heart disease. What should I know about cancer screening? Depending on your health history and family history, you may need to have cancer screening at various ages. This may include screening for:  Breast cancer.  Cervical cancer.  Colorectal cancer.  Skin cancer.  Lung cancer. What should I know about heart disease, diabetes, and high blood  pressure? Blood pressure and heart disease  High blood pressure causes heart disease and increases the risk of stroke. This is more likely to develop in people who have high blood pressure readings, are of African descent, or are overweight.  Have your blood pressure checked: ? Every 3-5 years if you are 18-39 years of age. ? Every year if you are 40 years old or older. Diabetes Have regular diabetes screenings. This checks your fasting blood sugar level. Have the screening done:  Once every three years after age 40 if you are at a normal weight and have a low risk for diabetes.  More often and at a younger age if you are overweight or have a high risk for diabetes. What should I know about preventing infection? Hepatitis B If you have a higher risk for hepatitis B, you should be screened for this virus. Talk with your health care provider to find out if you are at risk for hepatitis B infection. Hepatitis C Testing is recommended for:  Everyone born from 1945 through 1965.  Anyone with known risk factors for hepatitis C. Sexually transmitted infections (STIs)  Get screened for STIs, including gonorrhea and chlamydia, if: ? You are sexually active and are younger than 69 years of age. ? You are older than 69 years of age and your health care provider tells you that you are at risk for this type of infection. ? Your sexual activity has changed since you were last screened, and you are at increased risk for chlamydia or gonorrhea. Ask your health care provider   if you are at risk.  Ask your health care provider about whether you are at high risk for HIV. Your health care provider may recommend a prescription medicine to help prevent HIV infection. If you choose to take medicine to prevent HIV, you should first get tested for HIV. You should then be tested every 3 months for as long as you are taking the medicine. Pregnancy  If you are about to stop having your period (premenopausal) and  you may become pregnant, seek counseling before you get pregnant.  Take 400 to 800 micrograms (mcg) of folic acid every day if you become pregnant.  Ask for birth control (contraception) if you want to prevent pregnancy. Osteoporosis and menopause Osteoporosis is a disease in which the bones lose minerals and strength with aging. This can result in bone fractures. If you are 65 years old or older, or if you are at risk for osteoporosis and fractures, ask your health care provider if you should:  Be screened for bone loss.  Take a calcium or vitamin D supplement to lower your risk of fractures.  Be given hormone replacement therapy (HRT) to treat symptoms of menopause. Follow these instructions at home: Lifestyle  Do not use any products that contain nicotine or tobacco, such as cigarettes, e-cigarettes, and chewing tobacco. If you need help quitting, ask your health care provider.  Do not use street drugs.  Do not share needles.  Ask your health care provider for help if you need support or information about quitting drugs. Alcohol use  Do not drink alcohol if: ? Your health care provider tells you not to drink. ? You are pregnant, may be pregnant, or are planning to become pregnant.  If you drink alcohol: ? Limit how much you use to 0-1 drink a day. ? Limit intake if you are breastfeeding.  Be aware of how much alcohol is in your drink. In the [place] one drink equals one 12 oz bottle of beer (355 mL), one 5 oz glass of wine (148 mL), or one 1 oz glass of hard liquor (44 mL). General instructions  Schedule regular health, dental, and eye exams.  Stay current with your vaccines.  Tell your health care provider if: ? You often feel depressed. ? You have ever been abused or do not feel safe at home. Summary  Adopting a healthy lifestyle and getting preventive care are important in promoting health and wellness.  Follow your health care provider's instructions about healthy  diet, exercising, and getting tested or screened for diseases.  Follow your health care provider's instructions on monitoring your cholesterol and blood pressure. This information is not intended to replace advice given to you by your health care provider. Make sure you discuss any questions you have with your health care provider. Document Revised: 03/04/2018 Document Reviewed: 03/04/2018 Elsevier Patient Education  2021 [employer]  

## 2020-08-12 LAB — CBC WITH DIFFERENTIAL/PLATELET
Absolute Monocytes: 807 cells/uL (ref 200–950)
Basophils Absolute: 59 cells/uL (ref 0–200)
Basophils Relative: 0.8 %
Eosinophils Absolute: 229 cells/uL (ref 15–500)
Eosinophils Relative: 3.1 %
HCT: 44.7 % (ref 35.0–45.0)
Hemoglobin: 14.4 g/dL (ref 11.7–15.5)
Lymphs Abs: 1561 cells/uL (ref 850–3900)
MCH: 29.1 pg (ref 27.0–33.0)
MCHC: 32.2 g/dL (ref 32.0–36.0)
MCV: 90.5 fL (ref 80.0–100.0)
MPV: 10 fL (ref 7.5–12.5)
Monocytes Relative: 10.9 %
Neutro Abs: 4743 cells/uL (ref 1500–7800)
Neutrophils Relative %: 64.1 %
Platelets: 261 10*3/uL (ref 140–400)
RBC: 4.94 10*6/uL (ref 3.80–5.10)
RDW: 12.6 % (ref 11.0–15.0)
Total Lymphocyte: 21.1 %
WBC: 7.4 10*3/uL (ref 3.8–10.8)

## 2020-08-12 LAB — LIPID PANEL
Cholesterol: 164 mg/dL (ref <200)
HDL: 47 mg/dL — ABNORMAL LOW (ref >=50)
LDL Cholesterol (Calc): 100 mg/dL (calc) — ABNORMAL HIGH
Non-HDL Cholesterol (Calc): 117 mg/dL (calc) (ref <130)
Total CHOL/HDL Ratio: 3.5 (calc) (ref <5.0)
Triglycerides: 77 mg/dL (ref <150)

## 2020-08-12 LAB — COMPREHENSIVE METABOLIC PANEL
AG Ratio: 1.4 (calc) (ref 1.0–2.5)
ALT: 17 U/L (ref 6–29)
AST: 22 U/L (ref 10–35)
Albumin: 3.9 g/dL (ref 3.6–5.1)
Alkaline phosphatase (APISO): 81 U/L (ref 37–153)
BUN: 21 mg/dL (ref 7–25)
CO2: 24 mmol/L (ref 20–32)
Calcium: 9.1 mg/dL (ref 8.6–10.4)
Chloride: 107 mmol/L (ref 98–110)
Creat: 0.77 mg/dL (ref 0.50–0.99)
Globulin: 2.7 g/dL (calc) (ref 1.9–3.7)
Glucose, Bld: 98 mg/dL (ref 65–99)
Potassium: 4.8 mmol/L (ref 3.5–5.3)
Sodium: 142 mmol/L (ref 135–146)
Total Bilirubin: 0.5 mg/dL (ref 0.2–1.2)
Total Protein: 6.6 g/dL (ref 6.1–8.1)

## 2020-08-15 DIAGNOSIS — M5432 Sciatica, left side: Secondary | ICD-10-CM | POA: Diagnosis not present

## 2020-08-15 DIAGNOSIS — M9903 Segmental and somatic dysfunction of lumbar region: Secondary | ICD-10-CM | POA: Diagnosis not present

## 2020-08-16 DIAGNOSIS — H353131 Nonexudative age-related macular degeneration, bilateral, early dry stage: Secondary | ICD-10-CM | POA: Diagnosis not present

## 2020-08-29 DIAGNOSIS — M9903 Segmental and somatic dysfunction of lumbar region: Secondary | ICD-10-CM | POA: Diagnosis not present

## 2020-08-29 DIAGNOSIS — M5432 Sciatica, left side: Secondary | ICD-10-CM | POA: Diagnosis not present

## 2020-09-12 DIAGNOSIS — M9903 Segmental and somatic dysfunction of lumbar region: Secondary | ICD-10-CM | POA: Diagnosis not present

## 2020-09-12 DIAGNOSIS — M5432 Sciatica, left side: Secondary | ICD-10-CM | POA: Diagnosis not present

## 2020-09-20 ENCOUNTER — Ambulatory Visit (INDEPENDENT_AMBULATORY_CARE_PROVIDER_SITE_OTHER): Payer: Medicare Other | Admitting: *Deleted

## 2020-09-20 DIAGNOSIS — Z0000 Encounter for general adult medical examination without abnormal findings: Secondary | ICD-10-CM | POA: Diagnosis not present

## 2020-09-20 NOTE — Progress Notes (Signed)
Subjective:   Kelsey Bruce is a 70 y.o. female who presents for Medicare Annual (Subsequent) preventive examination.  I connected with  Rocco Pauls on 09/20/20 by a video enabled telemedicine application and verified that I am speaking with the correct person using two identifiers.   I discussed the limitations of evaluation and management by telemedicine. The patient expressed understanding and agreed to proceed.  Patient location: home  Provider location:telehealth    Review of Systems    na Cardiac Risk Factors include: advanced age (>62men, >59 women)     Objective:    Today's Vitals   09/20/20 1158  PainSc: 3    There is no height or weight on file to calculate BMI.  Advanced Directives 09/20/2020 11/16/2018 05/18/2018 11/13/2017 08/14/2017 08/08/2016 03/04/2016  Does Patient Have a Medical Advance Directive? No No No No No No Yes  Type of Advance Directive - - - - - - -  Does patient want to make changes to medical advance directive? - - - - - - -  Would patient like information on creating a medical advance directive? No - Patient declined - - - - - -  Pre-existing out of facility DNR order (yellow form or pink MOST form) - - - - - - -    Current Medications (verified) Outpatient Encounter Medications as of 09/20/2020  Medication Sig   beta carotene w/minerals (OCUVITE) tablet Take 1 tablet by mouth every morning. Reported on 06/01/2015   CALCIUM PO Take 1 tablet by mouth daily.   cetirizine (ZYRTEC) 10 MG tablet Take 10 mg by mouth daily as needed for allergies. Reported on 06/01/2015   Cholecalciferol (VITAMIN D PO) Take 1 tablet by mouth daily.   CRANBERRY PO Take 4,200 mg by mouth. Take 2 capsules daily.   cyclobenzaprine (FLEXERIL) 10 MG tablet 1 tab po bid prn for musculoskeletal pain   docusate sodium (COLACE) 100 MG capsule Take 100 mg by mouth daily as needed for mild constipation. Reported on 06/01/2015   meloxicam (MOBIC) 15 MG tablet Take 1  tablet (15 mg total) by mouth daily.   pantoprazole (PROTONIX) 40 MG tablet TAKE ONE TABLET BY MOUTH DAILY   polyethylene glycol (MIRALAX / GLYCOLAX) packet Take 17 g by mouth daily as needed for mild constipation. Reported on 06/01/2015   Probiotic Product (PROBIOTIC PO) Take 1 tablet by mouth every morning. Reported on 06/01/2015   vitamin B-12 (CYANOCOBALAMIN) 1000 MCG tablet Take 1,000 mcg by mouth daily.   No facility-administered encounter medications on file as of 09/20/2020.    Allergies (verified) Aspirin and Seasonal ic [cholestatin]   History: Past Medical History:  Diagnosis Date   Allergy    Borderline hyperlipidemia    [place] CV risk 2017= 6%.   Cataract    Cerebral palsy ([place])    Colon polyp, hyperplastic 2004; 12/2015   Recall 12/2025   Difficulty sleeping    Endometrial adenocarcinoma ([place]) 09/2013   Dr. Denman George: pt got vaginal brachytherapy via rad onc.  No sign of dz recurrence as of 02/2016 Gyn Onc f/u.  Next f/u with them is 6 mo.   Morton's neuroma 2012   Dr. Roseanne Reno did injections in the past (left foot)   Osteoarthritis    LB, HIPs, knees (L knee end stage), hands   Osteopenia    DEXA 10/2017 was -1.5.  10/2019 T score -1.9.  Rpt 2 yrs.   Radiation 12/16/13, 12/22/13, 12/30/13, 01/06/14, 01/13/14   proximal vagina 30 gray  Right hip pain 2021   more radicular, like DDD etiology->PT, [facility]   Seasonal allergic rhinitis    Past Surgical History:  Procedure Laterality Date   COLONOSCOPY  09/2002; 12/2015   2004 hyperplastic; recall sent 2009 but pt apparently didn't respond.  12/2015: one sessile serrated polyp w/out cytologic atypia: recall 10 yrs per GI.   DEXA  08/2007; 11/14/15; 10/2017;10/2019   2009 T score -1.2.  2017 T score -1.6.  2019 T score -1.5. 10/2019 0-1.9.  Rpt 2 yrs   DILATION AND CURETTAGE OF UTERUS     LEG SURGERY Left 01/2014   "cleaned out cartilage"   ROBOTIC ASSISTED TOTAL HYSTERECTOMY WITH BILATERAL SALPINGO OOPHERECTOMY Bilateral  10/05/2013   Procedure: ROBOTIC ASSISTED TOTAL HYSTERECTOMY WITH BILATERAL SALPINGO OOPHORECTOMY WITH LYMPH NODE DISECTION;  Surgeon: Everitt Amber, MD;  Location: [facility];  Service: Gynecology;  Laterality: Bilateral;   TONSILLECTOMY     TOTAL KNEE ARTHROPLASTY Left 12/07/2014   Procedure: TOTAL KNEE ARTHROPLASTY;  Surgeon: Latanya Maudlin, MD;  Location: [facility];  Service: Orthopedics;  Laterality: Left;   TUBAL LIGATION     Family History  Problem Relation Age of Onset   Heart disease Father    Hypertension Father    Cancer Father        lung   Arthritis Father    Alcohol abuse Father    Heart disease Mother    Cancer Mother        lung   Deep vein thrombosis Mother    Alcohol abuse Mother    Heart disease Brother    Hypertension Brother    Heart disease Brother    Hypertension Brother    Heart disease Brother    Hypertension Brother    Stomach cancer Maternal Grandfather    Colon cancer Neg Hx    Esophageal cancer Neg Hx    Rectal cancer Neg Hx    Social History   Socioeconomic History   Marital status: Divorced    Spouse name: Not on file   Number of children: 2   Years of education: Not on file   Highest education level: Not on file  Occupational History   Occupation: retired  Tobacco Use   Smoking status: Never   Smokeless tobacco: Never  Vaping Use   Vaping Use: Never used  Substance and Sexual Activity   Alcohol use: No    Comment: occasional   Drug use: No   Sexual activity: Not on file  Other Topics Concern   Not on file  Social History Narrative   Divorced.  Two children.   Orig from [place], [place].   HS education.   Retired.   No tobacco, occ alcohol, no drugs.   Has 3 brothers and they all smoked, as did her parents.   Social Determinants of Health   Financial Resource Strain: Low Risk    Difficulty of Paying Living Expenses: Not very hard  Food Insecurity: No Food Insecurity   Worried About Charity fundraiser in the Last Year: Never true    Ran Out of Food in the Last Year: Never true  Transportation Needs: No Transportation Needs   Lack of Transportation (Medical): No   Lack of Transportation (Non-Medical): No  Physical Activity: Inactive   Days of Exercise per Week: 0 days   Minutes of Exercise per Session: 0 min  Stress: No Stress Concern Present   Feeling of Stress : Not at all  Social Connections: Moderately Integrated  Frequency of Communication with Friends and Family: More than three times a week   Frequency of Social Gatherings with Friends and Family: More than three times a week   Attends Religious Services: More than 4 times per year   Active Member of [employer] or Organizations: Yes   Attends Archivist Meetings: Never   Marital Status: Divorced    Tobacco Counseling Counseling given: Not Answered   Clinical Intake:  Pre-visit preparation completed: Yes  Pain : 0-10 Pain Score: 3  Pain Location: Knee Pain Descriptors / Indicators: Aching, Constant Pain Onset: More than a month ago Pain Frequency: Intermittent     Diabetes: No  How often do you need to have someone help you when you read instructions, pamphlets, or other written materials from your doctor or pharmacy?: 1 - Never  Diabetic?no  Interpreter Needed?: No  Information entered by :: Almyra Free Adger Cantera LPN   Activities of Daily Living In your present state of health, do you have any difficulty performing the following activities: 09/20/2020 01/20/2020  Hearing? N N  Vision? N N  Difficulty concentrating or making decisions? N N  Walking or climbing stairs? N Y  Dressing or bathing? N N  Doing errands, shopping? N N  Preparing Food and eating ? N -  Using the Toilet? N -  In the past six months, have you accidently leaked urine? Y -  Comment wears pad -  Do you have problems with loss of bowel control? N -  Managing your Medications? N -  Managing your Finances? N -  Housekeeping or managing your Housekeeping? N -  Some  recent data might be hidden    Patient Care Team: Tammi Sou, MD as PCP - General (Family Medicine) Irene Shipper, MD as Consulting Physician (Gastroenterology) Emily Filbert, MD (Inactive) as Consulting Physician (Obstetrics and Gynecology) Everitt Amber, MD as Consulting Physician (Obstetrics and Gynecology) Latanya Maudlin, MD as Consulting Physician (Orthopedic Surgery) Gean Birchwood, DPM as Consulting Physician (Podiatry)  Indicate any recent Medical Services you may have received from other than [facility] providers in the past year (date may be approximate).     Assessment:   This is a routine wellness examination for Zylie.  Hearing/Vision screen Hearing Screening - Comments:: No Hearing problems Vision Screening - Comments:: Dr. Laurance Flatten [place] Up to Date  Dietary issues and exercise activities discussed: Current Exercise Habits: Home exercise routine, Type of exercise: strength training/weights, Time (Minutes): 30, Frequency (Times/Week): 4, Weekly Exercise (Minutes/Week): 120, Intensity: Mild   Goals Addressed             This Visit's Progress    Patient Stated       Better balance         Depression Screen PHQ 2/9 Scores 08/11/2020 07/15/2019 07/21/2017 05/08/2017 08/08/2016 06/01/2015 12/16/2013  PHQ - 2 Score 0 1 0 0 0 0 0    Fall Risk Fall Risk  09/20/2020 08/11/2020 07/15/2019 08/14/2017 07/21/2017  Falls in the past year? 1 1 0 No Yes  Comment - - - - -  Number falls in past yr: 1 1 0 - 2 or more  Injury with Fall? 0 0 0 - No  Comment - - - - -  Risk Factor Category  - - - - -  Risk for fall due to : - - - - -  Follow up Falls evaluation completed;Falls prevention discussed Falls evaluation completed Falls evaluation completed - Falls evaluation completed  FALL RISK PREVENTION PERTAINING TO THE HOME:  Any stairs in or around the home? No  If so, are there any without handrails? No  Home free of loose throw rugs in walkways, pet beds, electrical  cords, etc? Yes  Adequate lighting in your home to reduce risk of falls? Yes   ASSISTIVE DEVICES UTILIZED TO PREVENT FALLS:  Life alert? No  Use of a cane, walker or w/c? No  Grab bars in the bathroom? No  Shower chair or bench in shower? No  Elevated toilet seat or a handicapped toilet? No   TIMED UP AND GO:  Was the test performed? No .   Telehealth    Cognitive Function:        Immunizations Immunization History  Administered Date(s) Administered   Fluad Quad(high Dose 65+) 01/20/2020   Influenza, High Dose Seasonal PF 12/23/2017, 01/12/2019   PFIZER(Purple Top)SARS-COV-2 Vaccination 06/02/2019, 06/30/2019, 04/06/2020   PNEUMOCOCCAL CONJUGATE-20 08/11/2020   Pneumococcal Polysaccharide-23 07/15/2019   Tdap 09/30/2014    TDAP status: Up to date  Flu Vaccine status: Up to date  Pneumococcal vaccine status: Up to date  Covid-19 vaccine status: Information provided on how to obtain vaccines.   Qualifies for Shingles Vaccine? Yes   Zostavax completed No   Shingrix Completed?: No.    Education has been provided regarding the importance of this vaccine. Patient has been advised to call insurance company to determine out of pocket expense if they have not yet received this vaccine. Advised may also receive vaccine at local pharmacy or Health Dept. Verbalized acceptance and understanding.  Screening Tests Health Maintenance  Topic Date Due   Zoster Vaccines- Shingrix (1 of 2) Never done   COVID-19 Vaccine (4 - Booster for Pfizer series) 07/05/2020   INFLUENZA VACCINE  10/23/2020   MAMMOGRAM  11/21/2020   COLONOSCOPY (Pts 45-103yrs Insurance coverage will need to be confirmed)  01/03/2021   PNA vac Low Risk Adult (2 of 2 - PCV13) 08/11/2021   TETANUS/TDAP  09/29/2024   DEXA SCAN  Completed   Hepatitis C Screening  Completed   HPV VACCINES  Aged Out    Health Maintenance  Health Maintenance Due  Topic Date Due   Zoster Vaccines- Shingrix (1 of 2) Never done    COVID-19 Vaccine (4 - Booster for Pfizer series) 07/05/2020    Colorectal cancer screening: Type of screening: Colonoscopy. Completed  . Repeat every 10 years  Mammogram status: Completed  . Repeat every year  Bone Density status: Completed  . Results reflect: Bone density results: OSTEOPOROSIS. Repeat every 2 years.  Lung Cancer Screening: (Low Dose CT Chest recommended if Age 61-80 years, 30 pack-year currently smoking OR have quit w/in 15years.) does not qualify.   Lung Cancer Screening Referral: na  Additional Screening:  Hepatitis C Screening: does qualify;   Vision Screening: Recommended annual ophthalmology exams for early detection of glaucoma and other disorders of the eye. Is the patient up to date with their annual eye exam?  Yes  Who is the provider or what is the name of the office in which the patient attends annual eye exams? Dr Laurance Flatten  If pt is not established with a provider, would they like to be referred to a provider to establish care? No .   Dental Screening: Recommended annual dental exams for proper oral hygiene  Community Resource Referral / Chronic Care Management: CRR required this visit?  No   CCM required this visit?  No  Plan:     I have personally reviewed and noted the following in the patient's chart:   Medical and social history Use of alcohol, tobacco or illicit drugs  Current medications and supplements including opioid prescriptions.  Functional ability and status Nutritional status Physical activity Advanced directives List of other physicians Hospitalizations, surgeries, and ER visits in previous 12 months Vitals Screenings to include cognitive, depression, and falls Referrals and appointments  In addition, I have reviewed and discussed with patient certain preventive protocols, quality metrics, and best practice recommendations. A written personalized care plan for preventive services as well as general preventive health  recommendations were provided to patient.     Leroy Kennedy, LPN   08/22/1038   Nurse Notes: na

## 2020-09-20 NOTE — Patient Instructions (Signed)
Ms. Kelsey Bruce , Thank you for taking time to come for your Medicare Wellness Visit. I appreciate your ongoing commitment to your health goals. Please review the following plan we discussed and let me know if I can assist you in the future.   Screening recommendations/referrals: Colonoscopy: up to date Mammogram: up to date Bone Density: up to date Recommended yearly ophthalmology/optometry visit for glaucoma screening and checkup Recommended yearly dental visit for hygiene and checkup  Vaccinations: Influenza vaccine: up to date Pneumococcal vaccine: up to date Tdap vaccine: up to date  Shingles vaccine: Education provided    Advanced directives: education provided  Conditions/risks identified: na   Next appointment: 01-30-2021 9:30  Dr. Anitra Lauth   Preventive Care 69 Years and Older, Female Preventive care refers to lifestyle choices and visits with your health care provider that can promote health and wellness. What does preventive care include? A yearly physical exam. This is also called an annual well check. Dental exams once or twice a year. Routine eye exams. Ask your health care provider how often you should have your eyes checked. Personal lifestyle choices, including: Daily care of your teeth and gums. Regular physical activity. Eating a healthy diet. Avoiding tobacco and drug use. Limiting alcohol use. Practicing safe sex. Taking low-dose aspirin every day. Taking vitamin and mineral supplements as recommended by your health care provider. What happens during an annual well check? The services and screenings done by your health care provider during your annual well check will depend on your age, overall health, lifestyle risk factors, and family history of disease. Counseling  Your health care provider may ask you questions about your: Alcohol use. Tobacco use. Drug use. Emotional well-being. Home and relationship well-being. Sexual activity. Eating  habits. History of falls. Memory and ability to understand (cognition). Work and work Statistician. Reproductive health. Screening  You may have the following tests or measurements: Height, weight, and BMI. Blood pressure. Lipid and cholesterol levels. These may be checked every 5 years, or more frequently if you are over 22 years old. Skin check. Lung cancer screening. You may have this screening every year starting at age 23 if you have a 30-pack-year history of smoking and currently smoke or have quit within the past 15 years. Fecal occult blood test (FOBT) of the stool. You may have this test every year starting at age 12. Flexible sigmoidoscopy or colonoscopy. You may have a sigmoidoscopy every 5 years or a colonoscopy every 10 years starting at age 60. Hepatitis C blood test. Hepatitis B blood test. Sexually transmitted disease (STD) testing. Diabetes screening. This is done by checking your blood sugar (glucose) after you have not eaten for a while (fasting). You may have this done every 1-3 years. Bone density scan. This is done to screen for osteoporosis. You may have this done starting at age 61. Mammogram. This may be done every 1-2 years. Talk to your health care provider about how often you should have regular mammograms. Talk with your health care provider about your test results, treatment options, and if necessary, the need for more tests. Vaccines  Your health care provider may recommend certain vaccines, such as: Influenza vaccine. This is recommended every year. Tetanus, diphtheria, and acellular pertussis (Tdap, Td) vaccine. You may need a Td booster every 10 years. Zoster vaccine. You may need this after age 28. Pneumococcal 13-valent conjugate (PCV13) vaccine. One dose is recommended after age 29. Pneumococcal polysaccharide (PPSV23) vaccine. One dose is recommended after age 28. Talk to your  health care provider about which screenings and vaccines you need and how  often you need them. This information is not intended to replace advice given to you by your health care provider. Make sure you discuss any questions you have with your health care provider. Document Released: 04/07/2015 Document Revised: 11/29/2015 Document Reviewed: 01/10/2015 Elsevier Interactive Patient Education  2017 [place] Prevention in the Home Falls can cause injuries. They can happen to people of all ages. There are many things you can do to make your home safe and to help prevent falls. What can I do on the outside of my home? Regularly fix the edges of walkways and driveways and fix any cracks. Remove anything that might make you trip as you walk through a door, such as a raised step or threshold. Trim any bushes or trees on the path to your home. Use bright outdoor lighting. Clear any walking paths of anything that might make someone trip, such as rocks or tools. Regularly check to see if handrails are loose or broken. Make sure that both sides of any steps have handrails. Any raised decks and porches should have guardrails on the edges. Have any leaves, snow, or ice cleared regularly. Use sand or salt on walking paths during winter. Clean up any spills in your garage right away. This includes oil or grease spills. What can I do in the bathroom? Use night lights. Install grab bars by the toilet and in the tub and shower. Do not use towel bars as grab bars. Use non-skid mats or decals in the tub or shower. If you need to sit down in the shower, use a plastic, non-slip stool. Keep the floor dry. Clean up any water that spills on the floor as soon as it happens. Remove soap buildup in the tub or shower regularly. Attach bath mats securely with double-sided non-slip rug tape. Do not have throw rugs and other things on the floor that can make you trip. What can I do in the bedroom? Use night lights. Make sure that you have a light by your bed that is easy to  reach. Do not use any sheets or blankets that are too big for your bed. They should not hang down onto the floor. Have a firm chair that has side arms. You can use this for support while you get dressed. Do not have throw rugs and other things on the floor that can make you trip. What can I do in the kitchen? Clean up any spills right away. Avoid walking on wet floors. Keep items that you use a lot in easy-to-reach places. If you need to reach something above you, use a strong step stool that has a grab bar. Keep electrical cords out of the way. Do not use floor polish or wax that makes floors slippery. If you must use wax, use non-skid floor wax. Do not have throw rugs and other things on the floor that can make you trip. What can I do with my stairs? Do not leave any items on the stairs. Make sure that there are handrails on both sides of the stairs and use them. Fix handrails that are broken or loose. Make sure that handrails are as long as the stairways. Check any carpeting to make sure that it is firmly attached to the stairs. Fix any carpet that is loose or worn. Avoid having throw rugs at the top or bottom of the stairs. If you do have throw rugs, attach them  to the floor with carpet tape. Make sure that you have a light switch at the top of the stairs and the bottom of the stairs. If you do not have them, ask someone to add them for you. What else can I do to help prevent falls? Wear shoes that: Do not have high heels. Have rubber bottoms. Are comfortable and fit you well. Are closed at the toe. Do not wear sandals. If you use a stepladder: Make sure that it is fully opened. Do not climb a closed stepladder. Make sure that both sides of the stepladder are locked into place. Ask someone to hold it for you, if possible. Clearly mark and make sure that you can see: Any grab bars or handrails. First and last steps. Where the edge of each step is. Use tools that help you move  around (mobility aids) if they are needed. These include: Canes. Walkers. Scooters. Crutches. Turn on the lights when you go into a dark area. Replace any light bulbs as soon as they burn out. Set up your furniture so you have a clear path. Avoid moving your furniture around. If any of your floors are uneven, fix them. If there are any pets around you, be aware of where they are. Review your medicines with your doctor. Some medicines can make you feel dizzy. This can increase your chance of falling. Ask your doctor what other things that you can do to help prevent falls. This information is not intended to replace advice given to you by your health care provider. Make sure you discuss any questions you have with your health care provider. Document Released: 01/05/2009 Document Revised: 08/17/2015 Document Reviewed: 04/15/2014 Elsevier Interactive Patient Education  2017 [employer].

## 2020-10-09 ENCOUNTER — Encounter: Payer: Self-pay | Admitting: Family Medicine

## 2020-10-09 ENCOUNTER — Ambulatory Visit (INDEPENDENT_AMBULATORY_CARE_PROVIDER_SITE_OTHER): Payer: Medicare Other | Admitting: Family Medicine

## 2020-10-09 ENCOUNTER — Other Ambulatory Visit: Payer: Self-pay

## 2020-10-09 VITALS — BP 134/73 | HR 77 | Temp 98.0°F | Wt 145.0 lb

## 2020-10-09 DIAGNOSIS — R519 Headache, unspecified: Secondary | ICD-10-CM | POA: Diagnosis not present

## 2020-10-09 DIAGNOSIS — R110 Nausea: Secondary | ICD-10-CM

## 2020-10-09 DIAGNOSIS — R8290 Unspecified abnormal findings in urine: Secondary | ICD-10-CM | POA: Diagnosis not present

## 2020-10-09 DIAGNOSIS — R42 Dizziness and giddiness: Secondary | ICD-10-CM

## 2020-10-09 LAB — POC URINALSYSI DIPSTICK (AUTOMATED)
Bilirubin, UA: NEGATIVE
Blood, UA: NEGATIVE
Glucose, UA: NEGATIVE
Ketones, UA: NEGATIVE
Nitrite, UA: NEGATIVE
Protein, UA: NEGATIVE
Spec Grav, UA: 1.01 (ref 1.010–1.025)
Urobilinogen, UA: 0.2 E.U./dL
pH, UA: 5 (ref 5.0–8.0)

## 2020-10-09 MED ORDER — FLUTICASONE PROPIONATE 50 MCG/ACT NA SUSP: 2.0000 | Freq: Every day | NASAL | 6 refills | Status: DC

## 2020-10-09 NOTE — Patient Instructions (Signed)

## 2020-10-09 NOTE — Progress Notes (Signed)
[id number]    This visit occurred during the SARS-CoV-2 public health emergency.  Safety protocols were in place, including screening questions prior to the visit, additional usage of staff PPE, and extensive cleaning of exam room while observing appropriate contact time as indicated for disinfecting solutions.    Kelsey Bruce , Kelsey Bruce, 70 y.o., Kelsey Bruce MRN: [id number] Patient Care Team    Relationship Specialty Notifications Start End  McGowen, Adrian Blackwater, MD PCP - General Family Medicine  07/06/12   Irene Shipper, MD Consulting Physician Gastroenterology  08/05/12   Emily Filbert, MD (Inactive) Consulting Physician Obstetrics and Gynecology  10/11/13   Everitt Amber, MD Consulting Physician Obstetrics and Gynecology  10/11/13   Latanya Maudlin, MD Consulting Physician Orthopedic Surgery  01/22/15   Gean Birchwood, DPM Consulting Physician Podiatry  05/07/16     Chief Complaint  Patient presents with   Headache    Pt c/o HA, dizziness and nausea x 3.5 week; pt reports she feels the rooming spinning intermittently      Subjective: Pt presents for an OV with complaints of mild right sided headache, room spinning dizziness and nausea of 3.5 weeks. She denies acute illness, chest pain, dyspnea or syncope. She reports dizziness present first. Sometimes worse in the morning when sitting up in bed. Does occur through day also. Dizziness last  a few seconds. Headache usually last about an hour or less. Nausea can come in waves when she is dizzy.  She reports she drinks coffee all morning and about noon she will have a glass of juice. She does not drink water.  She denies BPR or visual changes.   Depression screen [facility] 2/9 08/11/2020 07/15/2019 07/21/2017 05/08/2017 08/08/2016  Decreased Interest 0 0 0 0 0  Down, Depressed, Hopeless 0 1 0 0 0  PHQ - 2 Score 0 1 0 0 0    Allergies  Allergen Reactions   Aspirin Nausea Only    High dose only   Seasonal Ic [Cholestatin]    Social History    Social History Narrative   Divorced.  Two children.   Orig from [place], [place].   HS education.   Retired.   No tobacco, occ alcohol, no drugs.   Has 3 brothers and they all smoked, as did her parents.   Past Medical History:  Diagnosis Date   Allergy    Borderline hyperlipidemia    Framingham CV risk 2017= 6%.   Cataract    Cerebral palsy ([place])    Colon polyp, hyperplastic 2004; 12/2015   Recall 12/2025   Difficulty sleeping    Endometrial adenocarcinoma ([place]) 09/2013   Dr. Denman George: pt got vaginal brachytherapy via rad onc.  No sign of dz recurrence as of 02/2016 Gyn Onc f/u.  Next f/u with them is 6 mo.   Morton's neuroma 2012   Dr. Roseanne Reno did injections in the past (left foot)   Osteoarthritis    LB, HIPs, knees (L knee end stage), hands   Osteopenia    DEXA 10/2017 was -1.5.  10/2019 T score -1.9.  Rpt 2 yrs.   Patellofemoral arthralgia of left knee 12/24/2013   Radiation 12/16/13, 12/22/13, 12/30/13, 01/06/14, 01/13/14   proximal vagina 30 gray   Right hip pain 2021   more radicular, like DDD etiology->PT, [facility]   Rotator cuff tendonitis 04/21/2013   Seasonal allergic rhinitis    Past Surgical History:  Procedure Laterality Date   COLONOSCOPY  09/2002; 12/2015   2004 hyperplastic; recall  sent 2009 but pt apparently didn't respond.  12/2015: one sessile serrated polyp w/out cytologic atypia: recall 10 yrs per GI.   DEXA  08/2007; 11/14/15; 10/2017;10/2019   2009 T score -1.2.  2017 T score -1.6.  2019 T score -1.5. 10/2019 0-1.9.  Rpt 2 yrs   DILATION AND CURETTAGE OF UTERUS     LEG SURGERY Left 01/2014   "cleaned out cartilage"   ROBOTIC ASSISTED TOTAL HYSTERECTOMY WITH BILATERAL SALPINGO OOPHERECTOMY Bilateral 10/05/2013   Procedure: ROBOTIC ASSISTED TOTAL HYSTERECTOMY WITH BILATERAL SALPINGO OOPHORECTOMY WITH LYMPH NODE DISECTION;  Surgeon: Everitt Amber, MD;  Location: [facility];  Service: Gynecology;  Laterality: Bilateral;   TONSILLECTOMY     TOTAL KNEE ARTHROPLASTY Left  12/07/2014   Procedure: TOTAL KNEE ARTHROPLASTY;  Surgeon: Latanya Maudlin, MD;  Location: [facility];  Service: Orthopedics;  Laterality: Left;   TUBAL LIGATION     Family History  Problem Relation Age of Onset   Heart disease Father    Hypertension Father    Cancer Father        lung   Arthritis Father    Alcohol abuse Father    Heart disease Mother    Cancer Mother        lung   Deep vein thrombosis Mother    Alcohol abuse Mother    Heart disease Brother    Hypertension Brother    Heart disease Brother    Hypertension Brother    Heart disease Brother    Hypertension Brother    Stomach cancer Maternal Grandfather    Colon cancer Neg Hx    Esophageal cancer Neg Hx    Rectal cancer Neg Hx    Allergies as of 10/09/2020       Reactions   Aspirin Nausea Only   High dose only   Seasonal Ic [cholestatin]         Medication List        Accurate as of October 09, 2020  3:36 PM. If you have any questions, ask your nurse or doctor.          beta carotene w/minerals tablet Take 1 tablet by mouth every morning. Reported on 06/01/2015   CALCIUM PO Take 1 tablet by mouth daily.   cetirizine 10 MG tablet Commonly known as: ZYRTEC Take 10 mg by mouth daily as needed for allergies. Reported on 06/01/2015   CRANBERRY PO Take 4,200 mg by mouth. Take 2 capsules daily.   cyclobenzaprine 10 MG tablet Commonly known as: FLEXERIL 1 tab po bid prn for musculoskeletal pain   docusate sodium 100 MG capsule Commonly known as: COLACE Take 100 mg by mouth daily as needed for mild constipation. Reported on 06/01/2015   fluticasone 50 MCG/ACT nasal spray Commonly known as: FLONASE Place 2 sprays into both nostrils daily. Started by: Howard Pouch, DO   meloxicam 15 MG tablet Commonly known as: MOBIC Take 1 tablet (15 mg total) by mouth daily.   pantoprazole 40 MG tablet Commonly known as: PROTONIX TAKE ONE TABLET BY MOUTH DAILY   polyethylene glycol 17 g packet Commonly known as:  MIRALAX / GLYCOLAX Take 17 g by mouth daily as needed for mild constipation. Reported on 06/01/2015   PROBIOTIC PO Take 1 tablet by mouth every morning. Reported on 06/01/2015   vitamin B-12 1000 MCG tablet Commonly known as: CYANOCOBALAMIN Take 1,000 mcg by mouth daily.   VITAMIN D PO Take 1 tablet by mouth daily.        All  past medical history, surgical history, allergies, family history, immunizations andmedications were updated in the EMR today and reviewed under the history and medication portions of their EMR.     ROS: Negative, with the exception of above mentioned in HPI   Objective:  BP 134/73   Pulse 77   Temp 98 F (36.7 C) (Oral)   Wt 145 lb (65.8 kg)   SpO2 96%   BMI 28.32 kg/m  Body mass index is 28.32 kg/m. Gen: Afebrile. No acute distress. Nontoxic in appearance, well developed, well nourished. Pleasant Kelsey Bruce.  HENT: AT. [place]. Bilateral TM visualized mild effusion bilaterally, no erythema. EAC normal. MMM, no oral lesions. Bilateral nares with moderate erythema, no drainage. Throat without erythema or exudates. No cough or hoarseness.  Eyes:Pupils Equal Round Reactive to light, Extraocular movements intact,  Conjunctiva without redness, discharge or icterus. Neck/lymp/endocrine: Supple,no lymphadenopathy CV: RRR no murmur, no edema Chest: CTAB, no wheeze or crackles. Good air movement, normal resp effort.  Skin: no rashes, purpura or petechiae.  Neuro: Normal gait. PERLA. EOMi. Alert. Oriented x3  Psych: Normal affect, dress and demeanor. Normal speech. Normal thought content and judgment.  No results found. No results found. Results for orders placed or performed in visit on 10/09/20 (from the past 24 hour(s))  POCT Urinalysis Dipstick (Automated)     Status: Abnormal   Collection Time: 10/09/20  3:31 PM  Result Value Ref Range   Color, UA pale yellow    Clarity, UA clear    Glucose, UA Negative Negative   Bilirubin, UA negative    Ketones, UA  negative    Spec Grav, UA 1.010 1.010 - 1.025   Blood, UA negative    pH, UA 5.0 5.0 - 8.0   Protein, UA Negative Negative   Urobilinogen, UA 0.2 0.2 or 1.0 E.U./dL   Nitrite, UA neg    Leukocytes, UA Moderate (2+) (A) Negative    Assessment/Plan: Kelsey Bruce is a 70 y.o. Kelsey Bruce present for OV for  Dizziness/Nonintractable episodic headache, unspecified headache type/Nausea Suspect mild dehydration as cause of her symptoms, she does not drink any water daily.  Will rule out anemia, UTI and electrolyte disturbance.  - Basic Metabolic Panel (BMET) and cbc - POCT Urinalysis Dipstick (Automated)> +2 leuks> sent for culture Pt was encourage to attempt 64 ounces of water/G2 etc daily.  F/u dependent on lab results.     Reviewed expectations re: course of current medical issues. Discussed self-management of symptoms. Outlined signs and symptoms indicating need for more acute intervention. Patient verbalized understanding and all questions were answered. Patient received an After-Visit Summary.    Orders Placed This Encounter  Procedures   Basic Metabolic Panel (BMET)   CBC w/Diff   Urinalysis w microscopic + reflex cultur   POCT Urinalysis Dipstick (Automated)    Meds ordered this encounter  Medications   fluticasone (FLONASE) 50 MCG/ACT nasal spray    Sig: Place 2 sprays into both nostrils daily.    Dispense:  16 g    Refill:  6    Referral Orders  No referral(s) requested today     Note is dictated utilizing voice recognition software. Although note has been proof read prior to signing, occasional typographical errors still can be missed. If any questions arise, please do not hesitate to call for verification.   electronically signed by:  Howard Pouch, DO  [place]

## 2020-10-10 LAB — CBC WITH DIFFERENTIAL/PLATELET
Absolute Monocytes: 440 cells/uL (ref 200–950)
Basophils Absolute: 43 cells/uL (ref 0–200)
Basophils Relative: 0.6 %
Eosinophils Absolute: 99 cells/uL (ref 15–500)
Eosinophils Relative: 1.4 %
HCT: 43 % (ref 35.0–45.0)
Hemoglobin: 14.4 g/dL (ref 11.7–15.5)
Lymphs Abs: 2251 cells/uL (ref 850–3900)
MCH: 30.3 pg (ref 27.0–33.0)
MCHC: 33.5 g/dL (ref 32.0–36.0)
MCV: 90.3 fL (ref 80.0–100.0)
MPV: 10.2 fL (ref 7.5–12.5)
Monocytes Relative: 6.2 %
Neutro Abs: 4267 cells/uL (ref 1500–7800)
Neutrophils Relative %: 60.1 %
Platelets: 305 10*3/uL (ref 140–400)
RBC: 4.76 10*6/uL (ref 3.80–5.10)
RDW: 12.4 % (ref 11.0–15.0)
Total Lymphocyte: 31.7 %
WBC: 7.1 10*3/uL (ref 3.8–10.8)

## 2020-10-10 LAB — BASIC METABOLIC PANEL
BUN: 17 mg/dL (ref 7–25)
CO2: 22 mmol/L (ref 20–32)
Calcium: 9.6 mg/dL (ref 8.6–10.4)
Chloride: 107 mmol/L (ref 98–110)
Creat: 0.86 mg/dL (ref 0.50–1.05)
Glucose, Bld: 150 mg/dL — ABNORMAL HIGH (ref 65–99)
Potassium: 3.8 mmol/L (ref 3.5–5.3)
Sodium: 143 mmol/L (ref 135–146)

## 2020-10-11 ENCOUNTER — Telehealth: Payer: Self-pay

## 2020-10-11 LAB — URINALYSIS W MICROSCOPIC + REFLEX CULTURE
Bacteria, UA: NONE SEEN /HPF
Bilirubin Urine: NEGATIVE
Glucose, UA: NEGATIVE
Hgb urine dipstick: NEGATIVE
Hyaline Cast: NONE SEEN /LPF
Ketones, ur: NEGATIVE
Nitrites, Initial: NEGATIVE
Protein, ur: NEGATIVE
RBC / HPF: NONE SEEN /HPF (ref 0–2)
Specific Gravity, Urine: 1.005 (ref 1.001–1.035)
Squamous Epithelial / HPF: NONE SEEN /HPF (ref <=5)
WBC, UA: NONE SEEN /HPF (ref 0–5)
pH: 5.5 (ref 5.0–8.0)

## 2020-10-11 LAB — CULTURE INDICATED

## 2020-10-11 LAB — URINE CULTURE
MICRO NUMBER:: 12135453
Result:: NO GROWTH
SPECIMEN QUALITY:: ADEQUATE

## 2020-10-11 NOTE — Telephone Encounter (Signed)
Pt seen in office  [facility] - Client TELEPHONE ADVICE RECORD [facility] Patient Name: Kelsey Bruce [facility] Gender: Female DOB: 1950/08/06 Age: 70 Y 85 M 9 D Return Phone Number: [phone] (Primary), [phone] (Secondary) Address: City/ State/ Zip: [place]  [address] Client [facility] - Client Client Site [place] - Day Physician Crissie Sickles - MD Contact Type Call Who Is Calling Patient / Member / Family / Caregiver Call Type Triage / Clinical Relationship To Patient Self Return Phone Number [phone] (Primary) Chief Complaint Headache Reason for Call Symptomatic / Request for [place] states she has been having headaches. Dizziness. Translation No Nurse Assessment Nurse: Zorita Pang, RN, Deborah Date/Time (Eastern Time): 10/09/2020 11:37:31 AM Confirm and document reason for call. If symptomatic, describe symptoms. ---The caller has a headache in the morning. She is only dizzy when she bends forward. Does the patient have any new or worsening symptoms? ---Yes Will a triage be completed? ---Yes Related visit to physician within the last 2 weeks? ---No Does the PT have any chronic conditions? (i.e. diabetes, asthma, this includes High risk factors for pregnancy, etc.) ---Yes List chronic conditions. ---arthritis taking meloxicam Is this a behavioral health or substance abuse call? ---No Guidelines Guideline Title Affirmed Question Affirmed Notes Nurse Date/Time (Eastern Time) Headache [1] New headache AND [2] age > 71 Womble, RN, Neoma Laming 10/09/2020 11:39:09 AM Disp. Time Eilene Ghazi Time) Disposition Final User 10/09/2020 11:42:49 AM See PCP within 24 Hours Yes Womble, RN, Garrel Ridgel Disagree/Comply Comply Caller Understands Yes PLEASE NOTE: All timestamps contained within this report are represented as [place] Standard Time. CONFIDENTIALTY NOTICE:  This fax transmission is intended only for the addressee. It contains information that is legally privileged, confidential or otherwise protected from use or disclosure. If you are not the intended recipient, you are strictly prohibited from reviewing, disclosing, copying using or disseminating any of this information or taking any action in reliance on or regarding this information. If you have received this fax in error, please notify us immediately by telephone so that we can arrange for its return to [place]. Phone: [phone], Toll-Free: [phone], Fax: [phone] Page: 2 of 2 Call Id: [id number] PreDisposition Call Doctor Care Advice Given Per Guideline SEE PCP WITHIN 24 HOURS: CALL BACK IF: * You become worse Comments User: Marquis Buggy, RN Date/Time Eilene Ghazi Time): 10/09/2020 11:45:12 AM Caller was instructed to call the office back and tell them that the triage nurse said that you need to be seen in the next 24 hours and she verbalized understanding. She stated that when she called the office they told her there were no appointments until Wednesday and this nurse told her to ask if another provider could see her and it was up to her whether she wanted to wait. But that she needs to call back if she develops any new symptoms or if she worsened and she verbalized understand

## 2020-10-12 DIAGNOSIS — M9903 Segmental and somatic dysfunction of lumbar region: Secondary | ICD-10-CM | POA: Diagnosis not present

## 2020-10-12 DIAGNOSIS — M5432 Sciatica, left side: Secondary | ICD-10-CM | POA: Diagnosis not present

## 2020-10-20 ENCOUNTER — Other Ambulatory Visit (HOSPITAL_BASED_OUTPATIENT_CLINIC_OR_DEPARTMENT_OTHER): Payer: Self-pay | Admitting: Family Medicine

## 2020-10-20 DIAGNOSIS — Z1231 Encounter for screening mammogram for malignant neoplasm of breast: Secondary | ICD-10-CM

## 2020-10-26 DIAGNOSIS — M5432 Sciatica, left side: Secondary | ICD-10-CM | POA: Diagnosis not present

## 2020-10-26 DIAGNOSIS — M9903 Segmental and somatic dysfunction of lumbar region: Secondary | ICD-10-CM | POA: Diagnosis not present

## 2020-11-16 DIAGNOSIS — M5432 Sciatica, left side: Secondary | ICD-10-CM | POA: Diagnosis not present

## 2020-11-16 DIAGNOSIS — M9903 Segmental and somatic dysfunction of lumbar region: Secondary | ICD-10-CM | POA: Diagnosis not present

## 2020-11-24 ENCOUNTER — Ambulatory Visit (INDEPENDENT_AMBULATORY_CARE_PROVIDER_SITE_OTHER): Payer: Medicare Other | Admitting: Family Medicine

## 2020-11-24 ENCOUNTER — Encounter: Payer: Self-pay | Admitting: Family Medicine

## 2020-11-24 ENCOUNTER — Other Ambulatory Visit: Payer: Self-pay

## 2020-11-24 VITALS — BP 134/79 | HR 72 | Temp 98.1°F | Resp 16 | Ht 60.0 in | Wt 146.6 lb

## 2020-11-24 DIAGNOSIS — R3915 Urgency of urination: Secondary | ICD-10-CM

## 2020-11-24 DIAGNOSIS — N3001 Acute cystitis with hematuria: Secondary | ICD-10-CM

## 2020-11-24 LAB — POCT URINALYSIS DIPSTICK
Bilirubin, UA: NEGATIVE
Blood, UA: POSITIVE
Glucose, UA: NEGATIVE
Ketones, UA: NEGATIVE
Nitrite, UA: NEGATIVE
Protein, UA: NEGATIVE
Spec Grav, UA: <=1.005 — AB (ref 1.010–1.025)
Urobilinogen, UA: 0.2 E.U./dL
pH, UA: 6.5 (ref 5.0–8.0)

## 2020-11-24 MED ORDER — SULFAMETHOXAZOLE-TRIMETHOPRIM 800-160 MG PO TABS: ORAL_TABLET | ORAL | 0 refills | Status: DC

## 2020-11-24 NOTE — Progress Notes (Signed)
OFFICE VISIT  11/24/2020  CC:  Chief Complaint  Patient presents with   Urinary urgency    C/o pressure as well; starting 2 days ago.    HPI:    Patient is a 70 y.o. Caucasian female who presents for urinary urgency. Onset urgency/frequency/discomfort in bladder/urethra area 2 days ago. No burning.  No n/v or abd pain.  No fevers.  NO flank pain.   Past Medical History:  Diagnosis Date   Allergy    Borderline hyperlipidemia    Framingham CV risk 2017= 6%.   Cataract    Cerebral palsy ([place])    Colon polyp, hyperplastic 2004; 12/2015   Recall 12/2025   Difficulty sleeping    Endometrial adenocarcinoma ([place]) 09/2013   Dr. Denman George: pt got vaginal brachytherapy via rad onc.  No sign of dz recurrence as of 02/2016 Gyn Onc f/u.  Next f/u with them is 6 mo.   Morton's neuroma 2012   Dr. Roseanne Reno did injections in the past (left foot)   Osteoarthritis    LB, HIPs, knees (L knee end stage), hands   Osteopenia    DEXA 10/2017 was -1.5.  10/2019 T score -1.9.  Rpt 2 yrs.   Patellofemoral arthralgia of left knee 12/24/2013   Radiation 12/16/13, 12/22/13, 12/30/13, 01/06/14, 01/13/14   proximal vagina 30 gray   Right hip pain 2021   more radicular, like DDD etiology->[facility], [facility]   Rotator cuff tendonitis 04/21/2013   Seasonal allergic rhinitis     Past Surgical History:  Procedure Laterality Date   COLONOSCOPY  09/2002; 12/2015   2004 hyperplastic; recall sent 2009 but pt apparently didn't respond.  12/2015: one sessile serrated polyp w/out cytologic atypia: recall 10 yrs per GI.   DEXA  08/2007; 11/14/15; 10/2017;10/2019   2009 T score -1.2.  2017 T score -1.6.  2019 T score -1.5. 10/2019 0-1.9.  Rpt 2 yrs   DILATION AND CURETTAGE OF UTERUS     LEG SURGERY Left 01/2014   "cleaned out cartilage"   ROBOTIC ASSISTED TOTAL HYSTERECTOMY WITH BILATERAL SALPINGO OOPHERECTOMY Bilateral 10/05/2013   Procedure: ROBOTIC ASSISTED TOTAL HYSTERECTOMY WITH BILATERAL SALPINGO OOPHORECTOMY WITH LYMPH NODE  DISECTION;  Surgeon: Everitt Amber, MD;  Location: [facility];  Service: Gynecology;  Laterality: Bilateral;   TONSILLECTOMY     TOTAL KNEE ARTHROPLASTY Left 12/07/2014   Procedure: TOTAL KNEE ARTHROPLASTY;  Surgeon: Latanya Maudlin, MD;  Location: [facility];  Service: Orthopedics;  Laterality: Left;   TUBAL LIGATION      Outpatient Medications Prior to Visit  Medication Sig Dispense Refill   beta carotene w/minerals (OCUVITE) tablet Take 1 tablet by mouth every morning. Reported on 06/01/2015     CALCIUM PO Take 1 tablet by mouth daily.     cetirizine (ZYRTEC) 10 MG tablet Take 10 mg by mouth daily as needed for allergies. Reported on 06/01/2015     Cholecalciferol (VITAMIN D PO) Take 1 tablet by mouth daily.     CRANBERRY PO Take 4,200 mg by mouth. Take 2 capsules daily.     cyclobenzaprine (FLEXERIL) 10 MG tablet 1 tab po bid prn for musculoskeletal pain 30 tablet 5   docusate sodium (COLACE) 100 MG capsule Take 100 mg by mouth daily as needed for mild constipation. Reported on 06/01/2015     fluticasone (FLONASE) 50 MCG/ACT nasal spray Place 2 sprays into both nostrils daily. 16 g 6   meloxicam (MOBIC) 15 MG tablet Take 1 tablet (15 mg total) by mouth daily. [place]  tablet 1   pantoprazole (PROTONIX) 40 MG tablet TAKE ONE TABLET BY MOUTH DAILY 90 tablet 3   polyethylene glycol (MIRALAX / GLYCOLAX) packet Take 17 g by mouth daily as needed for mild constipation. Reported on 06/01/2015     Probiotic Product (PROBIOTIC PO) Take 1 tablet by mouth every morning. Reported on 06/01/2015     vitamin B-12 (CYANOCOBALAMIN) 1000 MCG tablet Take 1,000 mcg by mouth daily.     No facility-administered medications prior to visit.    Allergies  Allergen Reactions   Aspirin Nausea Only    High dose only   Seasonal Ic [Cholestatin]     ROS As per HPI  PE: Vitals with BMI 11/24/2020 10/09/2020 09/20/2020  Height '5\' 0"'$  - (No Data)  Weight 146 lbs 10 oz 145 lbs (No Data)  BMI [id number] - -  Systolic Q000111Q Q000111Q (No Data)   Diastolic 79 73 (No Data)  Pulse 72 77 -   Gen: Alert, well appearing.  Patient is oriented to person, place, time, and situation. AFFECT: pleasant, lucid thought and speech. No further exam today.  LABS:    Chemistry      Component Value Date/Time   NA 143 10/09/2020 1511   K 3.8 10/09/2020 1511   CL 107 10/09/2020 1511   CO2 22 10/09/2020 1511   BUN 17 10/09/2020 1511   CREATININE 0.86 10/09/2020 1511      Component Value Date/Time   CALCIUM 9.6 10/09/2020 1511   ALKPHOS 60 07/15/2019 0930   AST 22 08/11/2020 0908   ALT 17 08/11/2020 0908   BILITOT 0.5 08/11/2020 0908     POC CC UA today: 2+ leuks, +/- blood, otherwise normal.  IMPRESSION AND PLAN:  Acute cystitis with microhematuria. Bactrim DS 1 bid x 3d. Urine sent for c/s.  An After Visit Summary was printed and given to the patient.  FOLLOW UP: Return if symptoms worsen or fail to improve.  Signed:  Crissie Sickles, MD           11/24/2020

## 2020-11-26 LAB — URINE CULTURE
MICRO NUMBER:: 12329529
Result:: NO GROWTH
SPECIMEN QUALITY:: ADEQUATE

## 2020-11-28 ENCOUNTER — Ambulatory Visit (HOSPITAL_BASED_OUTPATIENT_CLINIC_OR_DEPARTMENT_OTHER)
Admission: RE | Admit: 2020-11-28 | Discharge: 2020-11-28 | Disposition: A | Discharge status: Home / Self Care | Payer: Medicare Other | Source: Ambulatory Visit | Attending: Family Medicine | Admitting: Family Medicine

## 2020-11-28 ENCOUNTER — Other Ambulatory Visit: Payer: Self-pay

## 2020-11-28 ENCOUNTER — Encounter (HOSPITAL_BASED_OUTPATIENT_CLINIC_OR_DEPARTMENT_OTHER): Payer: Self-pay

## 2020-11-28 DIAGNOSIS — Z1231 Encounter for screening mammogram for malignant neoplasm of breast: Secondary | ICD-10-CM | POA: Diagnosis not present

## 2020-11-29 ENCOUNTER — Telehealth: Payer: Self-pay

## 2020-11-29 NOTE — Telephone Encounter (Signed)
Her urine last week did not grow any bacteria. Pls see if she can come for o/v tomorrow afternoon.

## 2020-11-29 NOTE — Telephone Encounter (Signed)
Pt states that she is still having symptoms from UTI. Medication has not helped at this time. Please advise.

## 2020-11-30 ENCOUNTER — Ambulatory Visit: Payer: Medicare Other | Admitting: Family Medicine

## 2020-11-30 NOTE — Telephone Encounter (Signed)
Spoke with pt regarding this, she is available to come in at 4pm today. Appt will be scheduled

## 2020-12-01 ENCOUNTER — Ambulatory Visit (INDEPENDENT_AMBULATORY_CARE_PROVIDER_SITE_OTHER): Payer: Medicare Other | Admitting: Family Medicine

## 2020-12-01 ENCOUNTER — Other Ambulatory Visit: Payer: Self-pay

## 2020-12-01 ENCOUNTER — Encounter: Payer: Self-pay | Admitting: Family Medicine

## 2020-12-01 VITALS — BP 120/80 | HR 73 | Temp 97.8°F | Resp 18 | Ht 60.0 in | Wt 145.4 lb

## 2020-12-01 DIAGNOSIS — R8290 Unspecified abnormal findings in urine: Secondary | ICD-10-CM | POA: Diagnosis not present

## 2020-12-01 DIAGNOSIS — R3915 Urgency of urination: Secondary | ICD-10-CM | POA: Diagnosis not present

## 2020-12-01 LAB — POCT URINALYSIS DIPSTICK
Bilirubin, UA: NEGATIVE
Blood, UA: NEGATIVE
Glucose, UA: NEGATIVE
Ketones, UA: NEGATIVE
Nitrite, UA: NEGATIVE
Protein, UA: NEGATIVE
Spec Grav, UA: 1.01 (ref 1.010–1.025)
Urobilinogen, UA: 0.2 E.U./dL
pH, UA: 7 (ref 5.0–8.0)

## 2020-12-01 MED ORDER — CIPROFLOXACIN HCL 250 MG PO TABS: 250.0000 mg | ORAL_TABLET | Freq: Two times a day (BID) | ORAL | 0 refills | Status: AC

## 2020-12-01 NOTE — Progress Notes (Signed)
Subjective:   By signing my name below, I, Zite Okoli, attest that this documentation has been prepared under the direction and in the presence of Ann Held, DO. 12/01/2020    Patient ID: Kelsey Bruce, female    DOB: [birth year], 70 y.o.   MRN: [id number]  Chief Complaint  Patient presents with   Urinary Urgency    PCP is Dr Anitra Lauth- seen on 11/24/20 for similar sxs and was rx Bactrim DS. Pt says her sxs have remained the same.    HPI Patient is in today for a office visit.  She went to see her PCP on 09/02 for a UTI and was prescribed a 5-day antibiotic which provided moderate relief. However, she mentions she still feels some pressure when she urinates, urgency and frequency. She denies abdominal pain, fever and dysuria. She mentions she has been getting UTIs more frequently.  She does not want a flu vaccine at this time.  Past Medical History:  Diagnosis Date   Allergy    Borderline hyperlipidemia    Framingham CV risk 2017= 6%.   Cataract    Cerebral palsy ([place])    Colon polyp, hyperplastic 2004; 12/2015   Recall 12/2025   Difficulty sleeping    Endometrial adenocarcinoma ([place]) 09/2013   Dr. Denman George: pt got vaginal brachytherapy via rad onc.  No sign of dz recurrence as of 02/2016 Gyn Onc f/u.  Next f/u with them is 6 mo.   Morton's neuroma 2012   Dr. Roseanne Reno did injections in the past (left foot)   Osteoarthritis    LB, HIPs, knees (L knee end stage), hands   Osteopenia    DEXA 10/2017 was -1.5.  10/2019 T score -1.9.  Rpt 2 yrs.   Patellofemoral arthralgia of left knee 12/24/2013   Radiation 12/16/13, 12/22/13, 12/30/13, 01/06/14, 01/13/14   proximal vagina 30 gray   Right hip pain 2021   more radicular, like DDD etiology->[facility]   Rotator cuff tendonitis 04/21/2013   Seasonal allergic rhinitis     Past Surgical History:  Procedure Laterality Date   COLONOSCOPY  09/2002; 12/2015   2004 hyperplastic; recall sent 2009 but pt apparently didn't  respond.  12/2015: one sessile serrated polyp w/out cytologic atypia: recall 10 yrs per GI.   DEXA  08/2007; 11/14/15; 10/2017;10/2019   2009 T score -1.2.  2017 T score -1.6.  2019 T score -1.5. 10/2019 0-1.9.  Rpt 2 yrs   DILATION AND CURETTAGE OF UTERUS     LEG SURGERY Left 01/2014   "cleaned out cartilage"   ROBOTIC ASSISTED TOTAL HYSTERECTOMY WITH BILATERAL SALPINGO OOPHERECTOMY Bilateral 10/05/2013   Procedure: ROBOTIC ASSISTED TOTAL HYSTERECTOMY WITH BILATERAL SALPINGO OOPHORECTOMY WITH LYMPH NODE DISECTION;  Surgeon: Everitt Amber, MD;  Location: [facility];  Service: Gynecology;  Laterality: Bilateral;   TONSILLECTOMY     TOTAL KNEE ARTHROPLASTY Left 12/07/2014   Procedure: TOTAL KNEE ARTHROPLASTY;  Surgeon: Latanya Maudlin, MD;  Location: [facility];  Service: Orthopedics;  Laterality: Left;   TUBAL LIGATION      Family History  Problem Relation Age of Onset   Heart disease Father    Hypertension Father    Cancer Father        lung   Arthritis Father    Alcohol abuse Father    Heart disease Mother    Cancer Mother        lung   Deep vein thrombosis Mother    Alcohol abuse Mother  Heart disease Brother    Hypertension Brother    Heart disease Brother    Hypertension Brother    Heart disease Brother    Hypertension Brother    Stomach cancer Maternal Grandfather    Colon cancer Neg Hx    Esophageal cancer Neg Hx    Rectal cancer Neg Hx     Social History   Socioeconomic History   Marital status: Divorced    Spouse name: Not on file   Number of children: 2   Years of education: Not on file   Highest education level: Not on file  Occupational History   Occupation: retired  Tobacco Use   Smoking status: Never   Smokeless tobacco: Never  Vaping Use   Vaping Use: Never used  Substance and Sexual Activity   Alcohol use: No    Comment: occasional   Drug use: No   Sexual activity: Not on file  Other Topics Concern   Not on file  Social History Narrative   Divorced.  Two  children.   Orig from [place], [place].   HS education.   Retired.   No tobacco, occ alcohol, no drugs.   Has 3 brothers and they all smoked, as did her parents.   Social Determinants of Health   Financial Resource Strain: Low Risk    Difficulty of Paying Living Expenses: Not very hard  Food Insecurity: No Food Insecurity   Worried About Charity fundraiser in the Last Year: Never true   Ran Out of Food in the Last Year: Never true  Transportation Needs: No Transportation Needs   Lack of Transportation (Medical): No   Lack of Transportation (Non-Medical): No  Physical Activity: Inactive   Days of Exercise per Week: 0 days   Minutes of Exercise per Session: 0 min  Stress: No Stress Concern Present   Feeling of Stress : Not at all  Social Connections: Moderately Integrated   Frequency of Communication with Friends and Family: More than three times a week   Frequency of Social Gatherings with Friends and Family: More than three times a week   Attends Religious Services: More than 4 times per year   Active Member of [employer] or Organizations: Yes   Attends Archivist Meetings: Never   Marital Status: Divorced  Human resources officer Violence: Not At Risk   Fear of Current or Ex-Partner: No   Emotionally Abused: No   Physically Abused: No   Sexually Abused: No    Outpatient Medications Prior to Visit  Medication Sig Dispense Refill   beta carotene w/minerals (OCUVITE) tablet Take 1 tablet by mouth every morning. Reported on 06/01/2015     CALCIUM PO Take 1 tablet by mouth daily.     cetirizine (ZYRTEC) 10 MG tablet Take 10 mg by mouth daily as needed for allergies. Reported on 06/01/2015     Cholecalciferol (VITAMIN D PO) Take 1 tablet by mouth daily.     CRANBERRY PO Take 4,200 mg by mouth. Take 2 capsules daily.     cyclobenzaprine (FLEXERIL) 10 MG tablet 1 tab po bid prn for musculoskeletal pain 30 tablet 5   docusate sodium (COLACE) 100 MG capsule Take 100 mg by mouth daily as  needed for mild constipation. Reported on 06/01/2015     fluticasone (FLONASE) 50 MCG/ACT nasal spray Place 2 sprays into both nostrils daily. 16 g 6   meloxicam (MOBIC) 15 MG tablet Take 1 tablet (15 mg total) by mouth daily. 90 tablet 1  pantoprazole (PROTONIX) 40 MG tablet TAKE ONE TABLET BY MOUTH DAILY 90 tablet 3   polyethylene glycol (MIRALAX / GLYCOLAX) packet Take 17 g by mouth daily as needed for mild constipation. Reported on 06/01/2015     Probiotic Product (PROBIOTIC PO) Take 1 tablet by mouth every morning. Reported on 06/01/2015     vitamin B-12 (CYANOCOBALAMIN) 1000 MCG tablet Take 1,000 mcg by mouth daily.     sulfamethoxazole-trimethoprim (BACTRIM DS) 800-160 MG tablet 1 tab po bid x 5d (Patient not taking: Reported on 12/01/2020) 10 tablet 0   No facility-administered medications prior to visit.    Allergies  Allergen Reactions   Aspirin Nausea Only    High dose only   Seasonal Ic [Cholestatin]     Review of Systems  Constitutional:  Negative for fever and malaise/fatigue.  HENT:  Negative for congestion.   Eyes:  Negative for blurred vision.  Respiratory:  Negative for cough and shortness of breath.   Cardiovascular:  Negative for chest pain, palpitations and leg swelling.  Gastrointestinal:  Negative for abdominal pain and vomiting.  Genitourinary:  Positive for frequency and urgency. Negative for dysuria.       (+) pressure while urinating  Musculoskeletal:  Negative for back pain.  Skin:  Negative for rash.  Neurological:  Negative for loss of consciousness and headaches.      Objective:    Physical Exam Vitals and nursing note reviewed.  Constitutional:      General: She is not in acute distress.    Appearance: Normal appearance. She is not ill-appearing.  HENT:     Head: Normocephalic and atraumatic.     Right Ear: External ear normal.     Left Ear: External ear normal.  Eyes:     Extraocular Movements: Extraocular movements intact.     Pupils: Pupils  are equal, round, and reactive to light.  Cardiovascular:     Rate and Rhythm: Normal rate and regular rhythm.     Pulses: Normal pulses.     Heart sounds: Normal heart sounds. No murmur heard.   No gallop.  Pulmonary:     Effort: Pulmonary effort is normal. No respiratory distress.     Breath sounds: Normal breath sounds. No wheezing, rhonchi or rales.  Abdominal:     General: Bowel sounds are normal. There is no distension.     Palpations: Abdomen is soft. There is no mass.     Tenderness: There is no abdominal tenderness. There is no guarding or rebound.     Hernia: No hernia is present.  Musculoskeletal:     Cervical back: Normal range of motion and neck supple.  Lymphadenopathy:     Cervical: No cervical adenopathy.  Skin:    General: Skin is warm and dry.  Neurological:     Mental Status: She is alert and oriented to person, place, and time.  Psychiatric:        Behavior: Behavior normal.    BP 120/80 (BP Location: Left Arm, Patient Position: Sitting, Cuff Size: Normal)   Pulse 73   Temp 97.8 F (36.6 C) (Oral)   Resp 18   Ht 5' (1.524 m)   Wt 145 lb 6.4 oz (66 kg)   SpO2 98%   BMI 28.40 kg/m  Wt Readings from Last 3 Encounters:  12/01/20 145 lb 6.4 oz (66 kg)  11/24/20 146 lb 9.6 oz (66.5 kg)  10/09/20 145 lb (65.8 kg)    Diabetic Foot Exam - Simple  No data filed    Lab Results  Component Value Date   WBC 7.1 10/09/2020   HGB 14.4 10/09/2020   HCT 43.0 10/09/2020   PLT 305 10/09/2020   GLUCOSE 150 (H) 10/09/2020   CHOL 164 08/11/2020   TRIG 77 08/11/2020   HDL 47 (L) 08/11/2020   LDLCALC 100 (H) 08/11/2020   ALT 17 08/11/2020   AST 22 08/11/2020   NA 143 10/09/2020   K 3.8 10/09/2020   CL 107 10/09/2020   CREATININE 0.86 10/09/2020   BUN 17 10/09/2020   CO2 22 10/09/2020   TSH 3.40 07/15/2019   INR 1.0 09/27/2015   HGBA1C 5.8 08/09/2019    Lab Results  Component Value Date   TSH 3.40 07/15/2019   Lab Results  Component Value Date    WBC 7.1 10/09/2020   HGB 14.4 10/09/2020   HCT 43.0 10/09/2020   MCV 90.3 10/09/2020   PLT 305 10/09/2020   Lab Results  Component Value Date   NA 143 10/09/2020   K 3.8 10/09/2020   CO2 22 10/09/2020   GLUCOSE 150 (H) 10/09/2020   BUN 17 10/09/2020   CREATININE 0.86 10/09/2020   BILITOT 0.5 08/11/2020   ALKPHOS 60 07/15/2019   AST 22 08/11/2020   ALT 17 08/11/2020   PROT 6.6 08/11/2020   ALBUMIN 4.1 07/15/2019   CALCIUM 9.6 10/09/2020   ANIONGAP 7 12/09/2014   GFR 83.15 01/20/2020   Lab Results  Component Value Date   CHOL 164 08/11/2020   Lab Results  Component Value Date   HDL 47 (L) 08/11/2020   Lab Results  Component Value Date   LDLCALC 100 (H) 08/11/2020   Lab Results  Component Value Date   TRIG 77 08/11/2020   Lab Results  Component Value Date   CHOLHDL 3.5 08/11/2020   Lab Results  Component Value Date   HGBA1C 5.8 08/09/2019       Assessment & Plan:   Problem List Items Addressed This Visit   None Visit Diagnoses     Urinary urgency    -  Primary   Relevant Medications   ciprofloxacin (CIPRO) 250 MG tablet   Other Relevant Orders   POCT Urinalysis Dipstick (Completed)   Urine Culture   Abnormal urine       Relevant Medications   ciprofloxacin (CIPRO) 250 MG tablet   Other Relevant Orders   Urine Culture        Meds ordered this encounter  Medications   ciprofloxacin (CIPRO) 250 MG tablet    Sig: Take 1 tablet (250 mg total) by mouth 2 (two) times daily for 3 days.    Dispense:  6 tablet    Refill:  0    I,Zite Okoli,acting as a scribe for [employer], DO.,have documented all relevant documentation on the behalf of Ann Held, DO,as directed by  Ann Held, DO while in the presence of Ann Held, DO.   I, Ann Held, DO. , personally preformed the services described in this documentation.  All medical record entries made by the scribe were at my direction and in my presence.  I  have reviewed the chart and discharge instructions (if applicable) and agree that the record reflects my personal performance and is accurate and complete. 12/01/2020

## 2020-12-01 NOTE — Patient Instructions (Signed)
Urinary Tract Infection, Adult A urinary tract infection (UTI) is an infection of any part of the urinary tract. The urinary tract includes the kidneys, ureters, bladder, and urethra. These organs make, store, and get rid of urine in the body. An upper UTI affects the ureters and kidneys. A lower UTI affects the bladder and urethra. What are the causes? Most urinary tract infections are caused by bacteria in your genital area around your urethra, where urine leaves your body. These bacteria grow and cause inflammation of your urinary tract. What increases the risk? You are more likely to develop this condition if: You have a urinary catheter that stays in place. You are not able to control when you urinate or have a bowel movement (incontinence). You are female and you: Use a spermicide or diaphragm for birth control. Have low estrogen levels. Are pregnant. You have certain genes that increase your risk. You are sexually active. You take antibiotic medicines. You have a condition that causes your flow of urine to slow down, such as: An enlarged prostate, if you are female. Blockage in your urethra. A kidney stone. A nerve condition that affects your bladder control (neurogenic bladder). Not getting enough to drink, or not urinating often. You have certain medical conditions, such as: Diabetes. A weak disease-fighting system (immunesystem). Sickle cell disease. Gout. Spinal cord injury. What are the signs or symptoms? Symptoms of this condition include: Needing to urinate right away (urgency). Frequent urination. This may include small amounts of urine each time you urinate. Pain or burning with urination. Blood in the urine. Urine that smells bad or unusual. Trouble urinating. Cloudy urine. Vaginal discharge, if you are female. Pain in the abdomen or the lower back. You may also have: Vomiting or a decreased appetite. Confusion. Irritability or tiredness. A fever or  chills. Diarrhea. The first symptom in older adults may be confusion. In some cases, they may not have any symptoms until the infection has worsened. How is this diagnosed? This condition is diagnosed based on your medical history and a physical exam. You may also have other tests, including: Urine tests. Blood tests. Tests for STIs (sexually transmitted infections). If you have had more than one UTI, a cystoscopy or imaging studies may be done to determine the cause of the infections. How is this treated? Treatment for this condition includes: Antibiotic medicine. Over-the-counter medicines to treat discomfort. Drinking enough water to stay hydrated. If you have frequent infections or have other conditions such as a kidney stone, you may need to see a health care provider who specializes in the urinary tract (urologist). In rare cases, urinary tract infections can cause sepsis. Sepsis is a life-threatening condition that occurs when the body responds to an infection. Sepsis is treated in the hospital with IV antibiotics, fluids, and other medicines. Follow these instructions at home: Medicines Take over-the-counter and prescription medicines only as told by your health care provider. If you were prescribed an antibiotic medicine, take it as told by your health care provider. Do not stop using the antibiotic even if you start to feel better. General instructions Make sure you: Empty your bladder often and completely. Do not hold urine for long periods of time. Empty your bladder after sex. Wipe from front to back after urinating or having a bowel movement if you are female. Use each tissue only one time when you wipe. Drink enough fluid to keep your urine pale yellow. Keep all follow-up visits. This is important. Contact a health care provider   if: Your symptoms do not get better after 1-2 days. Your symptoms go away and then return. Get help right away if: You have severe pain in your  back or your lower abdomen. You have a fever or chills. You have nausea or vomiting. Summary A urinary tract infection (UTI) is an infection of any part of the urinary tract, which includes the kidneys, ureters, bladder, and urethra. Most urinary tract infections are caused by bacteria in your genital area. Treatment for this condition often includes antibiotic medicines. If you were prescribed an antibiotic medicine, take it as told by your health care provider. Do not stop using the antibiotic even if you start to feel better. Keep all follow-up visits. This is important. This information is not intended to replace advice given to you by your health care provider. Make sure you discuss any questions you have with your health care provider. Document Revised: 10/22/2019 Document Reviewed: 10/22/2019 Elsevier Patient Education  2022 Elsevier Inc.  

## 2020-12-02 LAB — URINE CULTURE
MICRO NUMBER:: 12353553
SPECIMEN QUALITY:: ADEQUATE

## 2020-12-07 DIAGNOSIS — M9903 Segmental and somatic dysfunction of lumbar region: Secondary | ICD-10-CM | POA: Diagnosis not present

## 2020-12-07 DIAGNOSIS — M5432 Sciatica, left side: Secondary | ICD-10-CM | POA: Diagnosis not present

## 2020-12-15 ENCOUNTER — Other Ambulatory Visit: Payer: Self-pay

## 2020-12-15 ENCOUNTER — Encounter: Payer: Self-pay | Admitting: Family Medicine

## 2020-12-15 ENCOUNTER — Ambulatory Visit (INDEPENDENT_AMBULATORY_CARE_PROVIDER_SITE_OTHER): Payer: Medicare Other | Admitting: Family Medicine

## 2020-12-15 VITALS — BP 122/74 | HR 72 | Temp 97.8°F | Ht 60.0 in | Wt 145.8 lb

## 2020-12-15 DIAGNOSIS — M778 Other enthesopathies, not elsewhere classified: Secondary | ICD-10-CM | POA: Diagnosis not present

## 2020-12-15 DIAGNOSIS — N393 Stress incontinence (female) (male): Secondary | ICD-10-CM

## 2020-12-15 DIAGNOSIS — M79671 Pain in right foot: Secondary | ICD-10-CM

## 2020-12-15 DIAGNOSIS — G8929 Other chronic pain: Secondary | ICD-10-CM | POA: Diagnosis not present

## 2020-12-15 DIAGNOSIS — N390 Urinary tract infection, site not specified: Secondary | ICD-10-CM

## 2020-12-15 DIAGNOSIS — M79674 Pain in right toe(s): Secondary | ICD-10-CM

## 2020-12-15 DIAGNOSIS — M545 Low back pain, unspecified: Secondary | ICD-10-CM | POA: Diagnosis not present

## 2020-12-15 DIAGNOSIS — N3941 Urge incontinence: Secondary | ICD-10-CM

## 2020-12-15 NOTE — Progress Notes (Signed)
OFFICE VISIT  12/15/2020  CC:  Chief Complaint  Patient presents with   Follow-up    Urinary urgency; things have improved some. Does not feel any pressure or discomfort when urinating.    HPI:    Patient is a 70 y.o. Caucasian female who presents for f/u urinary complaints. I saw her 11/24/20 for acute urinary urgency and some mild urethral area discomfort with urination, UA moderate leuks o/w normal, rx'd bactrim x 5d, urine clx no growth. Pt's symptoms improved some but since not complete resolution she was seen by Dr. Etter Sjogren on 9/9, UA small leuks o/w normal, cipro rx'd, urine clx <10K gram neg.  INTERIM HX: She feels back to normal/baseline. Has chronic mild urge and stress incontinence. No dysuria or bladder pressure.  Still takes occasional/rare dose of tramadol for back pain.  Has R big toe pain x 6 mo, seems to extend over top of metatarsals/mid foot at times, occ she sees a little swelling on top of great toe, no redness or heat.  Says it causes a little limp. No injury prior.  Doesn't hurt where her mild bunion deformity is, no pain in web space of 1st and 2nd toes.   Past Medical History:  Diagnosis Date   Allergy    Borderline hyperlipidemia    F[place] CV risk 2017= 6%.   Cataract    Cerebral palsy ([place])    Colon polyp, hyperplastic 2004; 12/2015   Recall 12/2025   Difficulty sleeping    Endometrial adenocarcinoma ([place]) 09/2013   Dr. Denman George: pt got vaginal brachytherapy via rad onc.  No sign of dz recurrence as of 02/2016 Gyn Onc f/u.  Next f/u with them is 6 mo.   Morton's neuroma 2012   Dr. Roseanne Reno did injections in the past (left foot)   Osteoarthritis    LB, HIPs, knees (L knee end stage), hands   Osteopenia    DEXA 10/2017 was -1.5.  10/2019 T score -1.9.  Rpt 2 yrs.   Patellofemoral arthralgia of left knee 12/24/2013   Radiation 12/16/13, 12/22/13, 12/30/13, 01/06/14, 01/13/14   proximal vagina 30 gray   Right hip pain 2021   more radicular, like DDD  etiology->[facility]   Rotator cuff tendonitis 04/21/2013   Seasonal allergic rhinitis     Past Surgical History:  Procedure Laterality Date   COLONOSCOPY  09/2002; 12/2015   2004 hyperplastic; recall sent 2009 but pt apparently didn't respond.  12/2015: one sessile serrated polyp w/out cytologic atypia: recall 10 yrs per GI.   DEXA  08/2007; 11/14/15; 10/2017;10/2019   2009 T score -1.2.  2017 T score -1.6.  2019 T score -1.5. 10/2019 0-1.9.  Rpt 2 yrs   DILATION AND CURETTAGE OF UTERUS     LEG SURGERY Left 01/2014   "cleaned out cartilage"   ROBOTIC ASSISTED TOTAL HYSTERECTOMY WITH BILATERAL SALPINGO OOPHERECTOMY Bilateral 10/05/2013   Procedure: ROBOTIC ASSISTED TOTAL HYSTERECTOMY WITH BILATERAL SALPINGO OOPHORECTOMY WITH LYMPH NODE DISECTION;  Surgeon: Everitt Amber, MD;  Location: [facility];  Service: Gynecology;  Laterality: Bilateral;   TONSILLECTOMY     TOTAL KNEE ARTHROPLASTY Left 12/07/2014   Procedure: TOTAL KNEE ARTHROPLASTY;  Surgeon: Latanya Maudlin, MD;  Location: [facility];  Service: Orthopedics;  Laterality: Left;   TUBAL LIGATION      Outpatient Medications Prior to Visit  Medication Sig Dispense Refill   beta carotene w/minerals (OCUVITE) tablet Take 1 tablet by mouth every morning. Reported on 06/01/2015     CALCIUM PO Take  1 tablet by mouth daily.     cetirizine (ZYRTEC) 10 MG tablet Take 10 mg by mouth daily as needed for allergies. Reported on 06/01/2015     Cholecalciferol (VITAMIN D PO) Take 1 tablet by mouth daily.     CRANBERRY PO Take 4,200 mg by mouth. Take 2 capsules daily.     cyclobenzaprine (FLEXERIL) 10 MG tablet 1 tab po bid prn for musculoskeletal pain 30 tablet 5   docusate sodium (COLACE) 100 MG capsule Take 100 mg by mouth daily as needed for mild constipation. Reported on 06/01/2015     fluticasone (FLONASE) 50 MCG/ACT nasal spray Place 2 sprays into both nostrils daily. 16 g 6   meloxicam (MOBIC) 15 MG tablet Take 1 tablet (15 mg total) by mouth daily. 90 tablet  1   pantoprazole (PROTONIX) 40 MG tablet TAKE ONE TABLET BY MOUTH DAILY 90 tablet 3   polyethylene glycol (MIRALAX / GLYCOLAX) packet Take 17 g by mouth daily as needed for mild constipation. Reported on 06/01/2015     Probiotic Product (PROBIOTIC PO) Take 1 tablet by mouth every morning. Reported on 06/01/2015     vitamin B-12 (CYANOCOBALAMIN) 1000 MCG tablet Take 1,000 mcg by mouth daily.     No facility-administered medications prior to visit.    Allergies  Allergen Reactions   Aspirin Nausea Only    High dose only   Seasonal Ic [Cholestatin]     ROS As per HPI  PE: Vitals with BMI 12/15/2020 12/01/2020 11/24/2020  Height 5\' 0"  5\' 0"  5\' 0"   Weight 145 lbs 13 oz 145 lbs 6 oz 146 lbs 10 oz  BMI 28.47 02.7 74.12  Systolic 878 676 720  Diastolic 74 80 79  Pulse 72 73 72   Gen: Alert, well appearing.  Patient is oriented to person, place, time, and situation. AFFECT: pleasant, lucid thought and speech. R great toe prox phalanx mild TTP over dorsal surface, not particularly over IP or MTP jt.  No swelling or erythema.  Pain a bit worse with toe flexion.  Has mild TTP over mid metarsal region over 2,3,4--slightly worse with ankle extension and toes extension. Mild bunion deformity.  LABS:    Chemistry      Component Value Date/Time   NA 143 10/09/2020 1511   K 3.8 10/09/2020 1511   CL 107 10/09/2020 1511   CO2 22 10/09/2020 1511   BUN 17 10/09/2020 1511   CREATININE 0.86 10/09/2020 1511      Component Value Date/Time   CALCIUM 9.6 10/09/2020 1511   ALKPHOS 60 07/15/2019 0930   AST 22 08/11/2020 0908   ALT 17 08/11/2020 0908   BILITOT 0.5 08/11/2020 0908      IMPRESSION AND PLAN:  1) UTI: resolved.  2) Urge and stress incontinence: mild and stable. Limit caffeine, cont kegel's.  3) R toe/foot pain: symptoms and exam more c/w extensor tenosynovitis, lower susp osteoarthritis. She'll continue otc voltaren gel and she is in favor of seeing Dr. Delilah Shan in sports medicine to  see if any further w/u or treatment recommended.  4) Recurrent R LB pain w/out radiculopathy: rare use of tramadol.  No new rx needed today.  An After Visit Summary was printed and given to the patient.  FOLLOW UP: No follow-ups on file.  Signed:  Crissie Sickles, MD           12/15/2020

## 2020-12-27 DIAGNOSIS — M5432 Sciatica, left side: Secondary | ICD-10-CM | POA: Diagnosis not present

## 2020-12-27 DIAGNOSIS — M9903 Segmental and somatic dysfunction of lumbar region: Secondary | ICD-10-CM | POA: Diagnosis not present

## 2021-01-03 ENCOUNTER — Other Ambulatory Visit: Payer: Self-pay | Admitting: Family Medicine

## 2021-01-12 DIAGNOSIS — M79671 Pain in right foot: Secondary | ICD-10-CM | POA: Diagnosis not present

## 2021-02-09 ENCOUNTER — Other Ambulatory Visit: Payer: Self-pay

## 2021-02-09 ENCOUNTER — Ambulatory Visit (INDEPENDENT_AMBULATORY_CARE_PROVIDER_SITE_OTHER): Payer: Medicare Other | Admitting: Family Medicine

## 2021-02-09 ENCOUNTER — Encounter: Payer: Self-pay | Admitting: Family Medicine

## 2021-02-09 VITALS — BP 144/82 | HR 62 | Temp 97.5°F | Ht 60.0 in | Wt 146.0 lb

## 2021-02-09 DIAGNOSIS — M159 Polyosteoarthritis, unspecified: Secondary | ICD-10-CM

## 2021-02-09 DIAGNOSIS — Z23 Encounter for immunization: Secondary | ICD-10-CM

## 2021-02-09 DIAGNOSIS — M545 Low back pain, unspecified: Secondary | ICD-10-CM | POA: Diagnosis not present

## 2021-02-09 DIAGNOSIS — Z791 Long term (current) use of non-steroidal anti-inflammatories (NSAID): Secondary | ICD-10-CM | POA: Diagnosis not present

## 2021-02-09 DIAGNOSIS — N3946 Mixed incontinence: Secondary | ICD-10-CM

## 2021-02-09 DIAGNOSIS — Z5181 Encounter for therapeutic drug level monitoring: Secondary | ICD-10-CM | POA: Diagnosis not present

## 2021-02-09 LAB — BASIC METABOLIC PANEL WITH GFR
BUN: 22 mg/dL (ref 6–23)
CO2: 29 meq/L (ref 19–32)
Calcium: 9.3 mg/dL (ref 8.4–10.5)
Chloride: 103 meq/L (ref 96–112)
Creatinine, Ser: 0.7 mg/dL (ref 0.40–1.20)
GFR: 88.23 mL/min
Glucose, Bld: 90 mg/dL (ref 70–99)
Potassium: 4.3 meq/L (ref 3.5–5.1)
Sodium: 140 meq/L (ref 135–145)

## 2021-02-09 MED ORDER — DICLOFENAC SODIUM 1 % EX GEL: 2.0000 g | Freq: Four times a day (QID) | CUTANEOUS | 3 refills | Status: DC

## 2021-02-09 MED ORDER — ZOSTER VAC RECOMB ADJUVANTED 50 MCG/0.5ML IM SUSR: 0.5000 mL | Freq: Once | INTRAMUSCULAR | 0 refills | Status: AC

## 2021-02-09 NOTE — Progress Notes (Signed)
OFFICE VISIT  02/09/2021  CC:  Chief Complaint  Patient presents with   Follow-up    RCI, pt is fasting   HPI:    Patient is a 70 y.o. female who presents for f/u osteoarthritis multiple sites. I last saw her about 2 mo ago. A/P as of that visit: "1) UTI: resolved.   2) Urge and stress incontinence: mild and stable. Limit caffeine, cont kegel's.   3) R toe/foot pain: symptoms and exam more c/w extensor tenosynovitis, lower susp osteoarthritis. She'll continue otc voltaren gel and she is in favor of seeing Dr. Delilah Shan in sports medicine to see if any further w/u or treatment recommended.   4) Recurrent R LB pain w/out radiculopathy: rare use of tramadol.  No new rx needed today."  INTERIM HX: Natalie is doing very well.  Her back has not been bothering her over the last week at all.  She takes meloxicam every day because it does help with her arthritic pain in her right foot.  She went to a podiatrist since I last saw her and got x-rays and did confirm some arthritis in foot. She also got some over-the-counter Voltaren at the podiatrist recommendation and this does help some. Other areas that sometimes are uncomfortable for her R neck and hips and knees.  No radicular pain or paresthesias.   PMP AWARE reviewed today: most recent rx for tramadol was filled 06/30/19, # 30, rx by me. No red flags.  Past Medical History:  Diagnosis Date   Allergy    Borderline hyperlipidemia    Framingham CV risk 2017= 6%.   Cataract    Cerebral palsy ([place])    Colon polyp, hyperplastic 2004; 12/2015   Recall 12/2025   Difficulty sleeping    Endometrial adenocarcinoma ([place]) 09/2013   Dr. Denman George: pt got vaginal brachytherapy via rad onc.  No sign of dz recurrence as of 02/2016 Gyn Onc f/u.  Next f/u with them is 6 mo.   Morton's neuroma 2012   Dr. Roseanne Reno did injections in the past (left foot)   Osteoarthritis    LB, HIPs, knees (L knee end stage), hands   Osteopenia    DEXA 10/2017 was -1.5.   10/2019 T score -1.9.  Rpt 2 yrs.   Patellofemoral arthralgia of left knee 12/24/2013   Radiation 12/16/13, 12/22/13, 12/30/13, 01/06/14, 01/13/14   proximal vagina 30 gray   Right hip pain 2021   more radicular, like DDD etiology->PT, [facility]   Rotator cuff tendonitis 04/21/2013   Seasonal allergic rhinitis     Past Surgical History:  Procedure Laterality Date   COLONOSCOPY  09/2002; 12/2015   2004 hyperplastic; recall sent 2009 but pt apparently didn't respond.  12/2015: one sessile serrated polyp w/out cytologic atypia: recall 10 yrs per GI.   DEXA  08/2007; 11/14/15; 10/2017;10/2019   2009 T score -1.2.  2017 T score -1.6.  2019 T score -1.5. 10/2019 0-1.9.  Rpt 2 yrs   DILATION AND CURETTAGE OF UTERUS     LEG SURGERY Left 01/2014   "cleaned out cartilage"   ROBOTIC ASSISTED TOTAL HYSTERECTOMY WITH BILATERAL SALPINGO OOPHERECTOMY Bilateral 10/05/2013   Procedure: ROBOTIC ASSISTED TOTAL HYSTERECTOMY WITH BILATERAL SALPINGO OOPHORECTOMY WITH LYMPH NODE DISECTION;  Surgeon: Everitt Amber, MD;  Location: [facility];  Service: Gynecology;  Laterality: Bilateral;   TONSILLECTOMY     TOTAL KNEE ARTHROPLASTY Left 12/07/2014   Procedure: TOTAL KNEE ARTHROPLASTY;  Surgeon: Latanya Maudlin, MD;  Location: [facility];  Service: Orthopedics;  Laterality: Left;   TUBAL LIGATION      Outpatient Medications Prior to Visit  Medication Sig Dispense Refill   beta carotene w/minerals (OCUVITE) tablet Take 1 tablet by mouth every morning. Reported on 06/01/2015     CALCIUM PO Take 1 tablet by mouth daily.     cetirizine (ZYRTEC) 10 MG tablet Take 10 mg by mouth daily as needed for allergies. Reported on 06/01/2015     Cholecalciferol (VITAMIN D PO) Take 1 tablet by mouth daily.     CRANBERRY PO Take 4,200 mg by mouth. Take 2 capsules daily.     cyclobenzaprine (FLEXERIL) 10 MG tablet 1 tab po bid prn for musculoskeletal pain 30 tablet 5   docusate sodium (COLACE) 100 MG capsule Take 100 mg by mouth daily as needed for mild  constipation. Reported on 06/01/2015     fluticasone (FLONASE) 50 MCG/ACT nasal spray Place 2 sprays into both nostrils daily. 16 g 6   meloxicam (MOBIC) 15 MG tablet TAKE ONE TABLET BY MOUTH EVERY DAY 90 tablet 1   pantoprazole (PROTONIX) 40 MG tablet TAKE ONE TABLET BY MOUTH DAILY 90 tablet 3   polyethylene glycol (MIRALAX / GLYCOLAX) packet Take 17 g by mouth daily as needed for mild constipation. Reported on 06/01/2015     Probiotic Product (PROBIOTIC PO) Take 1 tablet by mouth every morning. Reported on 06/01/2015     vitamin B-12 (CYANOCOBALAMIN) 1000 MCG tablet Take 1,000 mcg by mouth daily.     No facility-administered medications prior to visit.    Allergies  Allergen Reactions   Aspirin Nausea Only    High dose only   Seasonal Ic [Cholestatin]     ROS As per HPI  PE: Vitals with BMI 02/09/2021 12/15/2020 12/01/2020  Height 5\' 0"  5\' 0"  5\' 0"   Weight 146 lbs 145 lbs 13 oz 145 lbs 6 oz  BMI 28.51 15.17 61.6  Systolic 073 710 626  Diastolic 82 74 80  Pulse 62 72 73     Gen: Alert, well appearing.  Patient is oriented to person, place, time, and situation. AFFECT: pleasant, lucid thought and speech. R ankle and foot w/out erythema or swelling or tenderness. ROM ankle and toes intact.     LABS:  Lab Results  Component Value Date   TSH 3.40 07/15/2019   Lab Results  Component Value Date   WBC 7.1 10/09/2020   HGB 14.4 10/09/2020   HCT 43.0 10/09/2020   MCV 90.3 10/09/2020   PLT 305 10/09/2020   Lab Results  Component Value Date   CREATININE 0.86 10/09/2020   BUN 17 10/09/2020   NA 143 10/09/2020   K 3.8 10/09/2020   CL 107 10/09/2020   CO2 22 10/09/2020   Lab Results  Component Value Date   ALT 17 08/11/2020   AST 22 08/11/2020   ALKPHOS 60 07/15/2019   BILITOT 0.5 08/11/2020   Lab Results  Component Value Date   CHOL 164 08/11/2020   Lab Results  Component Value Date   HDL 47 (L) 08/11/2020   Lab Results  Component Value Date   LDLCALC 100 (H)  08/11/2020   Lab Results  Component Value Date   TRIG 77 08/11/2020   Lab Results  Component Value Date   CHOLHDL 3.5 08/11/2020   Lab Results  Component Value Date   HGBA1C 5.8 08/09/2019    IMPRESSION AND PLAN:  #1 osteoarthritis multiple sites.  Right great toe seems to be the primary location as  well as lumbar spine. Meloxicam 15 mg a day is moderately helpful.  She takes a PPI some days but not others, depending on mostly dyspepsia or reflux symptoms. Monitoring rneal function regularly given that she is on NSAID daily.  Electrolytes and creatinine today.  Bedside ultrasound today confirmed some joint space narrowing and osteophyte of the right MTP jt.  An After Visit Summary was printed and given to the patient.  FOLLOW UP: Return in about 6 months (around 08/09/2021) for annual CPE (fasting).  Signed:  Crissie Sickles, MD           02/09/2021

## 2021-02-19 ENCOUNTER — Ambulatory Visit (INDEPENDENT_AMBULATORY_CARE_PROVIDER_SITE_OTHER): Payer: Medicare Other | Admitting: Family Medicine

## 2021-02-19 ENCOUNTER — Encounter: Payer: Self-pay | Admitting: Family Medicine

## 2021-02-19 ENCOUNTER — Other Ambulatory Visit: Payer: Self-pay

## 2021-02-19 VITALS — BP 131/78 | HR 76 | Temp 98.0°F | Ht 60.0 in | Wt 143.4 lb

## 2021-02-19 DIAGNOSIS — S30860A Insect bite (nonvenomous) of lower back and pelvis, initial encounter: Secondary | ICD-10-CM | POA: Diagnosis not present

## 2021-02-19 DIAGNOSIS — W57XXXA Bitten or stung by nonvenomous insect and other nonvenomous arthropods, initial encounter: Secondary | ICD-10-CM | POA: Diagnosis not present

## 2021-02-19 DIAGNOSIS — L918 Other hypertrophic disorders of the skin: Secondary | ICD-10-CM | POA: Diagnosis not present

## 2021-02-19 DIAGNOSIS — D225 Melanocytic nevi of trunk: Secondary | ICD-10-CM | POA: Diagnosis not present

## 2021-02-19 MED ORDER — DOXYCYCLINE HYCLATE 100 MG PO TABS: ORAL_TABLET | ORAL | 0 refills | Status: DC

## 2021-02-19 NOTE — Progress Notes (Signed)
OFFICE VISIT  02/19/2021  CC:  Chief Complaint  Patient presents with   Follow-up    Pt c/o bump on lower left side of back w/ redness, itchiness, some swelling x1 day   HPI:    Patient is a 70 y.o. female who presents for "bump on my back".  INTERIM HX: Kelsey Bruce noted a little red bump on her back today in the mirror.  No itching. Has felt well lately.  Specifically has not had any headache, neck stiffness, fever, malaise, or rash. She also has a skin tag on left low back area that bothers her intermittently when she rubs a towel against she would like removed.  Past Medical History:  Diagnosis Date   Allergy    Borderline hyperlipidemia    Framingham CV risk 2017= 6%.   Cataract    Cerebral palsy ([place])    Colon polyp, hyperplastic 2004; 12/2015   Recall 12/2025   Difficulty sleeping    Endometrial adenocarcinoma ([place]) 09/2013   Dr. Denman George: pt got vaginal brachytherapy via rad onc.  No sign of dz recurrence as of 02/2016 Gyn Onc f/u.  Next f/u with them is 6 mo.   Morton's neuroma 2012   Dr. Roseanne Reno did injections in the past (left foot)   Osteoarthritis    LB, HIPs, knees (L knee end stage), hands   Osteopenia    DEXA 10/2017 was -1.5.  10/2019 T score -1.9.  Rpt 2 yrs.   Patellofemoral arthralgia of left knee 12/24/2013   Radiation 12/16/13, 12/22/13, 12/30/13, 01/06/14, 01/13/14   proximal vagina 30 gray   Right hip pain 2021   more radicular, like DDD etiology->[facility], [facility]   Rotator cuff tendonitis 04/21/2013   Seasonal allergic rhinitis     Past Surgical History:  Procedure Laterality Date   COLONOSCOPY  09/2002; 12/2015   2004 hyperplastic; recall sent 2009 but pt apparently didn't respond.  12/2015: one sessile serrated polyp w/out cytologic atypia: recall 10 yrs per GI.   DEXA  08/2007; 11/14/15; 10/2017;10/2019   2009 T score -1.2.  2017 T score -1.6.  2019 T score -1.5. 10/2019 0-1.9.  Rpt 2 yrs   DILATION AND CURETTAGE OF UTERUS     LEG SURGERY Left 01/2014    "cleaned out cartilage"   ROBOTIC ASSISTED TOTAL HYSTERECTOMY WITH BILATERAL SALPINGO OOPHERECTOMY Bilateral 10/05/2013   Procedure: ROBOTIC ASSISTED TOTAL HYSTERECTOMY WITH BILATERAL SALPINGO OOPHORECTOMY WITH LYMPH NODE DISECTION;  Surgeon: Everitt Amber, MD;  Location: [facility];  Service: Gynecology;  Laterality: Bilateral;   TONSILLECTOMY     TOTAL KNEE ARTHROPLASTY Left 12/07/2014   Procedure: TOTAL KNEE ARTHROPLASTY;  Surgeon: Latanya Maudlin, MD;  Location: [facility];  Service: Orthopedics;  Laterality: Left;   TUBAL LIGATION      Outpatient Medications Prior to Visit  Medication Sig Dispense Refill   beta carotene w/minerals (OCUVITE) tablet Take 1 tablet by mouth every morning. Reported on 06/01/2015     CALCIUM PO Take 1 tablet by mouth daily.     cetirizine (ZYRTEC) 10 MG tablet Take 10 mg by mouth daily as needed for allergies. Reported on 06/01/2015     Cholecalciferol (VITAMIN D PO) Take 1 tablet by mouth daily.     CRANBERRY PO Take 4,200 mg by mouth. Take 2 capsules daily.     cyclobenzaprine (FLEXERIL) 10 MG tablet 1 tab po bid prn for musculoskeletal pain 30 tablet 5   diclofenac Sodium (VOLTAREN) 1 % GEL Apply 2 g topically 4 (four) times  daily. 100 g 3   docusate sodium (COLACE) 100 MG capsule Take 100 mg by mouth daily as needed for mild constipation. Reported on 06/01/2015     fluticasone (FLONASE) 50 MCG/ACT nasal spray Place 2 sprays into both nostrils daily. 16 g 6   meloxicam (MOBIC) 15 MG tablet TAKE ONE TABLET BY MOUTH EVERY DAY 90 tablet 1   pantoprazole (PROTONIX) 40 MG tablet TAKE ONE TABLET BY MOUTH DAILY 90 tablet 3   polyethylene glycol (MIRALAX / GLYCOLAX) packet Take 17 g by mouth daily as needed for mild constipation. Reported on 06/01/2015     Probiotic Product (PROBIOTIC PO) Take 1 tablet by mouth every morning. Reported on 06/01/2015     vitamin B-12 (CYANOCOBALAMIN) 1000 MCG tablet Take 1,000 mcg by mouth daily.     No facility-administered medications prior to visit.     Allergies  Allergen Reactions   Aspirin Nausea Only    High dose only   Seasonal Ic [Cholestatin]     ROS As per HPI  PE: Vitals with BMI 02/19/2021 02/09/2021 12/15/2020  Height 5\' 0"  5\' 0"  5\' 0"   Weight 143 lbs 6 oz 146 lbs 145 lbs 13 oz  BMI 28.01 74.82 70.78  Systolic 675 449 201  Diastolic 78 82 74  Pulse 76 62 72     Gen: Alert, well appearing.  Patient is oriented to person, place, time, and situation. AFFECT: pleasant, lucid thought and speech. Skin: Left low back area with tick embedded, with a oval erythematous circumference.  This measures 1/2 inch x 1-1/2 inches. About 2 inches below this there is a flesh-colored soft pedunculated lesion about 1 cm in size  LABS:  none  IMPRESSION AND PLAN:  #1 tick bite.  Tick was removed today and it does appear to be a black legged tick.  Risk of Lyme disease.  Unknown how long the tick has been on.  We will do a 200 mg dose of doxycycline and have her back in a few days to monitor this red area. Signs/symptoms to call or return for were reviewed and pt expressed understanding.  2.  Acrochordon.  Excised today. Consent obtained.  Area prepped with Betadine.  2 cc of 2% lidocaine with epi was infiltrated for local anesthesia.  Derma blade used to shave lesion at the base and placed in specimen cup.  Patient tolerated procedure well.  No immediate complications.  Home wound care instructions given.  An After Visit Summary was printed and given to the patient.  FOLLOW UP: No follow-ups on file.  Signed:  Crissie Sickles, MD           02/19/2021

## 2021-02-22 ENCOUNTER — Ambulatory Visit (INDEPENDENT_AMBULATORY_CARE_PROVIDER_SITE_OTHER): Payer: Medicare Other | Admitting: Family Medicine

## 2021-02-22 ENCOUNTER — Other Ambulatory Visit: Payer: Self-pay

## 2021-02-22 ENCOUNTER — Encounter: Payer: Self-pay | Admitting: Family Medicine

## 2021-02-22 VITALS — BP 133/77 | HR 63 | Temp 97.4°F | Ht 60.0 in | Wt 143.0 lb

## 2021-02-22 DIAGNOSIS — S30860D Insect bite (nonvenomous) of lower back and pelvis, subsequent encounter: Secondary | ICD-10-CM

## 2021-02-22 DIAGNOSIS — W57XXXD Bitten or stung by nonvenomous insect and other nonvenomous arthropods, subsequent encounter: Secondary | ICD-10-CM | POA: Diagnosis not present

## 2021-02-22 NOTE — Progress Notes (Signed)
OFFICE VISIT  02/22/2021  CC:  Chief Complaint  Patient presents with   Follow-up    Tick bite   HPI:    Patient is a 70 y.o. female who presents for 3 d f/u tick bite. A/P as of last visit: "1. Tick bite.  Tick was removed today and it does appear to be a black legged tick.  Risk of Lyme disease.  Unknown how long the tick has been on.  We will do a 200 mg dose of doxycycline and have her back in a few days to monitor this red area. Signs/symptoms to call or return for were reviewed and pt expressed understanding.  2.  Acrochordon.  Excised today. Consent obtained.  Area prepped with Betadine.  2 cc of 2% lidocaine with epi was infiltrated for local anesthesia.  Derma blade used to shave lesion at the base and placed in specimen cup.  Patient tolerated procedure well.  No immediate complications.  Home wound care instructions given."  INTERIM HX: Feeling well. The doxy caused some nausea but she kept it down. The area of the tick bite is signif improved.  Also the area where I removed her skin tag is healing up fine.  No new concerns.  Past Medical History:  Diagnosis Date   Allergy    Borderline hyperlipidemia    Framingham CV risk 2017= 6%.   Cataract    Cerebral palsy ([place])    Colon polyp, hyperplastic 2004; 12/2015   Recall 12/2025   Difficulty sleeping    Endometrial adenocarcinoma ([place]) 09/2013   Dr. Denman George: pt got vaginal brachytherapy via rad onc.  No sign of dz recurrence as of 02/2016 Gyn Onc f/u.  Next f/u with them is 6 mo.   Morton's neuroma 2012   Dr. Roseanne Reno did injections in the past (left foot)   Osteoarthritis    LB, HIPs, knees (L knee end stage), hands   Osteopenia    DEXA 10/2017 was -1.5.  10/2019 T score -1.9.  Rpt 2 yrs.   Patellofemoral arthralgia of left knee 12/24/2013   Radiation 12/16/13, 12/22/13, 12/30/13, 01/06/14, 01/13/14   proximal vagina 30 gray   Right hip pain 2021   more radicular, like DDD etiology->PT, [facility]   Rotator cuff  tendonitis 04/21/2013   Seasonal allergic rhinitis     Past Surgical History:  Procedure Laterality Date   COLONOSCOPY  09/2002; 12/2015   2004 hyperplastic; recall sent 2009 but pt apparently didn't respond.  12/2015: one sessile serrated polyp w/out cytologic atypia: recall 10 yrs per GI.   DEXA  08/2007; 11/14/15; 10/2017;10/2019   2009 T score -1.2.  2017 T score -1.6.  2019 T score -1.5. 10/2019 0-1.9.  Rpt 2 yrs   DILATION AND CURETTAGE OF UTERUS     LEG SURGERY Left 01/2014   "cleaned out cartilage"   ROBOTIC ASSISTED TOTAL HYSTERECTOMY WITH BILATERAL SALPINGO OOPHERECTOMY Bilateral 10/05/2013   Procedure: ROBOTIC ASSISTED TOTAL HYSTERECTOMY WITH BILATERAL SALPINGO OOPHORECTOMY WITH LYMPH NODE DISECTION;  Surgeon: Everitt Amber, MD;  Location: [facility];  Service: Gynecology;  Laterality: Bilateral;   TONSILLECTOMY     TOTAL KNEE ARTHROPLASTY Left 12/07/2014   Procedure: TOTAL KNEE ARTHROPLASTY;  Surgeon: Latanya Maudlin, MD;  Location: [facility];  Service: Orthopedics;  Laterality: Left;   TUBAL LIGATION      Outpatient Medications Prior to Visit  Medication Sig Dispense Refill   beta carotene w/minerals (OCUVITE) tablet Take 1 tablet by mouth every morning. Reported on 06/01/2015  CALCIUM PO Take 1 tablet by mouth daily.     cetirizine (ZYRTEC) 10 MG tablet Take 10 mg by mouth daily as needed for allergies. Reported on 06/01/2015     Cholecalciferol (VITAMIN D PO) Take 1 tablet by mouth daily.     CRANBERRY PO Take 4,200 mg by mouth. Take 2 capsules daily.     cyclobenzaprine (FLEXERIL) 10 MG tablet 1 tab po bid prn for musculoskeletal pain 30 tablet 5   diclofenac Sodium (VOLTAREN) 1 % GEL Apply 2 g topically 4 (four) times daily. 100 g 3   docusate sodium (COLACE) 100 MG capsule Take 100 mg by mouth daily as needed for mild constipation. Reported on 06/01/2015     fluticasone (FLONASE) 50 MCG/ACT nasal spray Place 2 sprays into both nostrils daily. 16 g 6   meloxicam (MOBIC) 15 MG tablet TAKE  ONE TABLET BY MOUTH EVERY DAY 90 tablet 1   pantoprazole (PROTONIX) 40 MG tablet TAKE ONE TABLET BY MOUTH DAILY 90 tablet 3   polyethylene glycol (MIRALAX / GLYCOLAX) packet Take 17 g by mouth daily as needed for mild constipation. Reported on 06/01/2015     Probiotic Product (PROBIOTIC PO) Take 1 tablet by mouth every morning. Reported on 06/01/2015     vitamin B-12 (CYANOCOBALAMIN) 1000 MCG tablet Take 1,000 mcg by mouth daily.     doxycycline (VIBRA-TABS) 100 MG tablet 2 tabs po x 1 dose (Patient not taking: Reported on 02/22/2021) 2 tablet 0   No facility-administered medications prior to visit.    Allergies  Allergen Reactions   Aspirin Nausea Only    High dose only   Seasonal Ic [Cholestatin]     ROS As per HPI  PE: Vitals with BMI 02/22/2021 02/19/2021 02/09/2021  Height 5\' 0"  5\' 0"  5\' 0"   Weight 143 lbs 143 lbs 6 oz 146 lbs  BMI 27.93 38.18 29.93  Systolic 716 967 893  Diastolic 77 78 82  Pulse 63 76 62   Gen: Alert, well appearing.  Patient is oriented to person, place, time, and situation. AFFECT: pleasant, lucid thought and speech. SKIN: left low back region with punctate scab where tick was located, with only 1-2 mm circumferential pinkish hue of skin around it.  No induration or tenderness. Just below this is the 1 cm epithelial divot from skin tag removal, dry wound bed, no erythema or tenderness.  LABS:    Chemistry      Component Value Date/Time   NA 140 02/09/2021 1014   K 4.3 02/09/2021 1014   CL 103 02/09/2021 1014   CO2 29 02/09/2021 1014   BUN 22 02/09/2021 1014   CREATININE 0.70 02/09/2021 1014   CREATININE 0.86 10/09/2020 1511      Component Value Date/Time   CALCIUM 9.3 02/09/2021 1014   ALKPHOS 60 07/15/2019 0930   AST 22 08/11/2020 0908   ALT 17 08/11/2020 0908   BILITOT 0.5 08/11/2020 0908      IMPRESSION AND PLAN:  1) Tick bite, black legged tick--->risk of lyme dz transmission. She got the postexposure prophylaxis doxy dose. No signs  of illness, the bite is healing approp, no rash.  Skin tag excised last visit, wound bed looks good. Path pending.  An After Visit Summary was printed and given to the patient.  FOLLOW UP: Return if symptoms worsen or fail to improve.  Signed:  Crissie Sickles, MD           02/22/2021

## 2021-08-09 ENCOUNTER — Ambulatory Visit (INDEPENDENT_AMBULATORY_CARE_PROVIDER_SITE_OTHER): Payer: Medicare Other | Admitting: Family Medicine

## 2021-08-09 ENCOUNTER — Ambulatory Visit (HOSPITAL_BASED_OUTPATIENT_CLINIC_OR_DEPARTMENT_OTHER)
Admission: RE | Admit: 2021-08-09 | Discharge: 2021-08-09 | Disposition: A | Discharge status: Home / Self Care | Payer: Medicare Other | Source: Ambulatory Visit | Attending: Family Medicine | Admitting: Family Medicine

## 2021-08-09 ENCOUNTER — Other Ambulatory Visit (HOSPITAL_BASED_OUTPATIENT_CLINIC_OR_DEPARTMENT_OTHER): Payer: Self-pay | Admitting: Family Medicine

## 2021-08-09 ENCOUNTER — Telehealth: Payer: Self-pay

## 2021-08-09 ENCOUNTER — Encounter: Payer: Self-pay | Admitting: Family Medicine

## 2021-08-09 VITALS — BP 129/79 | HR 65 | Temp 97.5°F | Ht 59.25 in | Wt 140.2 lb

## 2021-08-09 DIAGNOSIS — R1032 Left lower quadrant pain: Secondary | ICD-10-CM

## 2021-08-09 DIAGNOSIS — R3915 Urgency of urination: Secondary | ICD-10-CM | POA: Diagnosis not present

## 2021-08-09 DIAGNOSIS — R5383 Other fatigue: Secondary | ICD-10-CM | POA: Diagnosis not present

## 2021-08-09 DIAGNOSIS — R109 Unspecified abdominal pain: Secondary | ICD-10-CM | POA: Diagnosis not present

## 2021-08-09 DIAGNOSIS — E2839 Other primary ovarian failure: Secondary | ICD-10-CM | POA: Diagnosis not present

## 2021-08-09 DIAGNOSIS — K5909 Other constipation: Secondary | ICD-10-CM

## 2021-08-09 DIAGNOSIS — Z0000 Encounter for general adult medical examination without abnormal findings: Secondary | ICD-10-CM | POA: Diagnosis not present

## 2021-08-09 DIAGNOSIS — Z1231 Encounter for screening mammogram for malignant neoplasm of breast: Secondary | ICD-10-CM

## 2021-08-09 DIAGNOSIS — R8290 Unspecified abnormal findings in urine: Secondary | ICD-10-CM | POA: Diagnosis not present

## 2021-08-09 DIAGNOSIS — Z791 Long term (current) use of non-steroidal anti-inflammatories (NSAID): Secondary | ICD-10-CM

## 2021-08-09 DIAGNOSIS — Z23 Encounter for immunization: Secondary | ICD-10-CM

## 2021-08-09 DIAGNOSIS — E785 Hyperlipidemia, unspecified: Secondary | ICD-10-CM

## 2021-08-09 DIAGNOSIS — M8580 Other specified disorders of bone density and structure, unspecified site: Secondary | ICD-10-CM

## 2021-08-09 DIAGNOSIS — K5900 Constipation, unspecified: Secondary | ICD-10-CM | POA: Diagnosis not present

## 2021-08-09 LAB — POCT URINALYSIS DIPSTICK
Bilirubin, UA: NEGATIVE
Blood, UA: NEGATIVE
Glucose, UA: NEGATIVE
Ketones, UA: NEGATIVE
Nitrite, UA: NEGATIVE
Protein, UA: NEGATIVE
Spec Grav, UA: 1.015 (ref 1.010–1.025)
Urobilinogen, UA: 0.2 E.U./dL
pH, UA: 6 (ref 5.0–8.0)

## 2021-08-09 MED ORDER — SULFAMETHOXAZOLE-TRIMETHOPRIM 800-160 MG PO TABS: 1.0000 | ORAL_TABLET | Freq: Two times a day (BID) | ORAL | 0 refills | Status: DC

## 2021-08-09 MED ORDER — PANTOPRAZOLE SODIUM 40 MG PO TBEC: 40.0000 mg | DELAYED_RELEASE_TABLET | Freq: Every day | ORAL | 1 refills | Status: DC

## 2021-08-09 MED ORDER — ZOSTER VAC RECOMB ADJUVANTED 50 MCG/0.5ML IM SUSR: 0.5000 mL | Freq: Once | INTRAMUSCULAR | 0 refills | Status: AC

## 2021-08-09 NOTE — Addendum Note (Signed)
Addended by: Deveron Furlong D on: 08/09/2021 04:46 PM   Modules accepted: Orders

## 2021-08-09 NOTE — Patient Instructions (Addendum)
Take 1 capful miralax in 6-8 oz of water three times per day

## 2021-08-09 NOTE — Progress Notes (Signed)
Office Note 08/09/2021  CC:  Chief Complaint  Patient presents with   Annual Exam    HPI:  Patient is a 71 y.o. female who is here for annual health maintenance exam.  Unfortunately Kelsey Bruce has not been feeling well for the last 1 week or so.  She complains of pain in the left lower abdominal region, sometimes extending around the left side.  She has diffuse low back pain which is waxing and waning and this is her baseline.  Symptoms coincided with the worsening of her chronic constipation.  Last normal bowel movement was about a week ago.  2 days ago she had a small amount of slightly liquid stool.  Usually takes 1 capful of MiraLAX every other day or so for her chronic constipation, but has been trying to take this more frequently lately and has taken Dulcolax on a few occasions during the last week as well. Her temperature was 100.3 at some point during this.  She also has had some intermittent suprapubic pressure.  Question of increase in her typical level of urinary urgency lately.  No dysuria, no hematuria.  Generally feels some intermittent lightheadedness.  Feels quite tired lately. Not wanting to get out much to do things lately. Endorses markedly increased stress and anxiety lately due to troubles taking care of a woman who has severe Alzheimer's disease.  ROS as above, plus--> no CP, no SOB, no wheezing, no cough, no dizziness, no HAs, no rashes, no melena/hematochezia.  No polyuria or polydipsia.  No myalgias.  Chronic left knee pain unchanged.  No focal weakness, paresthesias, or tremors.  No acute vision or hearing abnormalities.  No dysuria or unusual/new urinary urgency or frequency.  No recent changes in lower legs. No n/v/d or abd pain.  No palpitations.      Past Medical History:  Diagnosis Date   Allergy    Borderline hyperlipidemia    F[place] CV risk 2017= 6%.   Cataract    Cerebral palsy ([place])    Colon polyp, hyperplastic 2004; 12/2015   Recall 12/2025    Difficulty sleeping    Endometrial adenocarcinoma ([place]) 09/2013   Dr. Denman George: pt got vaginal brachytherapy via rad onc.  No sign of dz recurrence as of 02/2016 Gyn Onc f/u.  Next f/u with them is 6 mo.   Morton's neuroma 2012   Dr. Roseanne Reno did injections in the past (left foot)   Osteoarthritis    LB, HIPs, knees (L knee end stage), hands   Osteopenia    DEXA 10/2017 was -1.5.  10/2019 T score -1.9.  Rpt 2 yrs.   Patellofemoral arthralgia of left knee 12/24/2013   Radiation 12/16/13, 12/22/13, 12/30/13, 01/06/14, 01/13/14   proximal vagina 30 gray   Right hip pain 2021   more radicular, like DDD etiology->PT, [facility]   Rotator cuff tendonitis 04/21/2013   Seasonal allergic rhinitis     Past Surgical History:  Procedure Laterality Date   COLONOSCOPY  09/2002; 12/2015   2004 hyperplastic; recall sent 2009 but pt apparently didn't respond.  12/2015: one sessile serrated polyp w/out cytologic atypia: recall 10 yrs per GI.   DEXA  08/2007; 11/14/15; 10/2017;10/2019   2009 T score -1.2.  2017 T score -1.6.  2019 T score -1.5. 10/2019 0-1.9.  Rpt 2 yrs   DILATION AND CURETTAGE OF UTERUS     LEG SURGERY Left 01/2014   "cleaned out cartilage"   ROBOTIC ASSISTED TOTAL HYSTERECTOMY WITH BILATERAL SALPINGO OOPHERECTOMY Bilateral 10/05/2013  Procedure: ROBOTIC ASSISTED TOTAL HYSTERECTOMY WITH BILATERAL SALPINGO OOPHORECTOMY WITH LYMPH NODE DISECTION;  Surgeon: Everitt Amber, MD;  Location: [facility];  Service: Gynecology;  Laterality: Bilateral;   TONSILLECTOMY     TOTAL KNEE ARTHROPLASTY Left 12/07/2014   Procedure: TOTAL KNEE ARTHROPLASTY;  Surgeon: Latanya Maudlin, MD;  Location: [facility];  Service: Orthopedics;  Laterality: Left;   TUBAL LIGATION      Family History  Problem Relation Age of Onset   Heart disease Father    Hypertension Father    Cancer Father        lung   Arthritis Father    Alcohol abuse Father    Heart disease Mother    Cancer Mother        lung   Deep vein thrombosis Mother     Alcohol abuse Mother    Heart disease Brother    Hypertension Brother    Heart disease Brother    Hypertension Brother    Heart disease Brother    Hypertension Brother    Stomach cancer Maternal Grandfather    Colon cancer Neg Hx    Esophageal cancer Neg Hx    Rectal cancer Neg Hx     Social History   Socioeconomic History   Marital status: Divorced    Spouse name: Not on file   Number of children: 2   Years of education: Not on file   Highest education level: 12th grade  Occupational History   Occupation: retired  Tobacco Use   Smoking status: Never   Smokeless tobacco: Never  Vaping Use   Vaping Use: Never used  Substance and Sexual Activity   Alcohol use: No    Comment: occasional   Drug use: No   Sexual activity: Not on file  Other Topics Concern   Not on file  Social History Narrative   Divorced.  Two children.   Orig from [place], [place].   HS education.   Retired.   No tobacco, occ alcohol, no drugs.   Has 3 brothers and they all smoked, as did her parents.   Social Determinants of Health   Financial Resource Strain: Low Risk    Difficulty of Paying Living Expenses: Not very hard  Food Insecurity: No Food Insecurity   Worried About Charity fundraiser in the Last Year: Never true   Ran Out of Food in the Last Year: Never true  Transportation Needs: No Transportation Needs   Lack of Transportation (Medical): No   Lack of Transportation (Non-Medical): No  Physical Activity: Insufficiently Active   Days of Exercise per Week: 3 days   Minutes of Exercise per Session: 10 min  Stress: No Stress Concern Present   Feeling of Stress : Only a little  Social Connections: Moderately Integrated   Frequency of Communication with Friends and Family: More than three times a week   Frequency of Social Gatherings with Friends and Family: More than three times a week   Attends Religious Services: More than 4 times per year   Active Member of [employer] or Organizations:  Yes   Attends Music therapist: More than 4 times per year   Marital Status: Divorced  Human resources officer Violence: Not At Risk   Fear of Current or Ex-Partner: No   Emotionally Abused: No   Physically Abused: No   Sexually Abused: No    Outpatient Medications Prior to Visit  Medication Sig Dispense Refill   beta carotene w/minerals (OCUVITE) tablet Take 1 tablet by  mouth every morning. Reported on 06/01/2015     CALCIUM PO Take 1 tablet by mouth daily.     cetirizine (ZYRTEC) 10 MG tablet Take 10 mg by mouth daily as needed for allergies. Reported on 06/01/2015     Cholecalciferol (VITAMIN D PO) Take 1 tablet by mouth daily.     CRANBERRY PO Take 4,200 mg by mouth. Take 2 capsules daily.     cyclobenzaprine (FLEXERIL) 10 MG tablet 1 tab po bid prn for musculoskeletal pain 30 tablet 5   diclofenac Sodium (VOLTAREN) 1 % GEL Apply 2 g topically 4 (four) times daily. 100 g 3   docusate sodium (COLACE) 100 MG capsule Take 100 mg by mouth daily as needed for mild constipation. Reported on 06/01/2015     fluticasone (FLONASE) 50 MCG/ACT nasal spray Place 2 sprays into both nostrils daily. 16 g 6   meloxicam (MOBIC) 15 MG tablet TAKE ONE TABLET BY MOUTH EVERY DAY 90 tablet 1   pantoprazole (PROTONIX) 40 MG tablet TAKE ONE TABLET BY MOUTH DAILY 90 tablet 3   polyethylene glycol (MIRALAX / GLYCOLAX) packet Take 17 g by mouth daily as needed for mild constipation. Reported on 06/01/2015     Probiotic Product (PROBIOTIC PO) Take 1 tablet by mouth every morning. Reported on 06/01/2015     vitamin B-12 (CYANOCOBALAMIN) 1000 MCG tablet Take 1,000 mcg by mouth daily.     No facility-administered medications prior to visit.    Allergies  Allergen Reactions   Aspirin Nausea Only    High dose only   Seasonal Ic [Cholestatin]    PE;    08/09/2021   10:35 AM 02/22/2021   11:32 AM 02/19/2021    2:37 PM  Vitals with BMI  Height 4' 11.25" 5' 0"  5' 0"   Weight 140 lbs 3 oz 143 lbs 143 lbs 6 oz   BMI 28.08 19.37 90.24  Systolic 097 353 299  Diastolic 79 77 78  Pulse 65 63 76   Exam chaperoned by Shepard General, CMA Gen: Alert, well appearing.  Patient is oriented to person, place, time, and situation. AFFECT: pleasant, lucid thought and speech. ENT: Ears: EACs clear, normal epithelium.  TMs with good light reflex and landmarks bilaterally.  Eyes: no injection, icteris, swelling, or exudate.  EOMI, PERRLA. Nose: no drainage or turbinate edema/swelling.  No injection or focal lesion.  Mouth: lips without lesion/swelling.  Oral mucosa pink and moist.  Dentition intact and without obvious caries or gingival swelling.  Oropharynx without erythema, exudate, or swelling.  Neck: supple/nontender.  No LAD, mass, or TM.  Carotid pulses 2+ bilaterally, without bruits. CV: RRR, no m/r/g.   LUNGS: CTA bilat, nonlabored resps, good aeration in all lung fields. ABD: soft, some mild nonfocal discomfort to palpation, ND, BS normal.  No hepatospenomegaly or mass.  No bruits. EXT: no clubbing, cyanosis, or edema.  Musculoskeletal: no joint swelling, erythema, warmth, or tenderness.  ROM of all joints intact. Skin - no sores or suspicious lesions or rashes or color changes  Pertinent labs:  Lab Results  Component Value Date   TSH 3.40 07/15/2019   Lab Results  Component Value Date   WBC 7.1 10/09/2020   HGB 14.4 10/09/2020   HCT 43.0 10/09/2020   MCV 90.3 10/09/2020   PLT 305 10/09/2020   Lab Results  Component Value Date   CREATININE 0.70 02/09/2021   BUN 22 02/09/2021   NA 140 02/09/2021   K 4.3 02/09/2021   CL 103 02/09/2021  CO2 29 02/09/2021   Lab Results  Component Value Date   ALT 17 08/11/2020   AST 22 08/11/2020   ALKPHOS 60 07/15/2019   BILITOT 0.5 08/11/2020   Lab Results  Component Value Date   CHOL 164 08/11/2020   Lab Results  Component Value Date   HDL 47 (L) 08/11/2020   Lab Results  Component Value Date   LDLCALC 100 (H) 08/11/2020   Lab Results   Component Value Date   TRIG 77 08/11/2020   Lab Results  Component Value Date   CHOLHDL 3.5 08/11/2020   Lab Results  Component Value Date   HGBA1C 5.8 08/09/2019   POC CC dipstick UA today : 1+ leuks, o/w normal  ASSESSMENT AND PLAN:   #1 abdominal pain.  Chronic constipation which is acutely worse. Soft signs of UTI, trace abnormality on UA. At this point only to try to get her bowels moving better with taking MiraLAX 1 capful 3 times daily.  Check KUB today. Additionally, will treat with Bactrim double strength 1 twice daily x3 days. If not significantly improving over the next week or if worsening prior to the follow-up in 1 week have to consider acute diverticulitis. Checking CBC and c-Met today Urine sent for culture and sensitivities.  #2 adjustment disorder with anxiety and depressed mood. She is pretty overwhelmed with taking care a woman with severe Alzheimer's.  She is considering cutting back on her care for her if not quitting altogether. Certainly this is affecting her physically as well-- more headaches, lightheadedness, and fatigue.  #3 osteoarthritis, multiple sites--low back, hips, knees, hands. She does take meloxicam 15 mg a day. Monitoring renal function today.  #4 Health maintenance exam: Reviewed age and gender appropriate health maintenance issues (prudent diet, regular exercise, health risks of tobacco and excessive alcohol, use of seatbelts, fire alarms in home, use of sunscreen).  Also reviewed age and gender appropriate health screening as well as vaccine recommendations. Vaccines: ALL UTD except shingrix-->rx sent again. Labs: cmet, cbc, flp, TSH. Cervical ca screening: hx of endometrial ca, pt s/p TAH/BSO 5 yrs ago.  F/u with GYN MD. Breast ca screening: next mammo due 11/2021. Osteoporosis screening: hx of osteopenia.  Plan repeat DEXA 11/2021-ordered today. Colon ca screening: recall 2027. She takes ca and vit D supp daily.  An After Visit  Summary was printed and given to the patient.  FOLLOW UP:  Return in about 6 months (around 02/09/2022) for routine chronic illness f/u.  Signed:  Crissie Sickles, MD           08/09/2021

## 2021-08-09 NOTE — Telephone Encounter (Signed)
Unfortunately lab will not be able to process blood samples from earlier today. Pt will need to have labs recollected. LM for pt to CB. Labs re-ordered.

## 2021-08-10 LAB — URINE CULTURE
MICRO NUMBER:: 13414899
SPECIMEN QUALITY:: ADEQUATE

## 2021-08-15 ENCOUNTER — Ambulatory Visit (INDEPENDENT_AMBULATORY_CARE_PROVIDER_SITE_OTHER): Payer: Medicare Other

## 2021-08-15 DIAGNOSIS — E785 Hyperlipidemia, unspecified: Secondary | ICD-10-CM

## 2021-08-15 DIAGNOSIS — R1032 Left lower quadrant pain: Secondary | ICD-10-CM

## 2021-08-15 DIAGNOSIS — R5383 Other fatigue: Secondary | ICD-10-CM

## 2021-08-15 LAB — CBC WITH DIFFERENTIAL/PLATELET
Basophils Absolute: 0 10*3/uL (ref 0.0–0.1)
Basophils Relative: 0.8 % (ref 0.0–3.0)
Eosinophils Absolute: 0.1 10*3/uL (ref 0.0–0.7)
Eosinophils Relative: 0.9 % (ref 0.0–5.0)
HCT: 41 % (ref 36.0–46.0)
Hemoglobin: 13.7 g/dL (ref 12.0–15.0)
Lymphocytes Relative: 46.2 % — ABNORMAL HIGH (ref 12.0–46.0)
Lymphs Abs: 2.8 10*3/uL (ref 0.7–4.0)
MCHC: 33.4 g/dL (ref 30.0–36.0)
MCV: 89.8 fl (ref 78.0–100.0)
Monocytes Absolute: 0.5 10*3/uL (ref 0.1–1.0)
Monocytes Relative: 8.2 % (ref 3.0–12.0)
Neutro Abs: 2.6 10*3/uL (ref 1.4–7.7)
Neutrophils Relative %: 43.9 % (ref 43.0–77.0)
Platelets: 506 10*3/uL — ABNORMAL HIGH (ref 150.0–400.0)
RBC: 4.57 Mil/uL (ref 3.87–5.11)
RDW: 13.3 % (ref 11.5–15.5)
WBC: 6 10*3/uL (ref 4.0–10.5)

## 2021-08-15 LAB — LIPID PANEL
Cholesterol: 178 mg/dL (ref 0–200)
HDL: 43.9 mg/dL (ref >39.00)
LDL Cholesterol: 115 mg/dL — ABNORMAL HIGH (ref 0–99)
NonHDL: 134.09
Total CHOL/HDL Ratio: 4
Triglycerides: 96 mg/dL (ref 0.0–149.0)
VLDL: 19.2 mg/dL (ref 0.0–40.0)

## 2021-08-15 LAB — TSH: TSH: 2.66 u[IU]/mL (ref 0.35–5.50)

## 2021-08-15 LAB — COMPREHENSIVE METABOLIC PANEL
ALT: 13 U/L (ref 0–35)
AST: 14 U/L (ref 0–37)
Albumin: 4.2 g/dL (ref 3.5–5.2)
Alkaline Phosphatase: 105 U/L (ref 39–117)
BUN: 17 mg/dL (ref 6–23)
CO2: 27 mEq/L (ref 19–32)
Calcium: 9.7 mg/dL (ref 8.4–10.5)
Chloride: 102 mEq/L (ref 96–112)
Creatinine, Ser: 0.8 mg/dL (ref 0.40–1.20)
GFR: 74.9 mL/min (ref >60.00)
Glucose, Bld: 88 mg/dL (ref 70–99)
Potassium: 4.3 mEq/L (ref 3.5–5.1)
Sodium: 140 mEq/L (ref 135–145)
Total Bilirubin: 0.6 mg/dL (ref 0.2–1.2)
Total Protein: 7.2 g/dL (ref 6.0–8.3)

## 2021-08-17 ENCOUNTER — Encounter: Payer: Self-pay | Admitting: Family Medicine

## 2021-08-17 ENCOUNTER — Ambulatory Visit (INDEPENDENT_AMBULATORY_CARE_PROVIDER_SITE_OTHER): Payer: Medicare Other | Admitting: Family Medicine

## 2021-08-17 VITALS — BP 109/69 | HR 72 | Temp 98.1°F | Ht 59.25 in | Wt 140.4 lb

## 2021-08-17 DIAGNOSIS — M7918 Myalgia, other site: Secondary | ICD-10-CM

## 2021-08-17 DIAGNOSIS — K5909 Other constipation: Secondary | ICD-10-CM | POA: Diagnosis not present

## 2021-08-17 DIAGNOSIS — D75839 Thrombocytosis, unspecified: Secondary | ICD-10-CM | POA: Diagnosis not present

## 2021-08-17 DIAGNOSIS — K5900 Constipation, unspecified: Secondary | ICD-10-CM

## 2021-08-17 MED ORDER — CYCLOBENZAPRINE HCL 10 MG PO TABS: ORAL_TABLET | ORAL | 5 refills | Status: DC

## 2021-08-17 NOTE — Progress Notes (Signed)
OFFICE VISIT  08/17/2021  CC:  Chief Complaint  Patient presents with   Abdominal Pain    Stomach does not hurt; bowels are still hurting. Stop using so much of the Miralax. Initially told to use every 3 hours but has reduced to 4 times a day.     Patient is a 71 y.o. female who presents for 1 wk f/u constipation and abdominal pain. A/P as of last visit: "abdominal pain.  Chronic constipation which is acutely worse. Soft signs of UTI, trace abnormality on UA. At this point only to try to get her bowels moving better with taking MiraLAX 1 capful 3 times daily.  Check KUB today. Additionally, will treat with Bactrim double strength 1 twice daily x3 days. If not significantly improving over the next week or if worsening prior to the follow-up in 1 week have to consider acute diverticulitis. Checking CBC and c-Met today Urine sent for culture and sensitivities."  INTERIM HX: She is feeling much much better. No more abdominal pain at all.  She has taken quite a bit of MiraLAX and now her stools are loose. No new concerns. Labs all normal last visit except plts mildly elev at 506K.   KUB 08/09/2021: Moderate to large amount of RIGHT colonic stool. No evidence of small bowel obstruction.  She has a long history of osteoarthritic pain, was prescribed Flexeril somewhere along the line and this had helped her left leg pain when she had bad osteoarthritis pain before knee surgery. She has had some of this in the house and taking it lately for diffuse neck and shoulder stiffness and tense feeling.  It has helped significantly.  Past Medical History:  Diagnosis Date   Allergy    Borderline hyperlipidemia    Framingham CV risk 2017= 6%.   Cataract    Cerebral palsy ([place])    Colon polyp, hyperplastic 2004; 12/2015   Recall 12/2025   Difficulty sleeping    Endometrial adenocarcinoma ([place]) 09/2013   Dr. Denman George: pt got vaginal brachytherapy via rad onc.  No sign of dz recurrence as of 02/2016  Gyn Onc f/u.  Next f/u with them is 6 mo.   Morton's neuroma 2012   Dr. Roseanne Reno did injections in the past (left foot)   Osteoarthritis    LB, HIPs, knees (L knee end stage), hands   Osteopenia    DEXA 10/2017 was -1.5.  10/2019 T score -1.9.  Rpt 2 yrs.   Patellofemoral arthralgia of left knee 12/24/2013   Radiation 12/16/13, 12/22/13, 12/30/13, 01/06/14, 01/13/14   proximal vagina 30 gray   Right hip pain 2021   more radicular, like DDD etiology->[facility], [facility]   Rotator cuff tendonitis 04/21/2013   Seasonal allergic rhinitis     Past Surgical History:  Procedure Laterality Date   COLONOSCOPY  09/2002; 12/2015   2004 hyperplastic; recall sent 2009 but pt apparently didn't respond.  12/2015: one sessile serrated polyp w/out cytologic atypia: recall 10 yrs per GI.   DEXA  08/2007; 11/14/15; 10/2017;10/2019   2009 T score -1.2.  2017 T score -1.6.  2019 T score -1.5. 10/2019 0-1.9.  Rpt 2 yrs   DILATION AND CURETTAGE OF UTERUS     LEG SURGERY Left 01/2014   "cleaned out cartilage"   ROBOTIC ASSISTED TOTAL HYSTERECTOMY WITH BILATERAL SALPINGO OOPHERECTOMY Bilateral 10/05/2013   Procedure: ROBOTIC ASSISTED TOTAL HYSTERECTOMY WITH BILATERAL SALPINGO OOPHORECTOMY WITH LYMPH NODE DISECTION;  Surgeon: Everitt Amber, MD;  Location: [facility];  Service: Gynecology;  Laterality: Bilateral;   TONSILLECTOMY     TOTAL KNEE ARTHROPLASTY Left 12/07/2014   Procedure: TOTAL KNEE ARTHROPLASTY;  Surgeon: Latanya Maudlin, MD;  Location: [facility];  Service: Orthopedics;  Laterality: Left;   TUBAL LIGATION      Outpatient Medications Prior to Visit  Medication Sig Dispense Refill   beta carotene w/minerals (OCUVITE) tablet Take 1 tablet by mouth every morning. Reported on 06/01/2015     CALCIUM PO Take 1 tablet by mouth daily.     cetirizine (ZYRTEC) 10 MG tablet Take 10 mg by mouth daily as needed for allergies. Reported on 06/01/2015     Cholecalciferol (VITAMIN D PO) Take 1 tablet by mouth daily.     CRANBERRY PO Take  4,200 mg by mouth. Take 2 capsules daily.     diclofenac Sodium (VOLTAREN) 1 % GEL Apply 2 g topically 4 (four) times daily. 100 g 3   docusate sodium (COLACE) 100 MG capsule Take 100 mg by mouth daily as needed for mild constipation. Reported on 06/01/2015     fluticasone (FLONASE) 50 MCG/ACT nasal spray Place 2 sprays into both nostrils daily. 16 g 6   meloxicam (MOBIC) 15 MG tablet TAKE ONE TABLET BY MOUTH EVERY DAY 90 tablet 1   pantoprazole (PROTONIX) 40 MG tablet Take 1 tablet (40 mg total) by mouth daily. 90 tablet 1   polyethylene glycol (MIRALAX / GLYCOLAX) packet Take 17 g by mouth daily as needed for mild constipation. Reported on 06/01/2015     Probiotic Product (PROBIOTIC PO) Take 1 tablet by mouth every morning. Reported on 06/01/2015     vitamin B-12 (CYANOCOBALAMIN) 1000 MCG tablet Take 1,000 mcg by mouth daily.     cyclobenzaprine (FLEXERIL) 10 MG tablet 1 tab po bid prn for musculoskeletal pain 30 tablet 5   sulfamethoxazole-trimethoprim (BACTRIM DS) 800-160 MG tablet Take 1 tablet by mouth 2 (two) times daily. 6 tablet 0   No facility-administered medications prior to visit.    Allergies  Allergen Reactions   Aspirin Nausea Only    High dose only   Seasonal Ic [Cholestatin]     ROS As per HPI  PE:    08/17/2021    1:50 PM 08/09/2021   10:35 AM 02/22/2021   11:32 AM  Vitals with BMI  Height 4' 11.25" 4' 11.25" _0   Weight 140 lbs 6 oz 140 lbs 3 oz 143 lbs  BMI 28.12 24.46 28.63  Systolic 817 711 657  Diastolic 69 79 77  Pulse 72 65 63     Physical Exam  Gen: Alert, well appearing.  Patient is oriented to person, place, time, and situation. AFFECT: pleasant, lucid thought and speech. No further exam today.  LABS:  Last CBC Lab Results  Component Value Date   WBC 6.0 08/15/2021   HGB 13.7 08/15/2021   HCT 41.0 08/15/2021   MCV 89.8 08/15/2021   MCH 30.3 10/09/2020   RDW 13.3 08/15/2021   PLT 506.0 (H) 90/38/3338   Last metabolic panel Lab Results   Component Value Date   GLUCOSE 88 08/15/2021   NA 140 08/15/2021   K 4.3 08/15/2021   CL 102 08/15/2021   CO2 27 08/15/2021   BUN 17 08/15/2021   CREATININE 0.80 08/15/2021   GFRNONAA >60 12/09/2014   CALCIUM 9.7 08/15/2021   PHOS 2.9 07/29/2012   PROT 7.2 08/15/2021   ALBUMIN 4.2 08/15/2021   BILITOT 0.6 08/15/2021   ALKPHOS 105 08/15/2021   AST 14 08/15/2021  ALT 13 08/15/2021   ANIONGAP 7 12/09/2014   IMPRESSION AND PLAN:  #1 acute on chronic constipation. Essentially resolved.  She will stop MiraLAX and should see stools normalize soon. Continue Colace.  #2 myofascial pain/muscle tension. Flexeril has been helpful for her and she takes this only when she is not going to be up and around out of her home. Prescribed Flexeril 10 mg, one two times daily as needed, #30, refill x5  #3 thrombocytosis.  Suspect reactive.  Repeat CBC today.  An After Visit Summary was printed and given to the patient.  FOLLOW UP: Return for as needed.  Signed:  Crissie Sickles, MD           08/17/2021

## 2021-08-23 LAB — CBC WITH DIFFERENTIAL/PLATELET
Absolute Monocytes: 448 cells/uL (ref 200–950)
Basophils Absolute: 41 cells/uL (ref 0–200)
Basophils Relative: 0.7 %
Eosinophils Absolute: 59 cells/uL (ref 15–500)
Eosinophils Relative: 1 %
HCT: 40.2 % (ref 35.0–45.0)
Hemoglobin: 13.7 g/dL (ref 11.7–15.5)
Lymphs Abs: 2413 cells/uL (ref 850–3900)
MCH: 30.3 pg (ref 27.0–33.0)
MCHC: 34.1 g/dL (ref 32.0–36.0)
MCV: 88.9 fL (ref 80.0–100.0)
MPV: 9.4 fL (ref 7.5–12.5)
Monocytes Relative: 7.6 %
Neutro Abs: 2938 cells/uL (ref 1500–7800)
Neutrophils Relative %: 49.8 %
Platelets: 493 10*3/uL — ABNORMAL HIGH (ref 140–400)
RBC: 4.52 10*6/uL (ref 3.80–5.10)
RDW: 12.2 % (ref 11.0–15.0)
Total Lymphocyte: 40.9 %
WBC: 5.9 10*3/uL (ref 3.8–10.8)

## 2021-08-23 LAB — PATHOLOGIST SMEAR REVIEW

## 2021-09-05 DIAGNOSIS — H353131 Nonexudative age-related macular degeneration, bilateral, early dry stage: Secondary | ICD-10-CM | POA: Diagnosis not present

## 2021-09-05 DIAGNOSIS — H2513 Age-related nuclear cataract, bilateral: Secondary | ICD-10-CM | POA: Diagnosis not present

## 2021-09-26 ENCOUNTER — Ambulatory Visit: Payer: Medicare Other

## 2021-10-03 ENCOUNTER — Ambulatory Visit (INDEPENDENT_AMBULATORY_CARE_PROVIDER_SITE_OTHER): Payer: Medicare Other

## 2021-10-03 DIAGNOSIS — Z0000 Encounter for general adult medical examination without abnormal findings: Secondary | ICD-10-CM | POA: Diagnosis not present

## 2021-10-03 NOTE — Progress Notes (Signed)
Virtual Visit via Telephone Note  I connected with  Kelsey Bruce on 10/03/21 at  9:30 AM EDT by telephone and verified that I am speaking with the correct person using two identifiers.  Medicare Annual Wellness visit completed telephonically due to Covid-19 pandemic.   Persons participating in this call: This Health Coach and this patient.   Location: Patient: home Provider: office   I discussed the limitations, risks, security and privacy concerns of performing an evaluation and management service by telephone and the availability of in person appointments. The patient expressed understanding and agreed to proceed.  Unable to perform video visit due to video visit attempted and failed and/or patient does not have video capability.   Some vital signs may be absent or patient reported.   Willette Brace, LPN   Subjective:   Kelsey Bruce is a 71 y.o. female who presents for Medicare Annual (Subsequent) preventive examination.  Review of Systems     Cardiac Risk Factors include: advanced age (>43mn, >>38women)     Objective:    There were no vitals filed for this visit. There is no height or weight on file to calculate BMI.     10/03/2021    9:23 AM 09/20/2020   11:45 AM 11/16/2018   11:21 AM 05/18/2018   10:07 AM 11/13/2017    8:35 AM 08/14/2017   11:48 AM 08/08/2016   10:41 AM  Advanced Directives  Does Patient Have a Medical Advance Directive? Yes No No No No No No  Does patient want to make changes to medical advance directive? Yes (MAU/Ambulatory/Procedural Areas - Information given)        Would patient like information on creating a medical advance directive?  No - Patient declined         Current Medications (verified) Outpatient Encounter Medications as of 10/03/2021  Medication Sig   beta carotene w/minerals (OCUVITE) tablet Take 1 tablet by mouth every morning. Reported on 06/01/2015   CALCIUM PO Take 1 tablet by mouth daily.   cetirizine  (ZYRTEC) 10 MG tablet Take 10 mg by mouth daily as needed for allergies. Reported on 06/01/2015   Cholecalciferol (VITAMIN D PO) Take 1 tablet by mouth daily.   CRANBERRY PO Take 4,200 mg by mouth. Take 2 capsules daily.   cyclobenzaprine (FLEXERIL) 10 MG tablet 1 tab po bid prn for musculoskeletal pain   diclofenac Sodium (VOLTAREN) 1 % GEL Apply 2 g topically 4 (four) times daily.   docusate sodium (COLACE) 100 MG capsule Take 100 mg by mouth daily as needed for mild constipation. Reported on 06/01/2015   fluticasone (FLONASE) 50 MCG/ACT nasal spray Place 2 sprays into both nostrils daily.   meloxicam (MOBIC) 15 MG tablet TAKE ONE TABLET BY MOUTH EVERY DAY   pantoprazole (PROTONIX) 40 MG tablet Take 1 tablet (40 mg total) by mouth daily.   polyethylene glycol (MIRALAX / GLYCOLAX) packet Take 17 g by mouth daily as needed for mild constipation. Reported on 06/01/2015   Probiotic Product (PROBIOTIC PO) Take 1 tablet by mouth every morning. Reported on 06/01/2015   vitamin B-12 (CYANOCOBALAMIN) 1000 MCG tablet Take 1,000 mcg by mouth daily.   No facility-administered encounter medications on file as of 10/03/2021.    Allergies (verified) Aspirin and Seasonal ic [cholestatin]   History: Past Medical History:  Diagnosis Date   Allergy    Borderline hyperlipidemia    [place] CV risk 2017= 6%.   Cataract    Cerebral palsy ([facility]  Colon polyp, hyperplastic 2004; 12/2015   Recall 12/2025   Difficulty sleeping    Endometrial adenocarcinoma ([facility]) 09/2013   Dr. Denman George: pt got vaginal brachytherapy via rad onc.  No sign of dz recurrence as of 02/2016 Gyn Onc f/u.  Next f/u with them is 6 mo.   Morton's neuroma 2012   Dr. Roseanne Reno did injections in the past (left foot)   Osteoarthritis    LB, HIPs, knees (L knee end stage), hands   Osteopenia    DEXA 10/2017 was -1.5.  10/2019 T score -1.9.  Rpt 2 yrs.   Patellofemoral arthralgia of left knee 12/24/2013   Radiation 12/16/13, 12/22/13, 12/30/13,  01/06/14, 01/13/14   proximal vagina 30 gray   Right hip pain 2021   more radicular, like DDD etiology->PT, [facility]   Rotator cuff tendonitis 04/21/2013   Seasonal allergic rhinitis    Past Surgical History:  Procedure Laterality Date   COLONOSCOPY  09/2002; 12/2015   2004 hyperplastic; recall sent 2009 but pt apparently didn't respond.  12/2015: one sessile serrated polyp w/out cytologic atypia: recall 10 yrs per GI.   DEXA  08/2007; 11/14/15; 10/2017;10/2019   2009 T score -1.2.  2017 T score -1.6.  2019 T score -1.5. 10/2019 0-1.9.  Rpt 2 yrs   DILATION AND CURETTAGE OF UTERUS     LEG SURGERY Left 01/2014   "cleaned out cartilage"   ROBOTIC ASSISTED TOTAL HYSTERECTOMY WITH BILATERAL SALPINGO OOPHERECTOMY Bilateral 10/05/2013   Procedure: ROBOTIC ASSISTED TOTAL HYSTERECTOMY WITH BILATERAL SALPINGO OOPHORECTOMY WITH LYMPH NODE DISECTION;  Surgeon: Everitt Amber, MD;  Location: [facility];  Service: Gynecology;  Laterality: Bilateral;   TONSILLECTOMY     TOTAL KNEE ARTHROPLASTY Left 12/07/2014   Procedure: TOTAL KNEE ARTHROPLASTY;  Surgeon: Latanya Maudlin, MD;  Location: [facility];  Service: Orthopedics;  Laterality: Left;   TUBAL LIGATION     Family History  Problem Relation Age of Onset   Heart disease Father    Hypertension Father    Cancer Father        lung   Arthritis Father    Alcohol abuse Father    Heart disease Mother    Cancer Mother        lung   Deep vein thrombosis Mother    Alcohol abuse Mother    Heart disease Brother    Hypertension Brother    Heart disease Brother    Hypertension Brother    Heart disease Brother    Hypertension Brother    Stomach cancer Maternal Grandfather    Colon cancer Neg Hx    Esophageal cancer Neg Hx    Rectal cancer Neg Hx    Social History   Socioeconomic History   Marital status: Divorced    Spouse name: Not on file   Number of children: 2   Years of education: Not on file   Highest education level: 12th grade  Occupational History    Occupation: retired  Tobacco Use   Smoking status: Never   Smokeless tobacco: Never  Vaping Use   Vaping Use: Never used  Substance and Sexual Activity   Alcohol use: No    Comment: occasional   Drug use: No   Sexual activity: Not on file  Other Topics Concern   Not on file  Social History Narrative   Divorced.  Two children.   Orig from [place], [place].   HS education.   Retired.   No tobacco, occ alcohol, no drugs.   Has 3  brothers and they all smoked, as did her parents.   Social Determinants of Health   Financial Resource Strain: Low Risk  (10/03/2021)   Overall Financial Resource Strain (CARDIA)    Difficulty of Paying Living Expenses: Not hard at all  Food Insecurity: No Food Insecurity (10/03/2021)   Hunger Vital Sign    Worried About Running Out of Food in the Last Year: Never true    Ran Out of Food in the Last Year: Never true  Transportation Needs: No Transportation Needs (10/03/2021)   PRAPARE - Hydrologist (Medical): No    Lack of Transportation (Non-Medical): No  Physical Activity: Insufficiently Active (10/03/2021)   Exercise Vital Sign    Days of Exercise per Week: 2 days    Minutes of Exercise per Session: 30 min  Stress: No Stress Concern Present (10/03/2021)   [place]    Feeling of Stress : Not at all  Social Connections: Moderately Isolated (10/03/2021)   Social Connection and Isolation Panel [NHANES]    Frequency of Communication with Friends and Family: More than three times a week    Frequency of Social Gatherings with Friends and Family: More than three times a week    Attends Religious Services: More than 4 times per year    Active Member of [employer] or Organizations: No    Attends Music therapist: Never    Marital Status: Divorced    Tobacco Counseling Counseling given: Not Answered   Clinical Intake:  Pre-visit preparation  completed: Yes  Pain : No/denies pain     BMI - recorded: 28.12 Nutritional Status: BMI 25 -29 Overweight Nutritional Risks: None Diabetes: No  How often do you need to have someone help you when you read instructions, pamphlets, or other written materials from your doctor or pharmacy?: 1 - Never  Diabetic?no  Interpreter Needed?: No  Information entered by :: Charlott Rakes, LPN   Activities of Daily Living    10/03/2021    9:25 AM  In your present state of health, do you have any difficulty performing the following activities:  Hearing? 0  Vision? 0  Difficulty concentrating or making decisions? 0  Walking or climbing stairs? 1  Dressing or bathing? 0  Comment have to take time  Doing errands, shopping? 0  Preparing Food and eating ? N  Using the Toilet? N  In the past six months, have you accidently leaked urine? Y  Comment wears a pad related to leak at times  Do you have problems with loss of bowel control? N  Managing your Medications? N  Managing your Finances? N  Housekeeping or managing your Housekeeping? N    Patient Care Team: Tammi Sou, MD as PCP - General (Family Medicine) Irene Shipper, MD as Consulting Physician (Gastroenterology) Emily Filbert, MD as Consulting Physician (Obstetrics and Gynecology) Everitt Amber, MD as Consulting Physician (Obstetrics and Gynecology) Latanya Maudlin, MD as Consulting Physician (Orthopedic Surgery) Gean Birchwood, DPM (Inactive) as Consulting Physician (Podiatry)  Indicate any recent Medical Services you may have received from other than [facility] providers in the past year (date may be approximate).     Assessment:   This is a routine wellness examination for Kelsey Bruce.  Hearing/Vision screen Hearing Screening - Comments:: Pt denies any hearing issues  Vision Screening - Comments:: Pt follows up with [facility] for annual eye exams   Dietary issues and exercise activities discussed:  Current Exercise  Habits: Home exercise routine, Type of exercise: walking, Time (Minutes): 30, Frequency (Times/Week): 2, Weekly Exercise (Minutes/Week): 60   Goals Addressed   None    Depression Screen    10/03/2021    9:22 AM 08/09/2021   11:01 AM 02/09/2021    9:40 AM 08/11/2020    8:26 AM 07/15/2019    8:59 AM 07/21/2017    9:16 AM 05/08/2017   10:42 AM  PHQ 2/9 Scores  PHQ - 2 Score 0 0 0 0 1 0 0    Fall Risk    10/03/2021    9:24 AM 08/09/2021   11:01 AM 02/09/2021    9:39 AM 02/08/2021    4:43 PM 09/20/2020   11:46 AM  Fall Risk   Falls in the past year? '1 1 1 1 1  '$ Number falls in past yr: '1 1 1 1 1  '$ Injury with Fall? 0 0 0 0 0  Risk for fall due to : Impaired vision;Impaired balance/gait Impaired balance/gait;Impaired vision     Follow up Falls prevention discussed Falls evaluation completed Falls evaluation completed  Falls evaluation completed;Falls prevention discussed    FALL RISK PREVENTION PERTAINING TO THE HOME:  Any stairs in or around the home? Yes  If so, are there any without handrails? No  Home free of loose throw rugs in walkways, pet beds, electrical cords, etc? Yes  Adequate lighting in your home to reduce risk of falls? Yes   ASSISTIVE DEVICES UTILIZED TO PREVENT FALLS:  Life alert? No  Use of a cane, walker or w/c? No  Grab bars in the bathroom? No  Shower chair or bench in shower? Yes  Elevated toilet seat or a handicapped toilet? No   TIMED UP AND GO:  Was the test performed? No .   Cognitive Function:        10/03/2021    9:26 AM  6CIT Screen  What Year? 0 points  What month? 0 points  What time? 0 points  Count back from 20 0 points  Months in reverse 0 points  Repeat phrase 0 points  Total Score 0 points    Immunizations Immunization History  Administered Date(s) Administered   Fluad Quad(high Dose 65+) 01/20/2020, 02/09/2021   Influenza, High Dose Seasonal PF 12/23/2017, 01/12/2019   PFIZER(Purple Top)SARS-COV-2 Vaccination 06/02/2019,  06/30/2019, 04/06/2020   PNEUMOCOCCAL CONJUGATE-20 08/11/2020   Pneumococcal Polysaccharide-23 07/15/2019   Tdap 09/30/2014    TDAP status: Up to date  Flu Vaccine status: Up to date  Pneumococcal vaccine status: Up to date  Covid-19 vaccine status: Completed vaccines  Qualifies for Shingles Vaccine? Yes   Zostavax completed No   Shingrix Completed?: No.    Education has been provided regarding the importance of this vaccine. Patient has been advised to call insurance company to determine out of pocket expense if they have not yet received this vaccine. Advised may also receive vaccine at local pharmacy or Health Dept. Verbalized acceptance and understanding.  Screening Tests Health Maintenance  Topic Date Due   Zoster Vaccines- Shingrix (1 of 2) Never done   COVID-19 Vaccine (4 - Booster for Pfizer series) 06/01/2020   COLONOSCOPY (Pts 45-100yr Insurance coverage will need to be confirmed)  01/03/2021   INFLUENZA VACCINE  10/23/2021   MAMMOGRAM  11/28/2021   TETANUS/TDAP  09/29/2024   Pneumonia Vaccine 70 Years old  Completed   DEXA SCAN  Completed   Hepatitis C Screening  Completed   HPV VACCINES  Aged  Out    Health Maintenance  Health Maintenance Due  Topic Date Due   Zoster Vaccines- Shingrix (1 of 2) Never done   COVID-19 Vaccine (4 - Booster for Pfizer series) 06/01/2020   COLONOSCOPY (Pts 45-42yr Insurance coverage will need to be confirmed)  01/03/2021    Colorectal cancer screening: Type of screening: Colonoscopy. Completed 01/04/16. Repeat every 5 years pt stated 10 years before the next colonscopy   Mammogram status: Completed 11/28/20. Repeat every year scheduled for 12/04/21  Bone Density status: Completed 11/22/19. Results reflect: Bone density results: OSTEOPENIA. Repeat every 2 years. Schedlued 12/04/21   Additional Screening:  Hepatitis C Screening:  Completed 05/08/17  Vision Screening: Recommended annual ophthalmology exams for early detection of  glaucoma and other disorders of the eye. Is the patient up to date with their annual eye exam?  Yes  Who is the provider or what is the name of the office in which the patient attends annual eye exams? [facility]  If pt is not established with a provider, would they like to be referred to a provider to establish care? No .   Dental Screening: Recommended annual dental exams for proper oral hygiene  Community Resource Referral / Chronic Care Management: CRR required this visit?  No   CCM required this visit?  No      Plan:     I have personally reviewed and noted the following in the patient's chart:   Medical and social history Use of alcohol, tobacco or illicit drugs  Current medications and supplements including opioid prescriptions.  Functional ability and status Nutritional status Physical activity Advanced directives List of other physicians Hospitalizations, surgeries, and ER visits in previous 12 months Vitals Screenings to include cognitive, depression, and falls Referrals and appointments  In addition, I have reviewed and discussed with patient certain preventive protocols, quality metrics, and best practice recommendations. A written personalized care plan for preventive services as well as general preventive health recommendations were provided to patient.     TWillette Brace LPN   [id number]  Nurse Notes: None

## 2021-10-03 NOTE — Patient Instructions (Addendum)
Kelsey Bruce , Thank you for taking time to come for your Medicare Wellness Visit. I appreciate your ongoing commitment to your health goals. Please review the following plan we discussed and let me know if I can assist you in the future.   Screening recommendations/referrals: Colonoscopy: Done 12/25/15 repeat every 10 years per pt  Mammogram: scheduled 12/04/21 Bone Density: scheduled 12/04/21 Recommended yearly ophthalmology/optometry visit for glaucoma screening and checkup Recommended yearly dental visit for hygiene and checkup  Vaccinations: Influenza vaccine: done 02/09/21 repeat every  year  Pneumococcal vaccine: Up to date Tdap vaccine: Done 09/30/14 repeat every 10 years  Shingles vaccine: Shingrix discussed. Please contact your pharmacy for coverage information.    Covid-19:Completed 3/10, 06/30/19 & 04/05/20  Advanced directives: Please bring a copy of your health care power of attorney and living will to the office at your convenience.  Conditions/risks identified: maintain better balance   Next appointment: Follow up in one year for your annual wellness visit    Preventive Care 65 Years and Older, Female Preventive care refers to lifestyle choices and visits with your health care provider that can promote health and wellness. What does preventive care include? A yearly physical exam. This is also called an annual well check. Dental exams once or twice a year. Routine eye exams. Ask your health care provider how often you should have your eyes checked. Personal lifestyle choices, including: Daily care of your teeth and gums. Regular physical activity. Eating a healthy diet. Avoiding tobacco and drug use. Limiting alcohol use. Practicing safe sex. Taking low-dose aspirin every day. Taking vitamin and mineral supplements as recommended by your health care provider. What happens during an annual well check? The services and screenings done by your health care provider during  your annual well check will depend on your age, overall health, lifestyle risk factors, and family history of disease. Counseling  Your health care provider may ask you questions about your: Alcohol use. Tobacco use. Drug use. Emotional well-being. Home and relationship well-being. Sexual activity. Eating habits. History of falls. Memory and ability to understand (cognition). Work and work Statistician. Reproductive health. Screening  You may have the following tests or measurements: Height, weight, and BMI. Blood pressure. Lipid and cholesterol levels. These may be checked every 5 years, or more frequently if you are over 42 years old. Skin check. Lung cancer screening. You may have this screening every year starting at age 84 if you have a 30-pack-year history of smoking and currently smoke or have quit within the past 15 years. Fecal occult blood test (FOBT) of the stool. You may have this test every year starting at age 51. Flexible sigmoidoscopy or colonoscopy. You may have a sigmoidoscopy every 5 years or a colonoscopy every 10 years starting at age 23. Hepatitis C blood test. Hepatitis B blood test. Sexually transmitted disease (STD) testing. Diabetes screening. This is done by checking your blood sugar (glucose) after you have not eaten for a while (fasting). You may have this done every 1-3 years. Bone density scan. This is done to screen for osteoporosis. You may have this done starting at age 5. Mammogram. This may be done every 1-2 years. Talk to your health care provider about how often you should have regular mammograms. Talk with your health care provider about your test results, treatment options, and if necessary, the need for more tests. Vaccines  Your health care provider may recommend certain vaccines, such as: Influenza vaccine. This is recommended every year. Tetanus, diphtheria,  and acellular pertussis (Tdap, Td) vaccine. You may need a Td booster every 10  years. Zoster vaccine. You may need this after age 46. Pneumococcal 13-valent conjugate (PCV13) vaccine. One dose is recommended after age 28. Pneumococcal polysaccharide (PPSV23) vaccine. One dose is recommended after age 55. Talk to your health care provider about which screenings and vaccines you need and how often you need them. This information is not intended to replace advice given to you by your health care provider. Make sure you discuss any questions you have with your health care provider. Document Released: 04/07/2015 Document Revised: 11/29/2015 Document Reviewed: 01/10/2015 Elsevier Interactive Patient Education  2017 [place] Prevention in the Home Falls can cause injuries. They can happen to people of all ages. There are many things you can do to make your home safe and to help prevent falls. What can I do on the outside of my home? Regularly fix the edges of walkways and driveways and fix any cracks. Remove anything that might make you trip as you walk through a door, such as a raised step or threshold. Trim any bushes or trees on the path to your home. Use bright outdoor lighting. Clear any walking paths of anything that might make someone trip, such as rocks or tools. Regularly check to see if handrails are loose or broken. Make sure that both sides of any steps have handrails. Any raised decks and porches should have guardrails on the edges. Have any leaves, snow, or ice cleared regularly. Use sand or salt on walking paths during winter. Clean up any spills in your garage right away. This includes oil or grease spills. What can I do in the bathroom? Use night lights. Install grab bars by the toilet and in the tub and shower. Do not use towel bars as grab bars. Use non-skid mats or decals in the tub or shower. If you need to sit down in the shower, use a plastic, non-slip stool. Keep the floor dry. Clean up any water that spills on the floor as soon as it  happens. Remove soap buildup in the tub or shower regularly. Attach bath mats securely with double-sided non-slip rug tape. Do not have throw rugs and other things on the floor that can make you trip. What can I do in the bedroom? Use night lights. Make sure that you have a light by your bed that is easy to reach. Do not use any sheets or blankets that are too big for your bed. They should not hang down onto the floor. Have a firm chair that has side arms. You can use this for support while you get dressed. Do not have throw rugs and other things on the floor that can make you trip. What can I do in the kitchen? Clean up any spills right away. Avoid walking on wet floors. Keep items that you use a lot in easy-to-reach places. If you need to reach something above you, use a strong step stool that has a grab bar. Keep electrical cords out of the way. Do not use floor polish or wax that makes floors slippery. If you must use wax, use non-skid floor wax. Do not have throw rugs and other things on the floor that can make you trip. What can I do with my stairs? Do not leave any items on the stairs. Make sure that there are handrails on both sides of the stairs and use them. Fix handrails that are broken or loose. Make sure  that handrails are as long as the stairways. Check any carpeting to make sure that it is firmly attached to the stairs. Fix any carpet that is loose or worn. Avoid having throw rugs at the top or bottom of the stairs. If you do have throw rugs, attach them to the floor with carpet tape. Make sure that you have a light switch at the top of the stairs and the bottom of the stairs. If you do not have them, ask someone to add them for you. What else can I do to help prevent falls? Wear shoes that: Do not have high heels. Have rubber bottoms. Are comfortable and fit you well. Are closed at the toe. Do not wear sandals. If you use a stepladder: Make sure that it is fully opened.  Do not climb a closed stepladder. Make sure that both sides of the stepladder are locked into place. Ask someone to hold it for you, if possible. Clearly mark and make sure that you can see: Any grab bars or handrails. First and last steps. Where the edge of each step is. Use tools that help you move around (mobility aids) if they are needed. These include: Canes. Walkers. Scooters. Crutches. Turn on the lights when you go into a dark area. Replace any light bulbs as soon as they burn out. Set up your furniture so you have a clear path. Avoid moving your furniture around. If any of your floors are uneven, fix them. If there are any pets around you, be aware of where they are. Review your medicines with your doctor. Some medicines can make you feel dizzy. This can increase your chance of falling. Ask your doctor what other things that you can do to help prevent falls. This information is not intended to replace advice given to you by your health care provider. Make sure you discuss any questions you have with your health care provider. Document Released: 01/05/2009 Document Revised: 08/17/2015 Document Reviewed: 04/15/2014 Elsevier Interactive Patient Education  2017 [employer].

## 2021-10-12 ENCOUNTER — Ambulatory Visit (INDEPENDENT_AMBULATORY_CARE_PROVIDER_SITE_OTHER): Payer: Medicare Other | Admitting: Family Medicine

## 2021-10-12 ENCOUNTER — Encounter: Payer: Self-pay | Admitting: Family Medicine

## 2021-10-12 VITALS — BP 117/74 | HR 82 | Temp 98.0°F | Ht 59.25 in | Wt 141.0 lb

## 2021-10-12 DIAGNOSIS — N3000 Acute cystitis without hematuria: Secondary | ICD-10-CM

## 2021-10-12 DIAGNOSIS — R300 Dysuria: Secondary | ICD-10-CM | POA: Diagnosis not present

## 2021-10-12 DIAGNOSIS — R3915 Urgency of urination: Secondary | ICD-10-CM

## 2021-10-12 LAB — POCT URINALYSIS DIPSTICK
Bilirubin, UA: NEGATIVE
Blood, UA: NEGATIVE
Glucose, UA: NEGATIVE
Ketones, UA: NEGATIVE
Nitrite, UA: NEGATIVE
Protein, UA: POSITIVE — AB
Spec Grav, UA: 1.015 (ref 1.010–1.025)
Urobilinogen, UA: 0.2 E.U./dL
pH, UA: 6 (ref 5.0–8.0)

## 2021-10-12 MED ORDER — SULFAMETHOXAZOLE-TRIMETHOPRIM 800-160 MG PO TABS: 1.0000 | ORAL_TABLET | Freq: Two times a day (BID) | ORAL | 0 refills | Status: DC

## 2021-10-12 NOTE — Progress Notes (Signed)
OFFICE VISIT  10/12/2021  CC:  Chief Complaint  Patient presents with   Urinary concerns    Pt c/o urgency and burning with urination. Took 2 cranberry pills this morning.    Patient is a 71 y.o. female who presents for urinary symptoms.  HPI: 2 days of urinary urgency, frequency, and dysuria. No abdominal or flank pain.  No hematuria. No nausea or malaise.  No vaginal discharge or itching Took some cranberry juice this morning but no other meds.   Past Medical History:  Diagnosis Date   Allergy    Borderline hyperlipidemia    F[place]ingham CV risk 2017= 6%.   Cataract    Cerebral palsy ([place])    Colon polyp, hyperplastic 2004; 12/2015   Recall 12/2025   Difficulty sleeping    Endometrial adenocarcinoma ([place]) 09/2013   Dr. Denman George: pt got vaginal brachytherapy via rad onc.  No sign of dz recurrence as of 02/2016 Gyn Onc f/u.  Next f/u with them is 6 mo.   Morton's neuroma 2012   Dr. Roseanne Reno did injections in the past (left foot)   Osteoarthritis    LB, HIPs, knees (L knee end stage), hands   Osteopenia    DEXA 10/2017 was -1.5.  10/2019 T score -1.9.  Rpt 2 yrs.   Patellofemoral arthralgia of left knee 12/24/2013   Radiation 12/16/13, 12/22/13, 12/30/13, 01/06/14, 01/13/14   proximal vagina 30 gray   Right hip pain 2021   more radicular, like DDD etiology->PT, [facility]   Rotator cuff tendonitis 04/21/2013   Seasonal allergic rhinitis     Past Surgical History:  Procedure Laterality Date   COLONOSCOPY  09/2002; 12/2015   2004 hyperplastic; recall sent 2009 but pt apparently didn't respond.  12/2015: one sessile serrated polyp w/out cytologic atypia: recall 10 yrs per GI.   DEXA  08/2007; 11/14/15; 10/2017;10/2019   2009 T score -1.2.  2017 T score -1.6.  2019 T score -1.5. 10/2019 0-1.9.  Rpt 2 yrs   DILATION AND CURETTAGE OF UTERUS     LEG SURGERY Left 01/2014   "cleaned out cartilage"   ROBOTIC ASSISTED TOTAL HYSTERECTOMY WITH BILATERAL SALPINGO OOPHERECTOMY Bilateral 10/05/2013    Procedure: ROBOTIC ASSISTED TOTAL HYSTERECTOMY WITH BILATERAL SALPINGO OOPHORECTOMY WITH LYMPH NODE DISECTION;  Surgeon: Everitt Amber, MD;  Location: [facility];  Service: Gynecology;  Laterality: Bilateral;   TONSILLECTOMY     TOTAL KNEE ARTHROPLASTY Left 12/07/2014   Procedure: TOTAL KNEE ARTHROPLASTY;  Surgeon: Latanya Maudlin, MD;  Location: [facility];  Service: Orthopedics;  Laterality: Left;   TUBAL LIGATION      Outpatient Medications Prior to Visit  Medication Sig Dispense Refill   beta carotene w/minerals (OCUVITE) tablet Take 1 tablet by mouth every morning. Reported on 06/01/2015     CALCIUM PO Take 1 tablet by mouth daily.     cetirizine (ZYRTEC) 10 MG tablet Take 10 mg by mouth daily as needed for allergies. Reported on 06/01/2015     Cholecalciferol (VITAMIN D PO) Take 1 tablet by mouth daily.     CRANBERRY PO Take 4,200 mg by mouth. Take 2 capsules daily.     cyclobenzaprine (FLEXERIL) 10 MG tablet 1 tab po bid prn for musculoskeletal pain 30 tablet 5   diclofenac Sodium (VOLTAREN) 1 % GEL Apply 2 g topically 4 (four) times daily. 100 g 3   docusate sodium (COLACE) 100 MG capsule Take 100 mg by mouth daily as needed for mild constipation. Reported on 06/01/2015  fluticasone (FLONASE) 50 MCG/ACT nasal spray Place 2 sprays into both nostrils daily. 16 g 6   meloxicam (MOBIC) 15 MG tablet TAKE ONE TABLET BY MOUTH EVERY DAY 90 tablet 1   pantoprazole (PROTONIX) 40 MG tablet Take 1 tablet (40 mg total) by mouth daily. 90 tablet 1   polyethylene glycol (MIRALAX / GLYCOLAX) packet Take 17 g by mouth daily as needed for mild constipation. Reported on 06/01/2015     Probiotic Product (PROBIOTIC PO) Take 1 tablet by mouth every morning. Reported on 06/01/2015     vitamin B-12 (CYANOCOBALAMIN) 1000 MCG tablet Take 1,000 mcg by mouth daily.     No facility-administered medications prior to visit.    Allergies  Allergen Reactions   Aspirin Nausea Only    High dose only   Seasonal Ic [Cholestatin]      ROS As per HPI  PE:    10/12/2021    4:16 PM 08/17/2021    1:50 PM 08/09/2021   10:35 AM  Vitals with BMI  Height 4' 11.25" 4' 11.25" 4' 11.25"  Weight 141 lbs 140 lbs 6 oz 140 lbs 3 oz  BMI 28.24 70.17 79.39  Systolic 030 092 330  Diastolic 74 69 79  Pulse 82 72 65     Physical Exam  Gen: Alert, well appearing.  Patient is oriented to person, place, time, and situation. AFFECT: pleasant, lucid thought and speech. No further exam today  LABS:  Last CBC Lab Results  Component Value Date   WBC 5.9 08/17/2021   HGB 13.7 08/17/2021   HCT 40.2 08/17/2021   MCV 88.9 08/17/2021   MCH 30.3 08/17/2021   RDW 12.2 08/17/2021   PLT 493 (H) 07/62/2633   Last metabolic panel Lab Results  Component Value Date   GLUCOSE 88 08/15/2021   NA 140 08/15/2021   K 4.3 08/15/2021   CL 102 08/15/2021   CO2 27 08/15/2021   BUN 17 08/15/2021   CREATININE 0.80 08/15/2021   GFRNONAA >60 12/09/2014   CALCIUM 9.7 08/15/2021   PHOS 2.9 07/29/2012   PROT 7.2 08/15/2021   ALBUMIN 4.2 08/15/2021   BILITOT 0.6 08/15/2021   ALKPHOS 105 08/15/2021   AST 14 08/15/2021   ALT 13 08/15/2021   ANIONGAP 7 12/09/2014   POC CC UA today 2+ LEU, +protein, o/w normal.  IMPRESSION AND PLAN:  Acute cystitis. Bactrim double strength, 1 twice daily x3 days.   Urine specimen sent for culture and sensitivities.  An After Visit Summary was printed and given to the patient.  FOLLOW UP: No follow-ups on file.  Signed:  Crissie Sickles, MD           10/12/2021

## 2021-10-14 LAB — URINE CULTURE
MICRO NUMBER:: 13679805
SPECIMEN QUALITY:: ADEQUATE

## 2021-10-15 ENCOUNTER — Telehealth: Payer: Self-pay

## 2021-10-15 NOTE — Telephone Encounter (Signed)
Patient called back regarding results.  Please call aptient when available.

## 2021-10-15 NOTE — Telephone Encounter (Signed)
Spoke with patient regarding results/recommendations.  

## 2021-11-08 ENCOUNTER — Other Ambulatory Visit: Payer: Self-pay | Admitting: Family Medicine

## 2021-12-04 ENCOUNTER — Other Ambulatory Visit (HOSPITAL_BASED_OUTPATIENT_CLINIC_OR_DEPARTMENT_OTHER): Payer: Medicare Other

## 2021-12-04 ENCOUNTER — Ambulatory Visit (HOSPITAL_BASED_OUTPATIENT_CLINIC_OR_DEPARTMENT_OTHER): Payer: Medicare Other

## 2021-12-11 ENCOUNTER — Encounter (HOSPITAL_BASED_OUTPATIENT_CLINIC_OR_DEPARTMENT_OTHER): Payer: Self-pay

## 2021-12-11 ENCOUNTER — Ambulatory Visit (HOSPITAL_BASED_OUTPATIENT_CLINIC_OR_DEPARTMENT_OTHER)
Admission: RE | Admit: 2021-12-11 | Discharge: 2021-12-11 | Disposition: A | Discharge status: Home / Self Care | Payer: Medicare Other | Source: Ambulatory Visit | Attending: Family Medicine | Admitting: Family Medicine

## 2021-12-11 DIAGNOSIS — M8580 Other specified disorders of bone density and structure, unspecified site: Secondary | ICD-10-CM

## 2021-12-11 DIAGNOSIS — E2839 Other primary ovarian failure: Secondary | ICD-10-CM | POA: Diagnosis present

## 2021-12-11 DIAGNOSIS — Z1231 Encounter for screening mammogram for malignant neoplasm of breast: Secondary | ICD-10-CM | POA: Diagnosis not present

## 2021-12-11 DIAGNOSIS — Z780 Asymptomatic menopausal state: Secondary | ICD-10-CM | POA: Diagnosis not present

## 2021-12-11 DIAGNOSIS — M85852 Other specified disorders of bone density and structure, left thigh: Secondary | ICD-10-CM | POA: Diagnosis not present

## 2022-02-11 ENCOUNTER — Other Ambulatory Visit: Payer: Self-pay | Admitting: Family Medicine

## 2022-02-12 ENCOUNTER — Encounter: Payer: Self-pay | Admitting: Family Medicine

## 2022-02-12 ENCOUNTER — Ambulatory Visit (INDEPENDENT_AMBULATORY_CARE_PROVIDER_SITE_OTHER): Payer: Medicare Other | Admitting: Family Medicine

## 2022-02-12 VITALS — BP 132/76 | HR 66 | Temp 97.8°F | Ht 59.25 in | Wt 143.0 lb

## 2022-02-12 DIAGNOSIS — Z791 Long term (current) use of non-steroidal anti-inflammatories (NSAID): Secondary | ICD-10-CM

## 2022-02-12 DIAGNOSIS — D75839 Thrombocytosis, unspecified: Secondary | ICD-10-CM | POA: Diagnosis not present

## 2022-02-12 DIAGNOSIS — M159 Polyosteoarthritis, unspecified: Secondary | ICD-10-CM | POA: Diagnosis not present

## 2022-02-12 LAB — CBC WITH DIFFERENTIAL/PLATELET
Basophils Absolute: 0 10*3/uL (ref 0.0–0.1)
Basophils Relative: 0.8 % (ref 0.0–3.0)
Eosinophils Absolute: 0.1 10*3/uL (ref 0.0–0.7)
Eosinophils Relative: 1.3 % (ref 0.0–5.0)
HCT: 45.6 % (ref 36.0–46.0)
Hemoglobin: 15.2 g/dL — ABNORMAL HIGH (ref 12.0–15.0)
Lymphocytes Relative: 32.1 % (ref 12.0–46.0)
Lymphs Abs: 2 10*3/uL (ref 0.7–4.0)
MCHC: 33.5 g/dL (ref 30.0–36.0)
MCV: 90.1 fl (ref 78.0–100.0)
Monocytes Absolute: 0.6 10*3/uL (ref 0.1–1.0)
Monocytes Relative: 9.7 % (ref 3.0–12.0)
Neutro Abs: 3.5 10*3/uL (ref 1.4–7.7)
Neutrophils Relative %: 56.1 % (ref 43.0–77.0)
Platelets: 300 10*3/uL (ref 150.0–400.0)
RBC: 5.06 Mil/uL (ref 3.87–5.11)
RDW: 13.3 % (ref 11.5–15.5)
WBC: 6.2 10*3/uL (ref 4.0–10.5)

## 2022-02-12 LAB — BASIC METABOLIC PANEL
BUN: 17 mg/dL (ref 6–23)
CO2: 30 mEq/L (ref 19–32)
Calcium: 9.5 mg/dL (ref 8.4–10.5)
Chloride: 102 mEq/L (ref 96–112)
Creatinine, Ser: 0.76 mg/dL (ref 0.40–1.20)
GFR: 79.37 mL/min (ref >60.00)
Glucose, Bld: 89 mg/dL (ref 70–99)
Potassium: 4.7 mEq/L (ref 3.5–5.1)
Sodium: 139 mEq/L (ref 135–145)

## 2022-02-12 MED ORDER — PANTOPRAZOLE SODIUM 40 MG PO TBEC: 40.0000 mg | DELAYED_RELEASE_TABLET | Freq: Every day | ORAL | 1 refills | Status: DC

## 2022-02-12 MED ORDER — DICLOFENAC SODIUM 1 % EX GEL
2.0000 g | Freq: Four times a day (QID) | CUTANEOUS | 3 refills | Status: AC
Start: 2022-02-12 — End: ?

## 2022-02-12 MED ORDER — CYCLOBENZAPRINE HCL 10 MG PO TABS: ORAL_TABLET | ORAL | 5 refills | Status: DC

## 2022-02-12 MED ORDER — MELOXICAM 15 MG PO TABS: 15.0000 mg | ORAL_TABLET | Freq: Every day | ORAL | 1 refills | Status: DC

## 2022-02-12 NOTE — Progress Notes (Signed)
OFFICE VISIT  02/12/2022  CC:  Chief Complaint  Patient presents with   Follow-up    osteoarthritis    Patient is a 71 y.o. female who presents for follow-up osteoarthritis and chronic constipation.  INTERIM HX: Feeling well. Constipation responding to Colace and as needed MiraLAX. Osteoarthritis stable, primarily hands bother her but sometimes knees as well.  Recently her neck has been more of the issue. Takes Mobic almost every day.  Sometimes takes Aleve on days that she wakes up in the morning with significant pain.  Does not take Mobic on these days. Some Tylenol supplement as well.   Past Medical History:  Diagnosis Date   Allergy    Borderline hyperlipidemia    Framingham CV risk 2017= 6%.   Cataract    Cerebral palsy ([place])    Colon polyp, hyperplastic 2004; 12/2015   Recall 12/2025   Difficulty sleeping    Endometrial adenocarcinoma ([place]) 09/2013   Dr. Denman George: pt got vaginal brachytherapy via rad onc.  No sign of dz recurrence as of 02/2016 Gyn Onc f/u.  Next f/u with them is 6 mo.   Morton's neuroma 2012   Dr. Roseanne Reno did injections in the past (left foot)   Osteoarthritis    LB, HIPs, knees (L knee end stage), hands   Osteopenia    DEXA 10/2017 was -1.5.  10/2019 T score -1.9.  11/2021 T score -1.7   Patellofemoral arthralgia of left knee 12/24/2013   Radiation 12/16/13, 12/22/13, 12/30/13, 01/06/14, 01/13/14   proximal vagina 30 gray   Right hip pain 2021   more radicular, like DDD etiology->[facility], [facility]   Rotator cuff tendonitis 04/21/2013   Seasonal allergic rhinitis     Past Surgical History:  Procedure Laterality Date   COLONOSCOPY  09/2002; 12/2015   2004 hyperplastic; recall sent 2009 but pt apparently didn't respond.  12/2015: one sessile serrated polyp w/out cytologic atypia: recall 10 yrs per GI.   DEXA  08/2007; 11/14/15; 10/2017;10/2019   2009 T score -1.2.  2017 T score -1.6.  2019 T score -1.5. 10/2019 0-1.9.  11/2021 T score -1.7.   DILATION AND  CURETTAGE OF UTERUS     LEG SURGERY Left 01/2014   "cleaned out cartilage"   ROBOTIC ASSISTED TOTAL HYSTERECTOMY WITH BILATERAL SALPINGO OOPHERECTOMY Bilateral 10/05/2013   Procedure: ROBOTIC ASSISTED TOTAL HYSTERECTOMY WITH BILATERAL SALPINGO OOPHORECTOMY WITH LYMPH NODE DISECTION;  Surgeon: Everitt Amber, MD;  Location: [facility];  Service: Gynecology;  Laterality: Bilateral;   TONSILLECTOMY     TOTAL KNEE ARTHROPLASTY Left 12/07/2014   Procedure: TOTAL KNEE ARTHROPLASTY;  Surgeon: Latanya Maudlin, MD;  Location: [facility];  Service: Orthopedics;  Laterality: Left;   TUBAL LIGATION      Outpatient Medications Prior to Visit  Medication Sig Dispense Refill   beta carotene w/minerals (OCUVITE) tablet Take 1 tablet by mouth every morning. Reported on 06/01/2015     CALCIUM PO Take 1 tablet by mouth daily.     cetirizine (ZYRTEC) 10 MG tablet Take 10 mg by mouth daily as needed for allergies. Reported on 06/01/2015     Cholecalciferol (VITAMIN D PO) Take 1 tablet by mouth daily.     CRANBERRY PO Take 4,200 mg by mouth. Take 2 capsules daily.     cyclobenzaprine (FLEXERIL) 10 MG tablet 1 tab po bid prn for musculoskeletal pain 30 tablet 5   diclofenac Sodium (VOLTAREN) 1 % GEL Apply 2 grams topically 4 (four) times daily. 100 g 1   docusate  sodium (COLACE) 100 MG capsule Take 100 mg by mouth daily as needed for mild constipation. Reported on 06/01/2015     fluticasone (FLONASE) 50 MCG/ACT nasal spray Place 2 sprays into both nostrils daily. 16 g 6   meloxicam (MOBIC) 15 MG tablet TAKE ONE TABLET BY MOUTH DAILY 90 tablet 0   pantoprazole (PROTONIX) 40 MG tablet Take 1 tablet (40 mg total) by mouth daily. 90 tablet 1   polyethylene glycol (MIRALAX / GLYCOLAX) packet Take 17 g by mouth daily as needed for mild constipation. Reported on 06/01/2015     Probiotic Product (PROBIOTIC PO) Take 1 tablet by mouth every morning. Reported on 06/01/2015     vitamin B-12 (CYANOCOBALAMIN) 1000 MCG tablet Take 1,000 mcg by mouth  daily.     diclofenac Sodium (VOLTAREN) 1 % GEL Apply 2 g topically 4 (four) times daily. 100 g 3   sulfamethoxazole-trimethoprim (BACTRIM DS) 800-160 MG tablet Take 1 tablet by mouth 2 (two) times daily. 10 tablet 0   No facility-administered medications prior to visit.    Allergies  Allergen Reactions   Aspirin Nausea Only    High dose only   Seasonal Ic [Cholestatin]     ROS As per HPI  PE:    02/12/2022    9:42 AM 10/12/2021    4:16 PM 08/17/2021    1:50 PM  Vitals with BMI  Height 4' 11.25" 4' 11.25" 4' 11.25"  Weight 143 lbs 141 lbs 140 lbs 6 oz  BMI 28.64 21.19 41.74  Systolic 081 448 185  Diastolic 81 74 69  Pulse 66 82 72  Initial bp 140/81, rpt 133/76  Physical Exam  Gen: Alert, well appearing.  Patient is oriented to person, place, time, and situation. AFFECT: pleasant, lucid thought and speech. Hands without swelling, tenderness, or decrease in range of motion. Neck range of motion fully intact.  Extension elicits a little pain in the midline cervical spine diffusely. Upper extremity strength 5 out of 5 proximally and distally. Knees without swelling, erythema, or tenderness.  Range of motion fully intact.  LABS:  Last CBC Lab Results  Component Value Date   WBC 5.9 08/17/2021   HGB 13.7 08/17/2021   HCT 40.2 08/17/2021   MCV 88.9 08/17/2021   MCH 30.3 08/17/2021   RDW 12.2 08/17/2021   PLT 493 (H) 63/14/9702   Last metabolic panel Lab Results  Component Value Date   GLUCOSE 88 08/15/2021   NA 140 08/15/2021   K 4.3 08/15/2021   CL 102 08/15/2021   CO2 27 08/15/2021   BUN 17 08/15/2021   CREATININE 0.80 08/15/2021   GFRNONAA >60 12/09/2014   CALCIUM 9.7 08/15/2021   PHOS 2.9 07/29/2012   PROT 7.2 08/15/2021   ALBUMIN 4.2 08/15/2021   BILITOT 0.6 08/15/2021   ALKPHOS 105 08/15/2021   AST 14 08/15/2021   ALT 13 08/15/2021   ANIONGAP 7 12/09/2014   Last lipids Lab Results  Component Value Date   CHOL 178 08/15/2021   HDL 43.90  08/15/2021   LDLCALC 115 (H) 08/15/2021   TRIG 96.0 08/15/2021   CHOLHDL 4 08/15/2021   Last hemoglobin A1c Lab Results  Component Value Date   HGBA1C 5.8 08/09/2019   Last thyroid functions Lab Results  Component Value Date   TSH 2.66 08/15/2021   IMPRESSION AND PLAN:  #1 osteoarthritis multiple sites. Continue home exercises, meloxicam, Voltaren gel.   Monitor renal function today.  2.  Constipation, stable on Colace daily and as  needed MiraLAX.  Mild thrombocytosis back in May this year, repeat CBC today.  An After Visit Summary was printed and given to the patient.  FOLLOW UP: No follow-ups on file.  Signed:  Crissie Sickles, MD           02/12/2022

## 2022-08-13 ENCOUNTER — Encounter: Payer: Medicare Other | Admitting: Family Medicine

## 2022-08-20 ENCOUNTER — Encounter: Payer: Self-pay | Admitting: Family Medicine

## 2022-08-20 ENCOUNTER — Ambulatory Visit (INDEPENDENT_AMBULATORY_CARE_PROVIDER_SITE_OTHER): Payer: Medicare Other | Admitting: Family Medicine

## 2022-08-20 VITALS — BP 118/70 | HR 78 | Temp 98.2°F | Ht 59.0 in | Wt 142.0 lb

## 2022-08-20 DIAGNOSIS — R300 Dysuria: Secondary | ICD-10-CM | POA: Diagnosis not present

## 2022-08-20 LAB — POCT URINALYSIS DIPSTICK
Bilirubin, UA: NEGATIVE
Blood, UA: NEGATIVE
Glucose, UA: NEGATIVE
Ketones, UA: NEGATIVE
Leukocytes, UA: NEGATIVE
Nitrite, UA: NEGATIVE
Protein, UA: NEGATIVE
Spec Grav, UA: >=1.03 — AB (ref 1.010–1.025)
Urobilinogen, UA: 0.2 E.U./dL
pH, UA: 6 (ref 5.0–8.0)

## 2022-08-20 NOTE — Patient Instructions (Addendum)
-  Urinalysis didn't show a urinary tract infection. Will urine sample for a culture. Office will call with results and you may see them on MyChart.  -Recommend to hydrate with mostly water and can drink some cranberry juice, 64-100oz of water a day. -Follow up sooner with your PCP or with this location if symptoms become worse or changes.

## 2022-08-20 NOTE — Progress Notes (Signed)
Acute Office Visit   Subjective:  Patient ID: Kelsey Bruce, female    DOB: Jan 27, 1951, 72 y.o.   MRN: [id number]  Chief Complaint  Patient presents with   Urinary Frequency    Pt is here today with C/O of pressure with urinating and frequency. Sx started yesterday No OTC Pt reports fall yesterday 08/19/2022 and no injuries.    HPI: Patient is a 72 year old female that presents with pressure with urinating and increase frequency. Symptoms have not been as bad today as yesterday and feeling a little sluggish.   Denies fever, abd pain, nausea, vomiting, diarrhea, or constipation. Denies hematuria. Symptoms started yesterday.  Not took any medication.   Patient reports a fall yesterday, 08/19/2022, but denies any injuries. She reports she tripped over the door frame. Denies hitting head. She reports she went down on her left knee.  Review of Systems  Genitourinary:  Positive for frequency.  See HPI above      Objective:   BP 118/70   Pulse 78   Temp 98.2 F (36.8 C)   Ht 4\' 11"  (1.499 m)   Wt 142 lb (64.4 kg)   SpO2 96%   BMI 28.68 kg/m    Physical Exam Vitals reviewed.  Constitutional:      General: She is not in acute distress.    Appearance: Normal appearance. She is not ill-appearing, toxic-appearing or diaphoretic.  HENT:     Head: Normocephalic and atraumatic.  Eyes:     General:        Right eye: No discharge.        Left eye: No discharge.     Conjunctiva/sclera: Conjunctivae normal.  Cardiovascular:     Rate and Rhythm: Normal rate and regular rhythm.     Heart sounds: Normal heart sounds. No murmur heard.    No friction rub. No gallop.  Pulmonary:     Effort: Pulmonary effort is normal. No respiratory distress.     Breath sounds: Normal breath sounds.  Abdominal:     General: Bowel sounds are normal. There is no distension.     Palpations: Abdomen is soft.     Tenderness: There is no abdominal tenderness. There is no right CVA tenderness,  left CVA tenderness or guarding.  Musculoskeletal:        General: Normal range of motion.  Skin:    General: Skin is warm and dry.  Neurological:     General: No focal deficit present.     Mental Status: She is alert and oriented to person, place, and time. Mental status is at baseline.  Psychiatric:        Mood and Affect: Mood normal.        Behavior: Behavior normal.        Thought Content: Thought content normal.        Judgment: Judgment normal.    Results for orders placed or performed in visit on 08/20/22  POCT urinalysis dipstick  Result Value Ref Range   Color, UA     Clarity, UA     Glucose, UA Negative Negative   Bilirubin, UA NEG    Ketones, UA NEG    Spec Grav, UA >=1.030 (A) 1.010 - 1.025   Blood, UA NEG    pH, UA 6.0 5.0 - 8.0   Protein, UA Negative Negative   Urobilinogen, UA 0.2 0.2 or 1.0 E.U./dL   Nitrite, UA NEG    Leukocytes, UA Negative Negative   Appearance  Odor        Assessment & Plan:  Dysuria -     POCT urinalysis dipstick -     Urine Culture  -Urinalysis didn't show a urinary tract infection. Will urine sample for a culture. Office will call with results and she may see them on MyChart.  -Recommend to hydrate with mostly water and can drink some cranberry juice, 64-100oz of water a day. -Follow up sooner with your PCP or with this location if symptoms become worse or changes.   No follow-ups on file.  Zandra Abts, NP

## 2022-08-22 ENCOUNTER — Telehealth: Payer: Self-pay

## 2022-08-22 ENCOUNTER — Other Ambulatory Visit: Payer: Self-pay

## 2022-08-22 ENCOUNTER — Telehealth: Payer: Self-pay | Admitting: Family Medicine

## 2022-08-22 DIAGNOSIS — N390 Urinary tract infection, site not specified: Secondary | ICD-10-CM

## 2022-08-22 MED ORDER — CIPROFLOXACIN HCL 500 MG PO TABS: 500.0000 mg | ORAL_TABLET | Freq: Two times a day (BID) | ORAL | 0 refills | Status: AC

## 2022-08-22 MED ORDER — CIPROFLOXACIN HCL 500 MG PO TABS: 500.0000 mg | ORAL_TABLET | Freq: Every day | ORAL | 0 refills | Status: AC

## 2022-08-22 NOTE — Telephone Encounter (Signed)
-----   Message from Alveria Apley, NP sent at 08/22/2022  1:21 PM EDT ----- Your urine culture came back with growth of E.coli. Recommend to treat with Ciprofloxacin 500mg  tablet, 1 tablet every 12 hours for 5 days (Dx: Acute cystitis without hematuria). Follow up if not improved.

## 2022-08-22 NOTE — Telephone Encounter (Signed)
Spoke to pharmacist at [facility] and clarified that [facility] had sent in Cipro 500 mg BID per Joanna's note on 08/22/22 under urine culture

## 2022-08-22 NOTE — Telephone Encounter (Signed)
[facility] called and was needing clarity regarding medications that were sent into the pharmacy. Please call back  [phone].

## 2022-08-23 ENCOUNTER — Encounter: Payer: Self-pay | Admitting: Family Medicine

## 2022-08-23 ENCOUNTER — Ambulatory Visit (INDEPENDENT_AMBULATORY_CARE_PROVIDER_SITE_OTHER): Payer: Medicare Other | Admitting: Family Medicine

## 2022-08-23 VITALS — BP 142/80 | HR 62 | Ht 59.0 in | Wt 143.8 lb

## 2022-08-23 DIAGNOSIS — Z0000 Encounter for general adult medical examination without abnormal findings: Secondary | ICD-10-CM | POA: Diagnosis not present

## 2022-08-23 DIAGNOSIS — Z23 Encounter for immunization: Secondary | ICD-10-CM | POA: Diagnosis not present

## 2022-08-23 DIAGNOSIS — M159 Polyosteoarthritis, unspecified: Secondary | ICD-10-CM

## 2022-08-23 DIAGNOSIS — N3000 Acute cystitis without hematuria: Secondary | ICD-10-CM

## 2022-08-23 LAB — CBC
HCT: 43.6 % (ref 36.0–46.0)
Hemoglobin: 14.3 g/dL (ref 12.0–15.0)
MCHC: 32.8 g/dL (ref 30.0–36.0)
MCV: 90.1 fl (ref 78.0–100.0)
Platelets: 273 10*3/uL (ref 150.0–400.0)
RBC: 4.84 Mil/uL (ref 3.87–5.11)
RDW: 13.4 % (ref 11.5–15.5)
WBC: 5.3 10*3/uL (ref 4.0–10.5)

## 2022-08-23 LAB — COMPREHENSIVE METABOLIC PANEL
ALT: 14 U/L (ref 0–35)
AST: 18 U/L (ref 0–37)
Albumin: 4.2 g/dL (ref 3.5–5.2)
Alkaline Phosphatase: 60 U/L (ref 39–117)
BUN: 20 mg/dL (ref 6–23)
CO2: 30 mEq/L (ref 19–32)
Calcium: 9.2 mg/dL (ref 8.4–10.5)
Chloride: 103 mEq/L (ref 96–112)
Creatinine, Ser: 0.79 mg/dL (ref 0.40–1.20)
GFR: 75.49 mL/min (ref >60.00)
Glucose, Bld: 93 mg/dL (ref 70–99)
Potassium: 4.2 mEq/L (ref 3.5–5.1)
Sodium: 140 mEq/L (ref 135–145)
Total Bilirubin: 0.5 mg/dL (ref 0.2–1.2)
Total Protein: 6.9 g/dL (ref 6.0–8.3)

## 2022-08-23 LAB — URINE CULTURE
MICRO NUMBER:: 15009154
SPECIMEN QUALITY:: ADEQUATE

## 2022-08-23 LAB — LIPID PANEL
Cholesterol: 184 mg/dL (ref 0–200)
HDL: 48.3 mg/dL (ref >39.00)
LDL Cholesterol: 118 mg/dL — ABNORMAL HIGH (ref 0–99)
NonHDL: 135.42
Total CHOL/HDL Ratio: 4
Triglycerides: 86 mg/dL (ref 0.0–149.0)
VLDL: 17.2 mg/dL (ref 0.0–40.0)

## 2022-08-23 MED ORDER — PANTOPRAZOLE SODIUM 40 MG PO TBEC: 40.0000 mg | DELAYED_RELEASE_TABLET | Freq: Every day | ORAL | 1 refills | Status: DC

## 2022-08-23 MED ORDER — MELOXICAM 15 MG PO TABS: 15.0000 mg | ORAL_TABLET | Freq: Every day | ORAL | 1 refills | Status: DC

## 2022-08-23 MED ORDER — ZOSTER VAC RECOMB ADJUVANTED 50 MCG/0.5ML IM SUSR: 0.5000 mL | Freq: Once | INTRAMUSCULAR | 0 refills | Status: AC

## 2022-08-23 NOTE — Patient Instructions (Signed)

## 2022-08-23 NOTE — Progress Notes (Signed)
Office Note 08/23/2022  CC:  Chief Complaint  Patient presents with   Annual Exam    Pt is fasting.    Patient is a 72 y.o. female who is here for annual health maintenance exam and follow-up osteoarthritis of multiple sites. A/P as of last visit: "#1 osteoarthritis multiple sites. Continue home exercises, meloxicam, Voltaren gel.   Monitor renal function today.   2.  Constipation, stable on Colace daily and as needed MiraLAX.   3. Mild thrombocytosis back in May this year, repeat CBC today."  INTERIM HX:  She was seen 3 d/a for acute urinary concerns--> urgency, frequency, burning. UA normal, urine clx showed e coli-->susceptibility info pending.  She was rx'd cipro 500 bid x 5d yesterday. For the 3 months prior to this she has been feeling a bit more urinary urgency in general.   Past Medical History:  Diagnosis Date   Allergy    Borderline hyperlipidemia    Framingham CV risk 2017= 6%.   Cataract    Cerebral palsy (HCC)    Colon polyp, hyperplastic 2004; 12/2015   Recall 12/2025   Difficulty sleeping    Endometrial adenocarcinoma (HCC) 09/2013   Dr. Andrey Farmer: pt got vaginal brachytherapy via rad onc.  No sign of dz recurrence as of 02/2016 Gyn Onc f/u.  Next f/u with them is 6 mo.   Morton's neuroma 2012   Dr. Olevia Perches did injections in the past (left foot)   Osteoarthritis    LB, HIPs, knees (L knee end stage), hands   Osteopenia    DEXA 10/2017 was -1.5.  10/2019 T score -1.9.  11/2021 T score -1.7   Patellofemoral arthralgia of left knee 12/24/2013   Radiation 12/16/13, 12/22/13, 12/30/13, 01/06/14, 01/13/14   proximal vagina 30 gray   Right hip pain 2021   more radicular, like DDD etiology->PT, [facility]   Rotator cuff tendonitis 04/21/2013   Seasonal allergic rhinitis     Past Surgical History:  Procedure Laterality Date   COLONOSCOPY  09/2002; 12/2015   2004 hyperplastic; recall sent 2009 but pt apparently didn't respond.  12/2015: one sessile serrated polyp  w/out cytologic atypia: recall 10 yrs per GI.   DEXA  08/2007; 11/14/15; 10/2017;10/2019   2009 T score -1.2.  2017 T score -1.6.  2019 T score -1.5. 10/2019 0-1.9.  11/2021 T score -1.7.   DILATION AND CURETTAGE OF UTERUS     LEG SURGERY Left 01/2014   "cleaned out cartilage"   ROBOTIC ASSISTED TOTAL HYSTERECTOMY WITH BILATERAL SALPINGO OOPHERECTOMY Bilateral 10/05/2013   Procedure: ROBOTIC ASSISTED TOTAL HYSTERECTOMY WITH BILATERAL SALPINGO OOPHORECTOMY WITH LYMPH NODE DISECTION;  Surgeon: Adolphus Birchwood, MD;  Location: [facility];  Service: Gynecology;  Laterality: Bilateral;   TONSILLECTOMY     TOTAL KNEE ARTHROPLASTY Left 12/07/2014   Procedure: TOTAL KNEE ARTHROPLASTY;  Surgeon: Ranee Gosselin, MD;  Location: [facility];  Service: Orthopedics;  Laterality: Left;   TUBAL LIGATION      Family History  Problem Relation Age of Onset   Heart disease Father    Hypertension Father    Cancer Father        lung   Arthritis Father    Alcohol abuse Father    Heart disease Mother    Cancer Mother        lung   Deep vein thrombosis Mother    Alcohol abuse Mother    Heart disease Brother    Hypertension Brother    Heart disease Brother  Hypertension Brother    Heart disease Brother    Hypertension Brother    Stomach cancer Maternal Grandfather    Colon cancer Neg Hx    Esophageal cancer Neg Hx    Rectal cancer Neg Hx     Social History   Socioeconomic History   Marital status: Divorced    Spouse name: Not on file   Number of children: 2   Years of education: Not on file   Highest education level: 12th grade  Occupational History   Occupation: retired  Tobacco Use   Smoking status: Never   Smokeless tobacco: Never  Vaping Use   Vaping Use: Never used  Substance and Sexual Activity   Alcohol use: No    Comment: occasional   Drug use: No   Sexual activity: Not on file  Other Topics Concern   Not on file  Social History Narrative   Divorced.  Two children.   Orig from [place].   HS education.   Retired.   No tobacco, occ alcohol, no drugs.   Has 3 brothers and they all smoked, as did her parents.   Social Determinants of Health   Financial Resource Strain: Low Risk  (10/03/2021)   Overall Financial Resource Strain (CARDIA)    Difficulty of Paying Living Expenses: Not hard at all  Food Insecurity: No Food Insecurity (10/03/2021)   Hunger Vital Sign    Worried About Running Out of Food in the Last Year: Never true    Ran Out of Food in the Last Year: Never true  Transportation Needs: No Transportation Needs (10/03/2021)   PRAPARE - Administrator, Civil Service (Medical): No    Lack of Transportation (Non-Medical): No  Physical Activity: Insufficiently Active (10/03/2021)   Exercise Vital Sign    Days of Exercise per Week: 2 days    Minutes of Exercise per Session: 30 min  Stress: No Stress Concern Present (10/03/2021)   [employer] of Occupational Health - Occupational Stress Questionnaire    Feeling of Stress : Not at all  Social Connections: Moderately Isolated (10/03/2021)   Social Connection and Isolation Panel [NHANES]    Frequency of Communication with Friends and Family: More than three times a week    Frequency of Social Gatherings with Friends and Family: More than three times a week    Attends Religious Services: More than 4 times per year    Active Member of [employer] or Organizations: No    Attends Banker Meetings: Never    Marital Status: Divorced  Catering manager Violence: Not At Risk (10/03/2021)   Humiliation, Afraid, Rape, and Kick questionnaire    Fear of Current or Ex-Partner: No    Emotionally Abused: No    Physically Abused: No    Sexually Abused: No    Outpatient Medications Prior to Visit  Medication Sig Dispense Refill   beta carotene w/minerals (OCUVITE) tablet Take 1 tablet by mouth every morning. Reported on 06/01/2015     CALCIUM PO Take 1 tablet by mouth daily.     cetirizine (ZYRTEC) 10 MG  tablet Take 10 mg by mouth daily as needed for allergies. Reported on 06/01/2015     Cholecalciferol (VITAMIN D PO) Take 1 tablet by mouth daily.     ciprofloxacin (CIPRO) 500 MG tablet Take 1 tablet (500 mg total) by mouth daily for 5 days. 5 tablet 0   ciprofloxacin (CIPRO) 500 MG tablet Take 1 tablet (500 mg total) by  mouth 2 (two) times daily for 5 days. 10 tablet 0   CRANBERRY PO Take 4,200 mg by mouth. Take 2 capsules daily.     cyclobenzaprine (FLEXERIL) 10 MG tablet 1 tab po bid prn for musculoskeletal pain 30 tablet 5   diclofenac Sodium (VOLTAREN) 1 % GEL Apply 2 g topically 4 (four) times daily. 100 g 3   docusate sodium (COLACE) 100 MG capsule Take 100 mg by mouth daily as needed for mild constipation. Reported on 06/01/2015     fluticasone (FLONASE) 50 MCG/ACT nasal spray Place 2 sprays into both nostrils daily. 16 g 6   polyethylene glycol (MIRALAX / GLYCOLAX) packet Take 17 g by mouth daily as needed for mild constipation. Reported on 06/01/2015     Probiotic Product (PROBIOTIC PO) Take 1 tablet by mouth every morning. Reported on 06/01/2015     vitamin B-12 (CYANOCOBALAMIN) 1000 MCG tablet Take 1,000 mcg by mouth daily.     meloxicam (MOBIC) 15 MG tablet Take 1 tablet (15 mg total) by mouth daily. 90 tablet 1   pantoprazole (PROTONIX) 40 MG tablet Take 1 tablet (40 mg total) by mouth daily. 90 tablet 1   No facility-administered medications prior to visit.    Allergies  Allergen Reactions   Aspirin Nausea Only    High dose only   Seasonal Ic [Cholestatin]     Review of Systems  Constitutional:  Negative for appetite change, chills, fatigue and fever.  HENT:  Negative for congestion, dental problem, ear pain and sore throat.   Eyes:  Negative for discharge, redness and visual disturbance.  Respiratory:  Negative for cough, chest tightness, shortness of breath and wheezing.   Cardiovascular:  Negative for chest pain, palpitations and leg swelling.  Gastrointestinal:  Negative for  abdominal pain, blood in stool, diarrhea, nausea and vomiting.  Genitourinary:  Positive for frequency and urgency. Negative for difficulty urinating, dysuria, flank pain and hematuria.  Musculoskeletal:  Negative for arthralgias, back pain, joint swelling, myalgias and neck stiffness.  Skin:  Negative for pallor and rash.  Neurological:  Negative for dizziness, speech difficulty, weakness and headaches.  Hematological:  Negative for adenopathy. Does not bruise/bleed easily.  Psychiatric/Behavioral:  Negative for confusion and sleep disturbance. The patient is not nervous/anxious.     PE;    08/23/2022    9:42 AM 08/20/2022    3:30 PM 02/12/2022   10:05 AM  Vitals with BMI  Height 4\' 11"  4\' 11"    Weight 143 lbs 13 oz 142 lbs   BMI 29.03 28.67   Systolic 142 118 604  Diastolic 80 70 76  Pulse 62 78   Initial bp today 142/80. Rpt was 134/70 Female CMA chaperone today Gen: Alert, well appearing.  Patient is oriented to person, place, time, and situation. AFFECT: pleasant, lucid thought and speech. ENT: Ears: EACs clear, normal epithelium.  TMs with good light reflex and landmarks bilaterally.  Eyes: no injection, icteris, swelling, or exudate.  EOMI, PERRLA. Nose: no drainage or turbinate edema/swelling.  No injection or focal lesion.  Mouth: lips without lesion/swelling.  Oral mucosa pink and moist.  Dentition intact and without obvious caries or gingival swelling.  Oropharynx without erythema, exudate, or swelling.  Neck: supple/nontender.  No LAD, mass, or TM.  Carotid pulses 2+ bilaterally, without bruits. CV: RRR, no m/r/g.   LUNGS: CTA bilat, nonlabored resps, good aeration in all lung fields. ABD: soft, NT, ND, BS normal.  No hepatospenomegaly or mass.  No bruits. EXT: no  clubbing, cyanosis, or edema.  Musculoskeletal: no joint swelling, erythema, warmth, or tenderness.  ROM of all joints intact. Skin - no sores or suspicious lesions or rashes or color changes  Pertinent labs:   Lab Results  Component Value Date   TSH 2.66 08/15/2021   Lab Results  Component Value Date   WBC 6.2 02/12/2022   HGB 15.2 (H) 02/12/2022   HCT 45.6 02/12/2022   MCV 90.1 02/12/2022   PLT 300.0 02/12/2022   Lab Results  Component Value Date   CREATININE 0.76 02/12/2022   BUN 17 02/12/2022   NA 139 02/12/2022   K 4.7 02/12/2022   CL 102 02/12/2022   CO2 30 02/12/2022   Lab Results  Component Value Date   ALT 13 08/15/2021   AST 14 08/15/2021   ALKPHOS 105 08/15/2021   BILITOT 0.6 08/15/2021   Lab Results  Component Value Date   CHOL 178 08/15/2021   Lab Results  Component Value Date   HDL 43.90 08/15/2021   Lab Results  Component Value Date   LDLCALC 115 (H) 08/15/2021   Lab Results  Component Value Date   TRIG 96.0 08/15/2021   Lab Results  Component Value Date   CHOLHDL 4 08/15/2021   Lab Results  Component Value Date   HGBA1C 5.8 08/09/2019   ASSESSMENT AND PLAN:   #1 health maintenance exam: Reviewed age and gender appropriate health maintenance issues (prudent diet, regular exercise, health risks of tobacco and excessive alcohol, use of seatbelts, fire alarms in home, use of sunscreen).  Also reviewed age and gender appropriate health screening as well as vaccine recommendations. Vaccines: ALL UTD except shingrix-->rx sent again. Labs: cmet, cbc, flp. Cervical ca screening: hx of endometrial ca, pt s/p TAH/BSO 5 yrs ago.  F/u with GYN MD. Breast ca screening: next mammo due 11/2022. Osteoporosis screening: hx of osteopenia.  Plan repeat DEXA 11/2023. She takes ca and vit D supp daily. Colon ca screening: recall 2027.   #2 UTI, E. coli, susceptibility testing is pending. She started Cipro yesterday.  3.  Osteoarthritis, multiple sites. Stable on meloxicam 15 mg a day.  Refilled today.  An After Visit Summary was printed and given to the patient.  FOLLOW UP:  Return in about 6 months (around 02/22/2023) for routine chronic illness  f/u.  Signed:  Santiago Bumpers, MD           08/23/2022

## 2022-09-11 DIAGNOSIS — H2513 Age-related nuclear cataract, bilateral: Secondary | ICD-10-CM | POA: Diagnosis not present

## 2022-09-11 DIAGNOSIS — H353131 Nonexudative age-related macular degeneration, bilateral, early dry stage: Secondary | ICD-10-CM | POA: Diagnosis not present

## 2022-09-13 ENCOUNTER — Ambulatory Visit (INDEPENDENT_AMBULATORY_CARE_PROVIDER_SITE_OTHER): Payer: Medicare Other | Admitting: Family Medicine

## 2022-09-13 ENCOUNTER — Encounter: Payer: Self-pay | Admitting: Family Medicine

## 2022-09-13 VITALS — BP 130/80 | HR 66 | Wt 143.0 lb

## 2022-09-13 DIAGNOSIS — Z77120 Contact with and (suspected) exposure to mold (toxic): Secondary | ICD-10-CM | POA: Diagnosis not present

## 2022-09-13 NOTE — Progress Notes (Signed)
OFFICE VISIT  09/13/2022  CC:  Chief Complaint  Patient presents with   Acute Visit    Mold exposure she wants to make sure her lungs are clear. She has been having headaches and not sure if that's related.    Patient is a 72 y.o. female who presents for concern about mold exposure.  HPI: She recently uncovered a lot of mold under her wood floor and had to have it all replaced.  She was concerned about exposure and wanted to be checked out.  She has no nasal congestion, sneezing, nasal mucus, sore throat, cough, or wheeze. Her appetite is good and energy level is good.   Past Medical History:  Diagnosis Date   Allergy    Borderline hyperlipidemia    Framingham CV risk 2017= 6%.   Cataract    Cerebral palsy (HCC)    Colon polyp, hyperplastic 2004; 12/2015   Recall 12/2025   Difficulty sleeping    Endometrial adenocarcinoma (HCC) 09/2013   Dr. Andrey Farmer: pt got vaginal brachytherapy via rad onc.  No sign of dz recurrence as of 02/2016 Gyn Onc f/u.  Next f/u with them is 6 mo.   Morton's neuroma 2012   Dr. Olevia Perches did injections in the past (left foot)   Osteoarthritis    LB, HIPs, knees (L knee end stage), hands   Osteopenia    DEXA 10/2017 was -1.5.  10/2019 T score -1.9.  11/2021 T score -1.7   Patellofemoral arthralgia of left knee 12/24/2013   Radiation 12/16/13, 12/22/13, 12/30/13, 01/06/14, 01/13/14   proximal vagina 30 gray   Right hip pain 2021   more radicular, like DDD etiology->[facility], [facility]   Rotator cuff tendonitis 04/21/2013   Seasonal allergic rhinitis     Past Surgical History:  Procedure Laterality Date   COLONOSCOPY  09/2002; 12/2015   2004 hyperplastic; recall sent 2009 but pt apparently didn't respond.  12/2015: one sessile serrated polyp w/out cytologic atypia: recall 10 yrs per GI.   DEXA  08/2007; 11/14/15; 10/2017;10/2019   2009 T score -1.2.  2017 T score -1.6.  2019 T score -1.5. 10/2019 0-1.9.  11/2021 T score -1.7.   DILATION AND CURETTAGE OF UTERUS      LEG SURGERY Left 01/2014   "cleaned out cartilage"   ROBOTIC ASSISTED TOTAL HYSTERECTOMY WITH BILATERAL SALPINGO OOPHERECTOMY Bilateral 10/05/2013   Procedure: ROBOTIC ASSISTED TOTAL HYSTERECTOMY WITH BILATERAL SALPINGO OOPHORECTOMY WITH LYMPH NODE DISECTION;  Surgeon: Adolphus Birchwood, MD;  Location: [facility];  Service: Gynecology;  Laterality: Bilateral;   TONSILLECTOMY     TOTAL KNEE ARTHROPLASTY Left 12/07/2014   Procedure: TOTAL KNEE ARTHROPLASTY;  Surgeon: Ranee Gosselin, MD;  Location: [facility];  Service: Orthopedics;  Laterality: Left;   TUBAL LIGATION      Outpatient Medications Prior to Visit  Medication Sig Dispense Refill   beta carotene w/minerals (OCUVITE) tablet Take 1 tablet by mouth every morning. Reported on 06/01/2015     CALCIUM PO Take 1 tablet by mouth daily.     cetirizine (ZYRTEC) 10 MG tablet Take 10 mg by mouth daily as needed for allergies. Reported on 06/01/2015     Cholecalciferol (VITAMIN D PO) Take 1 tablet by mouth daily.     CRANBERRY PO Take 4,200 mg by mouth. Take 2 capsules daily.     cyclobenzaprine (FLEXERIL) 10 MG tablet 1 tab po bid prn for musculoskeletal pain 30 tablet 5   diclofenac Sodium (VOLTAREN) 1 % GEL Apply 2 g topically 4 (four)  times daily. 100 g 3   docusate sodium (COLACE) 100 MG capsule Take 100 mg by mouth daily as needed for mild constipation. Reported on 06/01/2015     fluticasone (FLONASE) 50 MCG/ACT nasal spray Place 2 sprays into both nostrils daily. 16 g 6   meloxicam (MOBIC) 15 MG tablet Take 1 tablet (15 mg total) by mouth daily. 90 tablet 1   pantoprazole (PROTONIX) 40 MG tablet Take 1 tablet (40 mg total) by mouth daily. 90 tablet 1   polyethylene glycol (MIRALAX / GLYCOLAX) packet Take 17 g by mouth daily as needed for mild constipation. Reported on 06/01/2015     Probiotic Product (PROBIOTIC PO) Take 1 tablet by mouth every morning. Reported on 06/01/2015     vitamin B-12 (CYANOCOBALAMIN) 1000 MCG tablet Take 1,000 mcg by mouth daily.     No  facility-administered medications prior to visit.    Allergies  Allergen Reactions   Aspirin Nausea Only    High dose only   Seasonal Ic [Cholestatin]     Review of Systems  As per HPI  PE:    09/13/2022    9:53 AM 08/23/2022    9:42 AM 08/20/2022    3:30 PM  Vitals with BMI  Height  4\' 11"  4\' 11"   Weight 143 lbs 143 lbs 13 oz 142 lbs  BMI 28.87 29.03 28.67  Systolic 130 142 756  Diastolic 80 80 70  Pulse 66 62 78   Physical Exam  Gen: Alert, well appearing.  Patient is oriented to person, place, time, and situation. AFFECT: pleasant, lucid thought and speech. ENT: Ears: EACs clear, normal epithelium.  TMs with good light reflex and landmarks bilaterally.  Eyes: no injection, icteris, swelling, or exudate.  EOMI, PERRLA. Nose: no drainage or turbinate edema/swelling.  No injection or focal lesion.  Mouth: lips without lesion/swelling.  Oral mucosa pink and moist.  Dentition intact and without obvious caries or gingival swelling.  Oropharynx without erythema, exudate, or swelling.  NECK: no adenopathy CV: RRR, no m/r/g.   LUNGS: CTA bilat, nonlabored resps, good aeration in all lung fields.  LABS:  Last CBC Lab Results  Component Value Date   WBC 5.3 08/23/2022   HGB 14.3 08/23/2022   HCT 43.6 08/23/2022   MCV 90.1 08/23/2022   MCH 30.3 08/17/2021   RDW 13.4 08/23/2022   PLT 273.0 08/23/2022   Last metabolic panel Lab Results  Component Value Date   GLUCOSE 93 08/23/2022   NA 140 08/23/2022   K 4.2 08/23/2022   CL 103 08/23/2022   CO2 30 08/23/2022   BUN 20 08/23/2022   CREATININE 0.79 08/23/2022   GFRNONAA >60 12/09/2014   CALCIUM 9.2 08/23/2022   PHOS 2.9 07/29/2012   PROT 6.9 08/23/2022   ALBUMIN 4.2 08/23/2022   BILITOT 0.5 08/23/2022   ALKPHOS 60 08/23/2022   AST 18 08/23/2022   ALT 14 08/23/2022   ANIONGAP 7 12/09/2014    IMPRESSION AND PLAN:  Mold exposure. Fortunately, this has not affected her any.  She has remedied the situation at home  now.  Reassured.  An After Visit Summary was printed and given to the patient.  FOLLOW UP: No follow-ups on file.  Signed:  Santiago Bumpers, MD           09/13/2022

## 2022-10-31 ENCOUNTER — Telehealth (INDEPENDENT_AMBULATORY_CARE_PROVIDER_SITE_OTHER): Payer: Medicare Other | Admitting: Family Medicine

## 2022-10-31 ENCOUNTER — Encounter: Payer: Self-pay | Admitting: Family Medicine

## 2022-10-31 VITALS — Ht 59.0 in | Wt 143.0 lb

## 2022-10-31 DIAGNOSIS — U071 COVID-19: Secondary | ICD-10-CM

## 2022-10-31 DIAGNOSIS — J988 Other specified respiratory disorders: Secondary | ICD-10-CM

## 2022-10-31 MED ORDER — NIRMATRELVIR/RITONAVIR (PAXLOVID)TABLET: 3.0000 | ORAL_TABLET | Freq: Two times a day (BID) | ORAL | 0 refills | Status: AC

## 2022-10-31 NOTE — Progress Notes (Addendum)
Virtual Visit via Video Note  I connected with Kelsey Bruce  on 10/31/22 at  3:20 PM EDT by telephone.   A video enabled telemedicine application was attempted but technical complications persisted. Location patient: [place] Location provider:work or home office Persons participating in the virtual visit: patient, provider  I discussed the limitations and requested verbal permission for telemedicine visit. The patient expressed understanding and agreed to proceed.  CC:  runny nose and headache.  HPI: 72 year old female with about 48-hour history of nasal congestion/runny nose, some postnasal drip, some dry hacking cough.  Feels tired/fatigued.  No fever, no chest pain, no wheezing, no shortness of breath. No nausea, vomiting, or diarrhea. Over-the-counter cough medicine has helped. Home COVID test positive today.    ROS: See pertinent positives and negatives per HPI.  Past Medical History:  Diagnosis Date   Allergy    Borderline hyperlipidemia    Framingham CV risk 2017= 6%.   Cataract    Cerebral palsy (HCC)    Colon polyp, hyperplastic 2004; 12/2015   Recall 12/2025   Difficulty sleeping    Endometrial adenocarcinoma (HCC) 09/2013   Dr. Andrey Farmer: pt got vaginal brachytherapy via rad onc.  No sign of dz recurrence as of 02/2016 Gyn Onc f/u.  Next f/u with them is 6 mo.   Morton's neuroma 2012   Dr. Olevia Perches did injections in the past (left foot)   Osteoarthritis    LB, HIPs, knees (L knee end stage), hands   Osteopenia    DEXA 10/2017 was -1.5.  10/2019 T score -1.9.  11/2021 T score -1.7   Patellofemoral arthralgia of left knee 12/24/2013   Radiation 12/16/13, 12/22/13, 12/30/13, 01/06/14, 01/13/14   proximal vagina 30 gray   Right hip pain 2021   more radicular, like DDD etiology->[facility], [facility]   Rotator cuff tendonitis 04/21/2013   Seasonal allergic rhinitis     Past Surgical History:  Procedure Laterality Date   COLONOSCOPY  09/2002; 12/2015   2004 hyperplastic; recall sent 2009  but pt apparently didn't respond.  12/2015: one sessile serrated polyp w/out cytologic atypia: recall 10 yrs per GI.   DEXA  08/2007; 11/14/15; 10/2017;10/2019   2009 T score -1.2.  2017 T score -1.6.  2019 T score -1.5. 10/2019 0-1.9.  11/2021 T score -1.7.   DILATION AND CURETTAGE OF UTERUS     LEG SURGERY Left 01/2014   "cleaned out cartilage"   ROBOTIC ASSISTED TOTAL HYSTERECTOMY WITH BILATERAL SALPINGO OOPHERECTOMY Bilateral 10/05/2013   Procedure: ROBOTIC ASSISTED TOTAL HYSTERECTOMY WITH BILATERAL SALPINGO OOPHORECTOMY WITH LYMPH NODE DISECTION;  Surgeon: Adolphus Birchwood, MD;  Location: [facility];  Service: Gynecology;  Laterality: Bilateral;   TONSILLECTOMY     TOTAL KNEE ARTHROPLASTY Left 12/07/2014   Procedure: TOTAL KNEE ARTHROPLASTY;  Surgeon: Ranee Gosselin, MD;  Location: [facility];  Service: Orthopedics;  Laterality: Left;   TUBAL LIGATION       Current Outpatient Medications:    beta carotene w/minerals (OCUVITE) tablet, Take 1 tablet by mouth every morning. Reported on 06/01/2015, Disp: , Rfl:    CALCIUM PO, Take 1 tablet by mouth daily., Disp: , Rfl:    cetirizine (ZYRTEC) 10 MG tablet, Take 10 mg by mouth daily as needed for allergies. Reported on 06/01/2015, Disp: , Rfl:    Cholecalciferol (VITAMIN D PO), Take 1 tablet by mouth daily., Disp: , Rfl:    CRANBERRY PO, Take 4,200 mg by mouth. Take 2 capsules daily., Disp: , Rfl:    cyclobenzaprine (FLEXERIL) 10  MG tablet, 1 tab po bid prn for musculoskeletal pain, Disp: 30 tablet, Rfl: 5   diclofenac Sodium (VOLTAREN) 1 % GEL, Apply 2 g topically 4 (four) times daily., Disp: 100 g, Rfl: 3   docusate sodium (COLACE) 100 MG capsule, Take 100 mg by mouth daily as needed for mild constipation. Reported on 06/01/2015, Disp: , Rfl:    fluticasone (FLONASE) 50 MCG/ACT nasal spray, Place 2 sprays into both nostrils daily., Disp: 16 g, Rfl: 6   meloxicam (MOBIC) 15 MG tablet, Take 1 tablet (15 mg total) by mouth daily., Disp: 90 tablet, Rfl: 1    pantoprazole (PROTONIX) 40 MG tablet, Take 1 tablet (40 mg total) by mouth daily., Disp: 90 tablet, Rfl: 1   polyethylene glycol (MIRALAX / GLYCOLAX) packet, Take 17 g by mouth daily as needed for mild constipation. Reported on 06/01/2015, Disp: , Rfl:    Probiotic Product (PROBIOTIC PO), Take 1 tablet by mouth every morning. Reported on 06/01/2015, Disp: , Rfl:    vitamin B-12 (CYANOCOBALAMIN) 1000 MCG tablet, Take 1,000 mcg by mouth daily., Disp: , Rfl:   EXAM:  VITALS per patient if applicable:     10/31/2022    2:23 PM 09/13/2022    9:53 AM 08/23/2022    9:42 AM  Vitals with BMI  Height 4\' 11"   4\' 11"   Weight 143 lbs 143 lbs 143 lbs 13 oz  BMI 28.87 28.87 29.03  Systolic  130 142  Diastolic  80 80  Pulse  66 62  Pt sounded well, w/out sob or sign of distress. No further exam b/c audio visit only.  LABS: none today    Chemistry      Component Value Date/Time   NA 140 08/23/2022 1010   K 4.2 08/23/2022 1010   CL 103 08/23/2022 1010   CO2 30 08/23/2022 1010   BUN 20 08/23/2022 1010   CREATININE 0.79 08/23/2022 1010   CREATININE 0.86 10/09/2020 1511      Component Value Date/Time   CALCIUM 9.2 08/23/2022 1010   ALKPHOS 60 08/23/2022 1010   AST 18 08/23/2022 1010   ALT 14 08/23/2022 1010   BILITOT 0.5 08/23/2022 1010      ASSESSMENT AND PLAN:  Discussed the following assessment and plan:  COVID-19 respiratory infection. Her risk factors for complication due to COVID infection are: Age greater than 65 and BMI of 28. GFR 75 in May this year. Paxlovid prescribed. Continue over-the-counter symptomatic care.   Spent 7 min with pt today on telephone reviewing HPI, reviewing relevant past history, and formulating plans. , I discussed the assessment and treatment plan with the patient. The patient was provided an opportunity to ask questions and all were answered. The patient agreed with the plan and demonstrated an understanding of the instructions.   F/u: If not  significantly improving in 3 to 4 days.  Signed:  Santiago Bumpers, MD           10/31/2022

## 2022-11-11 ENCOUNTER — Other Ambulatory Visit: Payer: Self-pay | Admitting: Family Medicine

## 2022-11-11 DIAGNOSIS — Z23 Encounter for immunization: Secondary | ICD-10-CM

## 2022-11-13 ENCOUNTER — Ambulatory Visit (INDEPENDENT_AMBULATORY_CARE_PROVIDER_SITE_OTHER): Payer: Medicare Other

## 2022-11-13 VITALS — Wt 143.0 lb

## 2022-11-13 DIAGNOSIS — Z0000 Encounter for general adult medical examination without abnormal findings: Secondary | ICD-10-CM | POA: Diagnosis not present

## 2022-11-13 NOTE — Patient Instructions (Signed)
Kelsey Bruce , Thank you for taking time to come for your Medicare Wellness Visit. I appreciate your ongoing commitment to your health goals. Please review the following plan we discussed and let me know if I can assist you in the future.   Referrals/Orders/Follow-Ups/Clinician Recommendations: walking for better balance   This is a list of the screening recommended for you and due dates:  Health Maintenance  Topic Date Due   COVID-19 Vaccine (4 - 2023-24 season) 11/23/2021   Zoster (Shingles) Vaccine (2 of 2) 10/25/2022   Flu Shot  10/24/2022   Mammogram  12/12/2022   Medicare Annual Wellness Visit  11/13/2023   DTaP/Tdap/Td vaccine (2 - Td or Tdap) 09/29/2024   Colon Cancer Screening  01/03/2026   Pneumonia Vaccine  Completed   DEXA scan (bone density measurement)  Completed   Hepatitis C Screening  Completed   HPV Vaccine  Aged Out    Advanced directives: (Declined) Advance directive discussed with you today. Even though you declined this today, please call our office should you change your mind, and we can give you the proper paperwork for you to fill out.  Next Medicare Annual Wellness Visit scheduled for next year: Yes

## 2022-11-13 NOTE — Progress Notes (Signed)
Subjective:   Kelsey Bruce is a 72 y.o. female who presents for Medicare Annual (Subsequent) preventive examination.  Visit Complete: Virtual  I connected with  Kelsey Bruce on 11/13/22 by a audio enabled telemedicine application and verified that I am speaking with the correct person using two identifiers.  Patient Location: Home  Provider Location: Home Office  I discussed the limitations of evaluation and management by telemedicine. The patient expressed understanding and agreed to proceed.  Vital Signs: Unable to obtain new vitals due to this being a telehealth visit.  Review of Systems     Cardiac Risk Factors include: advanced age (>40men, >74 women)     Objective:    Today's Vitals   11/13/22 1028  Weight: 143 lb (64.9 kg)   Body mass index is 28.88 kg/m.     11/13/2022   10:33 AM 10/03/2021    9:23 AM 09/20/2020   11:45 AM 11/16/2018   11:21 AM 05/18/2018   10:07 AM 11/13/2017    8:35 AM 08/14/2017   11:48 AM  Advanced Directives  Does Patient Have a Medical Advance Directive? No Yes No No No No No  Does patient want to make changes to medical advance directive?  Yes (MAU/Ambulatory/Procedural Areas - Information given)       Would patient like information on creating a medical advance directive? No - Patient declined  No - Patient declined        Current Medications (verified) Outpatient Encounter Medications as of 11/13/2022  Medication Sig   beta carotene w/minerals (OCUVITE) tablet Take 1 tablet by mouth every morning. Reported on 06/01/2015   CALCIUM PO Take 1 tablet by mouth daily.   cetirizine (ZYRTEC) 10 MG tablet Take 10 mg by mouth daily as needed for allergies. Reported on 06/01/2015   Cholecalciferol (VITAMIN D PO) Take 1 tablet by mouth daily.   CRANBERRY PO Take 4,200 mg by mouth. Take 2 capsules daily.   cyclobenzaprine (FLEXERIL) 10 MG tablet 1 tab po bid prn for musculoskeletal pain   diclofenac Sodium (VOLTAREN) 1 % GEL Apply 2  g topically 4 (four) times daily.   docusate sodium (COLACE) 100 MG capsule Take 100 mg by mouth daily as needed for mild constipation. Reported on 06/01/2015   fluticasone (FLONASE) 50 MCG/ACT nasal spray Place 2 sprays into both nostrils daily.   meloxicam (MOBIC) 15 MG tablet Take 1 tablet (15 mg total) by mouth daily.   pantoprazole (PROTONIX) 40 MG tablet Take 1 tablet (40 mg total) by mouth daily.   polyethylene glycol (MIRALAX / GLYCOLAX) packet Take 17 g by mouth daily as needed for mild constipation. Reported on 06/01/2015   Probiotic Product (PROBIOTIC PO) Take 1 tablet by mouth every morning. Reported on 06/01/2015   vitamin B-12 (CYANOCOBALAMIN) 1000 MCG tablet Take 1,000 mcg by mouth daily.   No facility-administered encounter medications on file as of 11/13/2022.    Allergies (verified) Aspirin and Seasonal ic [cholestatin]   History: Past Medical History:  Diagnosis Date   Allergy    Borderline hyperlipidemia    [place] CV risk 2017= 6%.   Cataract    Cerebral palsy (HCC)    Colon polyp, hyperplastic 2004; 12/2015   Recall 12/2025   Difficulty sleeping    Endometrial adenocarcinoma (HCC) 09/2013   Dr. Andrey Farmer: pt got vaginal brachytherapy via rad onc.  No sign of dz recurrence as of 02/2016 Gyn Onc f/u.  Next f/u with them is 6 mo.   Morton's neuroma  2012   Dr. Olevia Perches did injections in the past (left foot)   Osteoarthritis    LB, HIPs, knees (L knee end stage), hands   Osteopenia    DEXA 10/2017 was -1.5.  10/2019 T score -1.9.  11/2021 T score -1.7   Patellofemoral arthralgia of left knee 12/24/2013   Radiation 12/16/13, 12/22/13, 12/30/13, 01/06/14, 01/13/14   proximal vagina 30 gray   Right hip pain 2021   more radicular, like DDD etiology->PT, [facility]   Rotator cuff tendonitis 04/21/2013   Seasonal allergic rhinitis    Past Surgical History:  Procedure Laterality Date   COLONOSCOPY  09/2002; 12/2015   2004 hyperplastic; recall sent 2009 but pt apparently didn't  respond.  12/2015: one sessile serrated polyp w/out cytologic atypia: recall 10 yrs per GI.   DEXA  08/2007; 11/14/15; 10/2017;10/2019   2009 T score -1.2.  2017 T score -1.6.  2019 T score -1.5. 10/2019 0-1.9.  11/2021 T score -1.7.   DILATION AND CURETTAGE OF UTERUS     LEG SURGERY Left 01/2014   "cleaned out cartilage"   ROBOTIC ASSISTED TOTAL HYSTERECTOMY WITH BILATERAL SALPINGO OOPHERECTOMY Bilateral 10/05/2013   Procedure: ROBOTIC ASSISTED TOTAL HYSTERECTOMY WITH BILATERAL SALPINGO OOPHORECTOMY WITH LYMPH NODE DISECTION;  Surgeon: Adolphus Birchwood, MD;  Location: [facility];  Service: Gynecology;  Laterality: Bilateral;   TONSILLECTOMY     TOTAL KNEE ARTHROPLASTY Left 12/07/2014   Procedure: TOTAL KNEE ARTHROPLASTY;  Surgeon: Ranee Gosselin, MD;  Location: [facility];  Service: Orthopedics;  Laterality: Left;   TUBAL LIGATION     Family History  Problem Relation Age of Onset   Heart disease Father    Hypertension Father    Cancer Father        lung   Arthritis Father    Alcohol abuse Father    Heart disease Mother    Cancer Mother        lung   Deep vein thrombosis Mother    Alcohol abuse Mother    Heart disease Brother    Hypertension Brother    Heart disease Brother    Hypertension Brother    Heart disease Brother    Hypertension Brother    Stomach cancer Maternal Grandfather    Colon cancer Neg Hx    Esophageal cancer Neg Hx    Rectal cancer Neg Hx    Social History   Socioeconomic History   Marital status: Divorced    Spouse name: Not on file   Number of children: 2   Years of education: Not on file   Highest education level: 12th grade  Occupational History   Occupation: retired  Tobacco Use   Smoking status: Never   Smokeless tobacco: Never  Vaping Use   Vaping status: Never Used  Substance and Sexual Activity   Alcohol use: No    Comment: occasional   Drug use: No   Sexual activity: Not on file  Other Topics Concern   Not on file  Social History Narrative    Divorced.  Two children.   Orig from [place], [place].   HS education.   Retired.   No tobacco, occ alcohol, no drugs.   Has 3 brothers and they all smoked, as did her parents.   Social Determinants of Health   Financial Resource Strain: Low Risk  (11/13/2022)   Overall Financial Resource Strain (CARDIA)    Difficulty of Paying Living Expenses: Not hard at all  Food Insecurity: No Food Insecurity (11/13/2022)   Hunger  Vital Sign    Worried About Programme researcher, broadcasting/film/video in the Last Year: Never true    Ran Out of Food in the Last Year: Never true  Transportation Needs: No Transportation Needs (11/13/2022)   PRAPARE - Administrator, Civil Service (Medical): No    Lack of Transportation (Non-Medical): No  Physical Activity: Insufficiently Active (11/13/2022)   Exercise Vital Sign    Days of Exercise per Week: 2 days    Minutes of Exercise per Session: 30 min  Stress: No Stress Concern Present (11/13/2022)   [employer] of Occupational Health - Occupational Stress Questionnaire    Feeling of Stress : Not at all  Social Connections: Moderately Integrated (11/13/2022)   Social Connection and Isolation Panel [NHANES]    Frequency of Communication with Friends and Family: More than three times a week    Frequency of Social Gatherings with Friends and Family: More than three times a week    Attends Religious Services: More than 4 times per year    Active Member of [employer] or Organizations: Yes    Attends Banker Meetings: 1 to 4 times per year    Marital Status: Divorced    Tobacco Counseling Counseling given: Not Answered   Clinical Intake:  Pre-visit preparation completed: Yes  Pain : No/denies pain     BMI - recorded: 28.88 Nutritional Status: BMI 25 -29 Overweight Nutritional Risks: None Diabetes: No  How often do you need to have someone help you when you read instructions, pamphlets, or other written materials from your doctor or pharmacy?: 1 -  Never  Interpreter Needed?: No  Information entered by :: Lanier Ensign LPN   Activities of Daily Living    11/13/2022   10:29 AM  In your present state of health, do you have any difficulty performing the following activities:  Hearing? 0  Vision? 0  Difficulty concentrating or making decisions? 0  Walking or climbing stairs? 0  Dressing or bathing? 0  Doing errands, shopping? 0  Preparing Food and eating ? N  Using the Toilet? N  In the past six months, have you accidently leaked urine? Y  Comment wears a pad  Do you have problems with loss of bowel control? N  Managing your Medications? N  Managing your Finances? N  Housekeeping or managing your Housekeeping? N    Patient Care Team: Jeoffrey Massed, MD as PCP - General (Family Medicine) Hilarie Fredrickson, MD as Consulting Physician (Gastroenterology) Allie Bossier, MD as Consulting Physician (Obstetrics and Gynecology) Adolphus Birchwood, MD as Consulting Physician (Obstetrics and Gynecology) Ranee Gosselin, MD as Consulting Physician (Orthopedic Surgery) Carrington Clamp, DPM (Inactive) as Consulting Physician (Podiatry)  Indicate any recent Medical Services you may have received from other than [facility] providers in the past year (date may be approximate).     Assessment:   This is a routine wellness examination for [place].  Hearing/Vision screen Hearing Screening - Comments:: Pt denies any hearing issues  Vision Screening - Comments:: Pt follows up with Dr Cherlynn Polo for annual eye exams   Dietary issues and exercise activities discussed:     Goals Addressed             This Visit's Progress    Patient Stated       Walk better  for balance        Depression Screen    11/13/2022   10:31 AM 08/23/2022    9:47 AM 08/20/2022  3:29 PM 02/12/2022   10:05 AM 10/03/2021    9:22 AM 08/09/2021   11:01 AM 02/09/2021    9:40 AM  PHQ 2/9 Scores  PHQ - 2 Score 0 1 1 0 0 0 0  PHQ- 9 Score  4 7        Fall Risk     11/13/2022   10:33 AM 08/23/2022    9:47 AM 08/20/2022    3:29 PM 02/12/2022   10:06 AM 10/03/2021    9:24 AM  Fall Risk   Falls in the past year? 1 0 1 1 1   Number falls in past yr: 1  0 1 1  Injury with Fall? 0  0 0 0  Risk for fall due to : Impaired vision;Impaired balance/gait;Impaired mobility  History of fall(s) Impaired vision Impaired vision;Impaired balance/gait  Follow up Falls prevention discussed   Falls evaluation completed Falls prevention discussed    MEDICARE RISK AT HOME: Medicare Risk at Home Any stairs in or around the home?: Yes If so, are there any without handrails?: No Home free of loose throw rugs in walkways, pet beds, electrical cords, etc?: Yes Adequate lighting in your home to reduce risk of falls?: Yes Life alert?: No Use of a cane, walker or w/c?: No Grab bars in the bathroom?: No Shower chair or bench in shower?: No Elevated toilet seat or a handicapped toilet?: No  TIMED UP AND GO:  Was the test performed?  No    Cognitive Function:        11/13/2022   10:35 AM 10/03/2021    9:26 AM  6CIT Screen  What Year? 0 points 0 points  What month? 0 points 0 points  What time? 0 points 0 points  Count back from 20 0 points 0 points  Months in reverse 0 points 0 points  Repeat phrase 0 points 0 points  Total Score 0 points 0 points    Immunizations Immunization History  Administered Date(s) Administered   Fluad Quad(high Dose 65+) 01/20/2020, 02/09/2021, 12/25/2021   Influenza, High Dose Seasonal PF 12/23/2017, 01/12/2019   PFIZER(Purple Top)SARS-COV-2 Vaccination 06/02/2019, 06/30/2019, 04/06/2020   PNEUMOCOCCAL CONJUGATE-20 08/11/2020   Pneumococcal Polysaccharide-23 07/15/2019   Respiratory Syncytial Virus Vaccine,Recomb Aduvanted(Arexvy) 01/10/2022   Tdap 09/30/2014   Zoster Recombinant(Shingrix) 08/30/2022    TDAP status: Up to date  Flu Vaccine status: Due, Education has been provided regarding the importance of this vaccine. Advised  may receive this vaccine at local pharmacy or Health Dept. Aware to provide a copy of the vaccination record if obtained from local pharmacy or Health Dept. Verbalized acceptance and understanding.  Pneumococcal vaccine status: Up to date  Covid-19 vaccine status: Declined, Education has been provided regarding the importance of this vaccine but patient still declined. Advised may receive this vaccine at local pharmacy or Health Dept.or vaccine clinic. Aware to provide a copy of the vaccination record if obtained from local pharmacy or Health Dept. Verbalized acceptance and understanding.  Qualifies for Shingles Vaccine? Yes   Zostavax completed No   Shingrix Completed?: No.    Education has been provided regarding the importance of this vaccine. Patient has been advised to call insurance company to determine out of pocket expense if they have not yet received this vaccine. Advised may also receive vaccine at local pharmacy or Health Dept. Verbalized acceptance and understanding.  Screening Tests Health Maintenance  Topic Date Due   COVID-19 Vaccine (4 - 2023-24 season) 11/23/2021   Zoster Vaccines- Shingrix (2  of 2) 10/25/2022   INFLUENZA VACCINE  10/24/2022   MAMMOGRAM  12/12/2022   Medicare Annual Wellness (AWV)  11/13/2023   DTaP/Tdap/Td (2 - Td or Tdap) 09/29/2024   Colonoscopy  01/03/2026   Pneumonia Vaccine 60+ Years old  Completed   DEXA SCAN  Completed   Hepatitis C Screening  Completed   HPV VACCINES  Aged Out    Health Maintenance  Health Maintenance Due  Topic Date Due   COVID-19 Vaccine (4 - 2023-24 season) 11/23/2021   Zoster Vaccines- Shingrix (2 of 2) 10/25/2022   INFLUENZA VACCINE  10/24/2022    Colorectal cancer screening: Type of screening: Colonoscopy. Completed 01/04/16. Repeat every 10 years  Mammogram status: Completed 12/11/21. Repeat every year  Bone Density status: Completed 12/11/21. Results reflect: Bone density results: OSTEOPENIA. Repeat every 2  years.  Additional Screening:  Hepatitis C Screening: Completed 05/08/17  Vision Screening: Recommended annual ophthalmology exams for early detection of glaucoma and other disorders of the eye. Is the patient up to date with their annual eye exam?  Yes  Who is the provider or what is the name of the office in which the patient attends annual eye exams? Dr Cherlynn Polo If pt is not established with a provider, would they like to be referred to a provider to establish care? No .   Dental Screening: Recommended annual dental exams for proper oral hygiene    Community Resource Referral / Chronic Care Management: CRR required this visit?  No   CCM required this visit?  No     Plan:     I have personally reviewed and noted the following in the patient's chart:   Medical and social history Use of alcohol, tobacco or illicit drugs  Current medications and supplements including opioid prescriptions. Patient is not currently taking opioid prescriptions. Functional ability and status Nutritional status Physical activity Advanced directives List of other physicians Hospitalizations, surgeries, and ER visits in previous 12 months Vitals Screenings to include cognitive, depression, and falls Referrals and appointments  In addition, I have reviewed and discussed with patient certain preventive protocols, quality metrics, and best practice recommendations. A written personalized care plan for preventive services as well as general preventive health recommendations were provided to patient.     Marzella Schlein, LPN   4/[id number]/[id number]   After Visit Summary: (MyChart) Due to this being a telephonic visit, the after visit summary with patients personalized plan was offered to patient via MyChart   Nurse Notes: none

## 2022-12-17 DIAGNOSIS — Z1231 Encounter for screening mammogram for malignant neoplasm of breast: Secondary | ICD-10-CM | POA: Diagnosis not present

## 2022-12-17 LAB — HM MAMMOGRAPHY

## 2023-01-13 ENCOUNTER — Encounter: Payer: Self-pay | Admitting: Urgent Care

## 2023-01-13 ENCOUNTER — Ambulatory Visit (INDEPENDENT_AMBULATORY_CARE_PROVIDER_SITE_OTHER): Payer: Medicare Other | Admitting: Urgent Care

## 2023-01-13 VITALS — BP 138/81 | HR 74 | Temp 97.9°F | Wt 142.0 lb

## 2023-01-13 DIAGNOSIS — N3000 Acute cystitis without hematuria: Secondary | ICD-10-CM

## 2023-01-13 DIAGNOSIS — R350 Frequency of micturition: Secondary | ICD-10-CM

## 2023-01-13 DIAGNOSIS — N8111 Cystocele, midline: Secondary | ICD-10-CM | POA: Diagnosis not present

## 2023-01-13 DIAGNOSIS — Z23 Encounter for immunization: Secondary | ICD-10-CM | POA: Diagnosis not present

## 2023-01-13 DIAGNOSIS — N765 Ulceration of vagina: Secondary | ICD-10-CM | POA: Diagnosis not present

## 2023-01-13 LAB — POC URINALSYSI DIPSTICK (AUTOMATED)
Bilirubin, UA: NEGATIVE
Blood, UA: POSITIVE
Glucose, UA: NEGATIVE
Ketones, UA: NEGATIVE
Nitrite, UA: NEGATIVE
Protein, UA: NEGATIVE
Spec Grav, UA: 1.01 (ref 1.010–1.025)
Urobilinogen, UA: 0.2 U/dL
pH, UA: 6.5 (ref 5.0–8.0)

## 2023-01-13 MED ORDER — CEPHALEXIN 500 MG PO CAPS: 500.0000 mg | ORAL_CAPSULE | Freq: Two times a day (BID) | ORAL | 0 refills | Status: AC

## 2023-01-13 NOTE — Patient Instructions (Signed)
Please call [facility] to schedule consultation. [facility] [address] [place], [place] [address] Phone: [phone]  Take your antibiotic twice daily x 7 days. Increase your water intake.  Apply topical vaseline to the area on your R labia that is irritated.  We will call with results of the swab if positive.

## 2023-01-13 NOTE — Progress Notes (Signed)
Established Patient Office Visit  Subjective:  Patient ID: Kelsey Bruce, female    DOB: Apr 02, 1950  Age: 72 y.o. MRN: [id number]  Chief Complaint  Patient presents with   Urinary Frequency    Urinary frequency that's been going on since Saturday. She has also been experiencing tingling. She wants to discuss med options for urinary frequency. Also wants a flu shot    [name] presents with concerns of possible UTI. States she has been having intermittent UTIs for some time and feels similar to prior sx. Current sx started 3 days ago. Frequency she states has been constant for years (since hysterectomy), but the discomfort with urination is new. She also reports intermittent itching and discomfort to a vaginal lesion on the R labia. It comes and goes. Denies hematuria, discharge, fever, flank pain. No N/V. No OTC meds tried.   Urinary Frequency  Associated symptoms include frequency.    Patient Active Problem List   Diagnosis Date Noted   History of total knee arthroplasty 12/07/2014   Osteoarthritis of left knee 12/24/2013   Endometrial cancer, grade I (HCC) 09/06/2013   Cerebral palsy (HCC) 08/05/2012   Osteoarthritis of multiple joints 07/06/2012   Seasonal allergic rhinitis 07/06/2012   Past Surgical History:  Procedure Laterality Date   COLONOSCOPY  09/2002; 12/2015   2004 hyperplastic; recall sent 2009 but pt apparently didn't respond.  12/2015: one sessile serrated polyp w/out cytologic atypia: recall 10 yrs per GI.   DEXA  08/2007; 11/14/15; 10/2017;10/2019   2009 T score -1.2.  2017 T score -1.6.  2019 T score -1.5. 10/2019 0-1.9.  11/2021 T score -1.7.   DILATION AND CURETTAGE OF UTERUS     LEG SURGERY Left 01/2014   "cleaned out cartilage"   ROBOTIC ASSISTED TOTAL HYSTERECTOMY WITH BILATERAL SALPINGO OOPHERECTOMY Bilateral 10/05/2013   Procedure: ROBOTIC ASSISTED TOTAL HYSTERECTOMY WITH BILATERAL SALPINGO OOPHORECTOMY WITH LYMPH NODE DISECTION;  Surgeon:  Adolphus Birchwood, MD;  Location: [facility];  Service: Gynecology;  Laterality: Bilateral;   TONSILLECTOMY     TOTAL KNEE ARTHROPLASTY Left 12/07/2014   Procedure: TOTAL KNEE ARTHROPLASTY;  Surgeon: Ranee Gosselin, MD;  Location: [facility];  Service: Orthopedics;  Laterality: Left;   TUBAL LIGATION     Social History   Tobacco Use   Smoking status: Never   Smokeless tobacco: Never  Vaping Use   Vaping status: Never Used  Substance Use Topics   Alcohol use: No    Comment: occasional   Drug use: No      ROS: as noted in HPI  Objective:     BP 138/81   Pulse 74   Temp 97.9 F (36.6 C) (Oral)   Wt 142 lb (64.4 kg)   SpO2 96%   BMI 28.68 kg/m  Wt Readings from Last 3 Encounters:  01/13/23 142 lb (64.4 kg)  11/13/22 143 lb (64.9 kg)  10/31/22 143 lb (64.9 kg)      Physical Exam Vitals and nursing note reviewed. Exam conducted with a chaperone present.  Constitutional:      General: She is not in acute distress.    Appearance: Normal appearance. She is not ill-appearing, toxic-appearing or diaphoretic.  HENT:     Head: Normocephalic and atraumatic.  Eyes:     General:        Right eye: No discharge.        Left eye: No discharge.     Extraocular Movements: Extraocular movements intact.     Pupils:  Pupils are equal, round, and reactive to light.  Cardiovascular:     Rate and Rhythm: Normal rate.  Pulmonary:     Effort: Pulmonary effort is normal. No respiratory distress.  Abdominal:     General: Abdomen is flat. There is no distension.     Palpations: Abdomen is soft.     Tenderness: There is no abdominal tenderness. There is no right CVA tenderness, left CVA tenderness, guarding or rebound.  Genitourinary:    Pubic Area: No rash or pubic lice.      Labia:        Right: Lesion (4 ulcerations surrounded by erythematous base to R labia) present. No rash, tenderness or injury.        Left: No rash, tenderness, lesion or injury.      Urethra: No prolapse, urethral pain,  urethral swelling or urethral lesion.     Vagina: Normal. No vaginal discharge or lesions.     Cervix: Normal. No cervical motion tenderness, discharge, friability or lesion.     Uterus: Normal.      Adnexa: Right adnexa normal and left adnexa normal.       Right: No mass, tenderness or fullness.         Left: No mass, tenderness or fullness.       Rectum: Normal.    Lymphadenopathy:     Lower Body: No right inguinal adenopathy. No left inguinal adenopathy.  Skin:    General: Skin is warm and dry.     Coloration: Skin is not jaundiced.     Findings: No bruising, erythema or rash.  Neurological:     Mental Status: She is alert and oriented to person, place, and time.      Results for orders placed or performed in visit on 01/13/23  POCT Urinalysis Dipstick (Automated)  Result Value Ref Range   Color, UA yellow    Clarity, UA cloudy    Glucose, UA Negative Negative   Bilirubin, UA negative    Ketones, UA negative    Spec Grav, UA 1.010 1.010 - 1.025   Blood, UA positive    pH, UA 6.5 5.0 - 8.0   Protein, UA Negative Negative   Urobilinogen, UA 0.2 0.2 or 1.0 E.U./dL   Nitrite, UA negative    Leukocytes, UA Large (3+) (A) Negative     The 10-year ASCVD risk score (Arnett DK, et al., 2019) is: 12.1%  Assessment & Plan:  Urinary frequency -     POCT Urinalysis Dipstick (Automated) -     Urine Culture -     Cephalexin; Take 1 capsule (500 mg total) by mouth 2 (two) times daily for 7 days.  Dispense: 14 capsule; Refill: 0 -     Ambulatory referral to Physical Therapy -     US RENAL Will obtain pre/post void residual as I suspect urinary retention likely the cause of her recurrent sx.    Acute cystitis without hematuria -     Cephalexin; Take 1 capsule (500 mg total) by mouth 2 (two) times daily for 7 days.  Dispense: 14 capsule; Refill: 0 -     Urine culture  UA dip in office suggestive of acute UTI. Start cephalexin BID x 7 days. Prior Cx results showing no resistant  patterns.   Vaginal ulceration -     Herpes simplex virus culture  Use topical vaseline at present time. Viral cx obtained.  Cystocele, midline -     Ambulatory referral to Physical Therapy -  US RENAL  Will refer to pelvic floor therapy. Pt to also obtain post-void residual. She is preferring workup be performed here rather than referral to gynecology.  Need for influenza vaccination -     Flu Vaccine Trivalent High Dose (Fluad)     Followup in Dec, sooner as needed.   Maretta Bees, PA

## 2023-01-14 LAB — URINE CULTURE
MICRO NUMBER:: 15621033
SPECIMEN QUALITY:: ADEQUATE

## 2023-01-16 ENCOUNTER — Telehealth: Payer: Self-pay | Admitting: Urgent Care

## 2023-01-16 DIAGNOSIS — B009 Herpesviral infection, unspecified: Secondary | ICD-10-CM

## 2023-01-16 LAB — HERPES SIMPLEX VIRUS CULTURE
MICRO NUMBER:: 15620631
SPECIMEN QUALITY:: ADEQUATE

## 2023-01-16 LAB — TIQ- AMBIGUOUS ORDER

## 2023-01-16 MED ORDER — VALACYCLOVIR HCL 1 G PO TABS: 1000.0000 mg | ORAL_TABLET | Freq: Two times a day (BID) | ORAL | 0 refills | Status: AC

## 2023-01-16 NOTE — Telephone Encounter (Signed)
Called pt regarding recent lab result. 2 identifiers obtained prior to discussing. HSV swab positive. 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 Will call in valtrex 1g BID x 10 days.

## 2023-01-23 ENCOUNTER — Ambulatory Visit: Payer: Medicare Other

## 2023-01-23 DIAGNOSIS — R350 Frequency of micturition: Secondary | ICD-10-CM | POA: Diagnosis not present

## 2023-01-24 ENCOUNTER — Telehealth: Payer: Self-pay | Admitting: Urgent Care

## 2023-01-24 DIAGNOSIS — R350 Frequency of micturition: Secondary | ICD-10-CM

## 2023-01-24 NOTE — Telephone Encounter (Signed)
Patient calls to inquire about another round of antibiotics for the UTI she had. She mentions that it has not worsened but feels about the same in regards to frequent urination. She denied another appointment because she says she had to get an ultrasound yesterday on her Kidneys and says she will have to come back in for an appointment for that. Please give the patient a call to advise on of request for medication can be approved. She is a [facility] patient, but saw [facility] last week.

## 2023-01-24 NOTE — Telephone Encounter (Signed)
Pt advised of provider recommendations, she would like to do a trial of medication but would like PCP input.

## 2023-01-24 NOTE — Telephone Encounter (Signed)
Vesicare or Oxybutynin would be good options. I can send in if she would like; if she want's Dr. Samul Dada blessing on the treatment plan, I will wait for his input. Thanks

## 2023-01-24 NOTE — Telephone Encounter (Signed)
Per pt's last urine culture, it was NEGATIVE for infection, therefore additional antibiotics are not indicated. I am awaiting the results of her pre/post void residual [place] completed yesterday. We could potentially do a short trial of a medicaiton to help with frequency, but this is not due to infection.

## 2023-01-25 NOTE — Telephone Encounter (Signed)
I agree, Vesicare or oxybutynin trial is a good plan

## 2023-01-27 MED ORDER — OXYBUTYNIN CHLORIDE ER 5 MG PO TB24
5.0000 mg | ORAL_TABLET | Freq: Every day | ORAL | 5 refills | Status: DC
Start: 2023-01-27 — End: 2023-12-17

## 2023-01-27 NOTE — Telephone Encounter (Signed)
Pt advised of new meds sent to pharmacy

## 2023-01-27 NOTE — Telephone Encounter (Signed)
Ditropan 5mg  XL every day called in for patient.

## 2023-01-27 NOTE — Telephone Encounter (Signed)
Please notify pt that Dr. Milinda Cave is in agreement that an anti-spasmodic for the bladder is a preferred treatment option. I have called in a medication for her to try. We will notify her once the bladder US has been read. Thanks

## 2023-01-27 NOTE — Telephone Encounter (Signed)
Left Vm for pt to return my call.  

## 2023-02-10 ENCOUNTER — Other Ambulatory Visit: Payer: Self-pay | Admitting: Family Medicine

## 2023-02-24 ENCOUNTER — Encounter: Payer: Self-pay | Admitting: Family Medicine

## 2023-02-24 ENCOUNTER — Ambulatory Visit (INDEPENDENT_AMBULATORY_CARE_PROVIDER_SITE_OTHER): Payer: Medicare Other | Admitting: Family Medicine

## 2023-02-24 VITALS — BP 132/82 | HR 61 | Wt 141.6 lb

## 2023-02-24 DIAGNOSIS — R531 Weakness: Secondary | ICD-10-CM | POA: Diagnosis not present

## 2023-02-24 DIAGNOSIS — R2681 Unsteadiness on feet: Secondary | ICD-10-CM

## 2023-02-24 DIAGNOSIS — R5383 Other fatigue: Secondary | ICD-10-CM

## 2023-02-24 DIAGNOSIS — Z791 Long term (current) use of non-steroidal anti-inflammatories (NSAID): Secondary | ICD-10-CM

## 2023-02-24 DIAGNOSIS — Z5181 Encounter for therapeutic drug level monitoring: Secondary | ICD-10-CM | POA: Diagnosis not present

## 2023-02-24 LAB — BASIC METABOLIC PANEL
BUN: 26 mg/dL — ABNORMAL HIGH (ref 6–23)
CO2: 27 meq/L (ref 19–32)
Calcium: 9.2 mg/dL (ref 8.4–10.5)
Chloride: 105 meq/L (ref 96–112)
Creatinine, Ser: 0.73 mg/dL (ref 0.40–1.20)
GFR: 82.7 mL/min (ref >60.00)
Glucose, Bld: 98 mg/dL (ref 70–99)
Potassium: 4.2 meq/L (ref 3.5–5.1)
Sodium: 140 meq/L (ref 135–145)

## 2023-02-24 MED ORDER — MELOXICAM 15 MG PO TABS: 15.0000 mg | ORAL_TABLET | Freq: Every day | ORAL | 1 refills | Status: DC

## 2023-02-24 NOTE — Progress Notes (Signed)
OFFICE VISIT  02/24/2023  CC:  Chief Complaint  Patient presents with   Medical Management of Chronic Issues    Pt is fasting.     Patient is a 72 y.o. female who presents for 12-month follow-up osteoarthritis and recurrent UTI.  INTERIM HX: She feels chronic fatigue, but otherwise okay. She has also felt like maybe the fatigue is worse after starting oxybutynin about a month ago. On a good note, she does feel like her daytime urgency and frequency is improved on this medication.  She still gets up 3-4 times a night to urinate.  Still feels unsteady when walking. Takes meloxicam 15 mg most days for her arthritic pain.  ROS as above, plus--> no fevers, no CP, no SOB, no wheezing, no cough, no dizziness, no HAs, no rashes, no melena/hematochezia.  No polyuria or polydipsia. No focal weakness, paresthesias, or tremors.  No acute vision or hearing abnormalities.  No dysuria or unusual/new urinary urgency or frequency.  No recent changes in lower legs. No n/v/d or abd pain.  No palpitations.    Past Medical History:  Diagnosis Date   Allergy    Borderline hyperlipidemia    Framingham CV risk 2017= 6%.   Cataract    Cerebral palsy (HCC)    Colon polyp, hyperplastic 2004; 12/2015   Recall 12/2025   Difficulty sleeping    Endometrial adenocarcinoma (HCC) 09/2013   Dr. Andrey Farmer: pt got vaginal brachytherapy via rad onc.  No sign of dz recurrence as of 02/2016 [facility]yn On[facility] f/u.  Next f/u with them is 6 mo.   Morton's neuroma 2012   Dr. Olevia Perches did injections in the past (left foot)   Osteoarthritis    LB, HIPs, knees (L knee end stage), hands   Osteopenia    DEXA 10/2017 was -1.5.  10/2019 T score -1.9.  11/2021 T score -1.7   Patellofemoral arthralgia of left knee 12/24/2013   Radiation 12/16/13, 12/22/13, 12/30/13, 01/06/14, 01/13/14   proximal vagina 30 gray   Right hip pain 2021   more radicular, like DDD etiology->[facility]   Rotator cuff tendonitis 04/21/2013   Seasonal allergic  rhinitis     Past Surgical History:  Procedure Laterality Date   COLONOSCOPY  09/2002; 12/2015   2004 hyperplastic; recall sent 2009 but pt apparently didn't respond.  12/2015: one sessile serrated polyp w/out cytologic atypia: recall 10 yrs per GI.   DEXA  08/2007; 11/14/15; 10/2017;10/2019   2009 T score -1.2.  2017 T score -1.6.  2019 T score -1.5. 10/2019 0-1.9.  11/2021 T score -1.7.   DILATION AND CURETTAGE OF UTERUS     LEG SURGERY Left 01/2014   "cleaned out cartilage"   ROBOTIC ASSISTED TOTAL HYSTERECTOMY WITH BILATERAL SALPINGO OOPHERECTOMY Bilateral 10/05/2013   Procedure: ROBOTIC ASSISTED TOTAL HYSTERECTOMY WITH BILATERAL SALPINGO OOPHORECTOMY WITH LYMPH NODE DISECTION;  Surgeon: Adolphus Birchwood, MD;  Location: [facility];  Service: Gynecology;  Laterality: Bilateral;   TONSILLECTOMY     TOTAL KNEE ARTHROPLASTY Left 12/07/2014   Procedure: TOTAL KNEE ARTHROPLASTY;  Surgeon: Ranee Gosselin, MD;  Location: [facility];  Service: Orthopedics;  Laterality: Left;   TUBAL LIGATION      Outpatient Medications Prior to Visit  Medication Sig Dispense Refill   beta carotene w/minerals (OCUVITE) tablet Take 1 tablet by mouth every morning. Reported on 06/01/2015     CALCIUM PO Take 1 tablet by mouth daily.     cetirizine (ZYRTEC) 10 MG tablet Take 10 mg by mouth daily  as needed for allergies. Reported on 06/01/2015     Cholecalciferol (VITAMIN D PO) Take 1 tablet by mouth daily.     CRANBERRY PO Take 4,200 mg by mouth. Take 2 capsules daily.     cyclobenzaprine (FLEXERIL) 10 MG tablet 1 tab po bid prn for musculoskeletal pain 30 tablet 5   diclofenac Sodium (VOLTAREN) 1 % GEL Apply 2 g topically 4 (four) times daily. 100 g 3   docusate sodium (COLACE) 100 MG capsule Take 100 mg by mouth daily as needed for mild constipation. Reported on 06/01/2015     fluticasone (FLONASE) 50 MCG/ACT nasal spray Place 2 sprays into both nostrils daily. 16 g 6   oxybutynin (DITROPAN-XL) 5 MG 24 hr tablet Take 1 tablet (5 mg  total) by mouth at bedtime. 30 tablet 5   pantoprazole (PROTONIX) 40 MG tablet Take 1 tablet (40 mg total) by mouth daily. 90 tablet 1   polyethylene glycol (MIRALAX / GLYCOLAX) packet Take 17 g by mouth daily as needed for mild constipation. Reported on 06/01/2015     Probiotic Product (PROBIOTIC PO) Take 1 tablet by mouth every morning. Reported on 06/01/2015     vitamin B-12 (CYANOCOBALAMIN) 1000 MCG tablet Take 1,000 mcg by mouth daily.     meloxicam (MOBIC) 15 MG tablet Take 1 tablet (15 mg total) by mouth daily. 90 tablet 1   No facility-administered medications prior to visit.    Allergies  Allergen Reactions   Aspirin Nausea Only    High dose only   Seasonal Ic [Cholestatin]     Review of Systems As per HPI  PE:    02/24/2023    8:21 AM 01/13/2023   11:37 AM 11/13/2022   10:28 AM  Vitals with BMI  Weight 141 lbs 10 oz 142 lbs 143 lbs  BMI   28.87  Systolic 147 138   Diastolic 84 81   Pulse 61 74    Physical Exam  Gen: Alert, well appearing.  Patient is oriented to person, place, time, and situation. AFFECT: pleasant, lucid thought and speech. Walks a bit unsteadily and slowly.  Needs a hand to get up on exam table. CV: RRR, no m/r/g.   LUNGS: CTA bilat, nonlabored resps, good aeration in all lung fields.   LABS:  Last CBC Lab Results  Component Value Date   WBC 5.3 08/23/2022   HGB 14.3 08/23/2022   HCT 43.6 08/23/2022   MCV 90.1 08/23/2022   MCH 30.3 08/17/2021   RDW 13.4 08/23/2022   PLT 273.0 08/23/2022   Last metabolic panel Lab Results  Component Value Date   GLUCOSE 93 08/23/2022   NA 140 08/23/2022   K 4.2 08/23/2022   CL 103 08/23/2022   CO2 30 08/23/2022   BUN 20 08/23/2022   CREATININE 0.79 08/23/2022   GFR 75.49 08/23/2022   CALCIUM 9.2 08/23/2022   PHOS 2.9 07/29/2012   PROT 6.9 08/23/2022   ALBUMIN 4.2 08/23/2022   BILITOT 0.5 08/23/2022   ALKPHOS 60 08/23/2022   AST 18 08/23/2022   ALT 14 08/23/2022   ANIONGAP 7 12/09/2014    Last lipids Lab Results  Component Value Date   CHOL 184 08/23/2022   HDL 48.30 08/23/2022   LDLCALC 118 (H) 08/23/2022   TRIG 86.0 08/23/2022   CHOLHDL 4 08/23/2022   Last hemoglobin A1c Lab Results  Component Value Date   HGBA1C 5.8 08/09/2019   Last thyroid functions Lab Results  Component Value Date   TSH  2.66 08/15/2021   IMPRESSION AND PLAN:  #1 osteoarthritis, multiple sites. On daily NSAID--meloxicam 15 mg a day. Basic metabolic panel today.  2.  Recurrent UTI with significant component of overactive bladder. No signs of UTI at this time.  Overactive bladder symptoms improved with Ditropan XL 5 mg a day.  #3 fatigue.  Not sure if related to recent start of Ditropan. She will hold this medication for a few days and see how she feels.  No additional workup for her fatigue at this time but we will reevaluate this in 2 weeks.  An After Visit Summary was printed and given to the patient.  FOLLOW UP: Return in about 2 weeks (around 03/10/2023) for f/u fatigue. Next CPE June 2025 Signed:  Santiago Bumpers, MD           02/24/2023

## 2023-02-24 NOTE — Patient Instructions (Signed)
Try stopping oxybutynin for 3 days and see if your fatigue gets better.

## 2023-03-10 DIAGNOSIS — Z9181 History of falling: Secondary | ICD-10-CM | POA: Diagnosis not present

## 2023-03-10 DIAGNOSIS — M545 Low back pain, unspecified: Secondary | ICD-10-CM | POA: Diagnosis not present

## 2023-03-10 DIAGNOSIS — R2681 Unsteadiness on feet: Secondary | ICD-10-CM | POA: Diagnosis not present

## 2023-03-11 ENCOUNTER — Telehealth: Payer: Self-pay | Admitting: Family Medicine

## 2023-03-11 ENCOUNTER — Encounter: Payer: Self-pay | Admitting: Family Medicine

## 2023-03-11 ENCOUNTER — Ambulatory Visit: Payer: Medicare Other | Admitting: Family Medicine

## 2023-03-11 VITALS — BP 138/82 | HR 62 | Wt 140.0 lb

## 2023-03-11 DIAGNOSIS — T50905D Adverse effect of unspecified drugs, medicaments and biological substances, subsequent encounter: Secondary | ICD-10-CM

## 2023-03-11 DIAGNOSIS — B3731 Acute candidiasis of vulva and vagina: Secondary | ICD-10-CM | POA: Diagnosis not present

## 2023-03-11 DIAGNOSIS — R5383 Other fatigue: Secondary | ICD-10-CM | POA: Diagnosis not present

## 2023-03-11 MED ORDER — FLUCONAZOLE 150 MG PO TABS: ORAL_TABLET | ORAL | 0 refills | Status: DC

## 2023-03-11 NOTE — Progress Notes (Signed)
OFFICE VISIT  03/11/2023  CC:  Chief Complaint  Patient presents with   Fatigue    F/U. Fatigue has improved.     Patient is a 72 y.o. female who presents for 2-week follow-up fatigue. A/P as of last visit: "1 osteoarthritis, multiple sites. On daily NSAID--meloxicam 15 mg a day. Basic metabolic panel today.   2.  Recurrent UTI with significant component of overactive bladder. No signs of UTI at this time.  Overactive bladder symptoms improved with Ditropan XL 5 mg a day.   #3 fatigue.  Not sure if related to recent start of Ditropan. She will hold this medication for a few days and see how she feels.   No additional workup for her fatigue at this time but we will reevaluate this in 2 weeks."  INTERIM HX: Kelsey Bruce feels much better.  She has started taking the oxybutynin and average of every other night and feels like this helps sufficiently for her needs.  In the recent past she had a UTI and was treated with antibiotics.  She had subsequent vaginal itching/discharge and treated for yeast infection but she has some residual itching still.    Past Medical History:  Diagnosis Date   Allergy    Borderline hyperlipidemia    Framingham CV risk 2017= 6%.   Cataract    Cerebral palsy (HCC)    Colon polyp, hyperplastic 2004; 12/2015   Recall 12/2025   Difficulty sleeping    Endometrial adenocarcinoma (HCC) 09/2013   Dr. Andrey Farmer: pt got vaginal brachytherapy via rad onc.  No sign of dz recurrence as of 02/2016 [facility]yn On[facility] f/u.  Next f/u with them is 6 mo.   Morton's neuroma 2012   Dr. Olevia Perches did injections in the past (left foot)   Osteoarthritis    LB, HIPs, knees (L knee end stage), hands   Osteopenia    DEXA 10/2017 was -1.5.  10/2019 T score -1.9.  11/2021 T score -1.7   Patellofemoral arthralgia of left knee 12/24/2013   Radiation 12/16/13, 12/22/13, 12/30/13, 01/06/14, 01/13/14   proximal vagina 30 gray   Right hip pain 2021   more radicular, like DDD etiology->[facility]    Rotator cuff tendonitis 04/21/2013   Seasonal allergic rhinitis     Past Surgical History:  Procedure Laterality Date   COLONOSCOPY  09/2002; 12/2015   2004 hyperplastic; recall sent 2009 but pt apparently didn't respond.  12/2015: one sessile serrated polyp w/out cytologic atypia: recall 10 yrs per GI.   DEXA  08/2007; 11/14/15; 10/2017;10/2019   2009 T score -1.2.  2017 T score -1.6.  2019 T score -1.5. 10/2019 0-1.9.  11/2021 T score -1.7.   DILATION AND CURETTAGE OF UTERUS     LEG SURGERY Left 01/2014   "cleaned out cartilage"   ROBOTIC ASSISTED TOTAL HYSTERECTOMY WITH BILATERAL SALPINGO OOPHERECTOMY Bilateral 10/05/2013   Procedure: ROBOTIC ASSISTED TOTAL HYSTERECTOMY WITH BILATERAL SALPINGO OOPHORECTOMY WITH LYMPH NODE DISECTION;  Surgeon: Adolphus Birchwood, MD;  Location: [facility];  Service: Gynecology;  Laterality: Bilateral;   TONSILLECTOMY     TOTAL KNEE ARTHROPLASTY Left 12/07/2014   Procedure: TOTAL KNEE ARTHROPLASTY;  Surgeon: Ranee Gosselin, MD;  Location: [facility];  Service: Orthopedics;  Laterality: Left;   TUBAL LIGATION      Outpatient Medications Prior to Visit  Medication Sig Dispense Refill   beta carotene w/minerals (OCUVITE) tablet Take 1 tablet by mouth every morning. Reported on 06/01/2015     CALCIUM PO Take 1 tablet by mouth  daily.     cetirizine (ZYRTEC) 10 MG tablet Take 10 mg by mouth daily as needed for allergies. Reported on 06/01/2015     Cholecalciferol (VITAMIN D PO) Take 1 tablet by mouth daily.     CRANBERRY PO Take 4,200 mg by mouth. Take 2 capsules daily.     cyclobenzaprine (FLEXERIL) 10 MG tablet 1 tab po bid prn for musculoskeletal pain 30 tablet 5   diclofenac Sodium (VOLTAREN) 1 % GEL Apply 2 g topically 4 (four) times daily. 100 g 3   docusate sodium (COLACE) 100 MG capsule Take 100 mg by mouth daily as needed for mild constipation. Reported on 06/01/2015     fluticasone (FLONASE) 50 MCG/ACT nasal spray Place 2 sprays into both nostrils daily. 16 g 6    meloxicam (MOBIC) 15 MG tablet Take 1 tablet (15 mg total) by mouth daily. 90 tablet 1   oxybutynin (DITROPAN-XL) 5 MG 24 hr tablet Take 1 tablet (5 mg total) by mouth at bedtime. 30 tablet 5   pantoprazole (PROTONIX) 40 MG tablet Take 1 tablet (40 mg total) by mouth daily. 90 tablet 1   polyethylene glycol (MIRALAX / GLYCOLAX) packet Take 17 g by mouth daily as needed for mild constipation. Reported on 06/01/2015     Probiotic Product (PROBIOTIC PO) Take 1 tablet by mouth every morning. Reported on 06/01/2015     vitamin B-12 (CYANOCOBALAMIN) 1000 MCG tablet Take 1,000 mcg by mouth daily.     No facility-administered medications prior to visit.    Allergies  Allergen Reactions   Aspirin Nausea Only    High dose only   Seasonal Ic [Cholestatin]     Review of Systems As per HPI  PE:    03/11/2023   10:05 AM 03/11/2023    9:57 AM 02/24/2023    9:26 AM  Vitals with BMI  Weight  140 lbs   Systolic 138 148 387  Diastolic 82 81 82  Pulse  62      Physical Exam  Gen: Alert, well appearing.  Patient is oriented to person, place, time, and situation. AFFECT: pleasant, lucid thought and speech.   LABS:  Last CBC Lab Results  Component Value Date   WBC 5.3 08/23/2022   HGB 14.3 08/23/2022   HCT 43.6 08/23/2022   MCV 90.1 08/23/2022   MCH 30.3 08/17/2021   RDW 13.4 08/23/2022   PLT 273.0 08/23/2022   Last metabolic panel Lab Results  Component Value Date   GLUCOSE 98 02/24/2023   NA 140 02/24/2023   K 4.2 02/24/2023   CL 105 02/24/2023   CO2 27 02/24/2023   BUN 26 (H) 02/24/2023   CREATININE 0.73 02/24/2023   GFR 82.70 02/24/2023   CALCIUM 9.2 02/24/2023   PHOS 2.9 07/29/2012   PROT 6.9 08/23/2022   ALBUMIN 4.2 08/23/2022   BILITOT 0.5 08/23/2022   ALKPHOS 60 08/23/2022   AST 18 08/23/2022   ALT 14 08/23/2022   ANIONGAP 7 12/09/2014   Last hemoglobin A1c Lab Results  Component Value Date   HGBA1C 5.8 08/09/2019   Last thyroid functions Lab Results   Component Value Date   TSH 2.66 08/15/2021   IMPRESSION AND PLAN:  #1 fatigue.  Suspect this is a side effect of her oxybutynin.  She has adjusted her dosing to minimize the symptom yet still treat her voiding symptoms adequately.  2.  Yeast vaginitis. Diflucan 150 mg/day x 1 day prescribed today.  An After Visit Summary was printed and  given to the patient.  FOLLOW UP: Return in about 6 months (around 09/09/2023) for annual CPE (fasting).  Signed:  Santiago Bumpers, MD           03/11/2023

## 2023-03-11 NOTE — Telephone Encounter (Signed)
Placed in PCP office.

## 2023-03-11 NOTE — Telephone Encounter (Signed)
[facility] faxed, Initial Evaluation  to be filled out by provider. Patient requested to send it back via Fax within 7-days. Document is located in providers tray at front office.Please advise at Mobile [phone] (mobile)

## 2023-03-24 DIAGNOSIS — R2681 Unsteadiness on feet: Secondary | ICD-10-CM | POA: Diagnosis not present

## 2023-03-24 DIAGNOSIS — Z9181 History of falling: Secondary | ICD-10-CM | POA: Diagnosis not present

## 2023-03-24 DIAGNOSIS — M545 Low back pain, unspecified: Secondary | ICD-10-CM | POA: Diagnosis not present

## 2023-04-02 DIAGNOSIS — M545 Low back pain, unspecified: Secondary | ICD-10-CM | POA: Diagnosis not present

## 2023-04-02 DIAGNOSIS — Z9181 History of falling: Secondary | ICD-10-CM | POA: Diagnosis not present

## 2023-04-02 DIAGNOSIS — R2681 Unsteadiness on feet: Secondary | ICD-10-CM | POA: Diagnosis not present

## 2023-04-09 ENCOUNTER — Ambulatory Visit (INDEPENDENT_AMBULATORY_CARE_PROVIDER_SITE_OTHER): Payer: Medicare Other | Admitting: Family Medicine

## 2023-04-09 ENCOUNTER — Encounter: Payer: Self-pay | Admitting: Family Medicine

## 2023-04-09 VITALS — BP 135/85 | HR 75 | Temp 97.8°F | Wt 140.0 lb

## 2023-04-09 DIAGNOSIS — J00 Acute nasopharyngitis [common cold]: Secondary | ICD-10-CM | POA: Diagnosis not present

## 2023-04-09 DIAGNOSIS — J029 Acute pharyngitis, unspecified: Secondary | ICD-10-CM

## 2023-04-09 LAB — POCT RAPID STREP A (OFFICE): Rapid Strep A Screen: NEGATIVE

## 2023-04-09 NOTE — Progress Notes (Signed)
 OFFICE VISIT  04/09/2023  CC:  Chief Complaint  Patient presents with   Sore Throat    About a week; sore throat, HA, fatigue, some congestion. Pt has taken tylenol , found little relief. 99.6 temp.     Patient is a 73 y.o. female who presents for sore throat.  HPI: Approx 1 wk ST, HA, some sniffles/congestion, minimal cough. Signif inc in chronic fatigue and general osteoarthritic pain.   No fevers, no SOB, no LE swelling, no CP. No n/v/d.  Past Medical History:  Diagnosis Date   Allergy    Borderline hyperlipidemia    Framingham CV risk 2017= 6%.   Cataract    Cerebral palsy (HCC)    Colon polyp, hyperplastic 2004; 12/2015   Recall 12/2025   Difficulty sleeping    Endometrial adenocarcinoma (HCC) 09/2013   Dr. Pearly Bound: pt got vaginal brachytherapy via rad onc.  No sign of dz recurrence as of 02/2016 [facility]yn Onc f/u.  Next f/u with them is 6 mo.   Morton's neuroma 2012   Dr. Barnet Lias did injections in the past (left foot)   Osteoarthritis    LB, HIPs, knees (L knee end stage), hands   Osteopenia    DEXA 10/2017 was -1.5.  10/2019 T score -1.9.  11/2021 T score -1.7   Patellofemoral arthralgia of left knee 12/24/2013   Radiation 12/16/13, 12/22/13, 12/30/13, 01/06/14, 01/13/14   proximal vagina 30 gray   Right hip pain 2021   more radicular, like DDD etiology->[facility]   Rotator cuff tendonitis 04/21/2013   Seasonal allergic rhinitis     Past Surgical History:  Procedure Laterality Date   COLONOSCOPY  09/2002; 12/2015   2004 hyperplastic; recall sent 2009 but pt apparently didn't respond.  12/2015: one sessile serrated polyp w/out cytologic atypia: recall 10 yrs per GI.   DEXA  08/2007; 11/14/15; 10/2017;10/2019   2009 T score -1.2.  2017 T score -1.6.  2019 T score -1.5. 10/2019 0-1.9.  11/2021 T score -1.7.   DILATION AND CURETTAGE OF UTERUS     LEG SURGERY Left 01/2014   "cleaned out cartilage"   ROBOTIC ASSISTED TOTAL HYSTERECTOMY WITH BILATERAL SALPINGO OOPHERECTOMY  Bilateral 10/05/2013   Procedure: ROBOTIC ASSISTED TOTAL HYSTERECTOMY WITH BILATERAL SALPINGO OOPHORECTOMY WITH LYMPH NODE DISECTION;  Surgeon: Alphonso Aschoff, MD;  Location: [facility];  Service: Gynecology;  Laterality: Bilateral;   TONSILLECTOMY     TOTAL KNEE ARTHROPLASTY Left 12/07/2014   Procedure: TOTAL KNEE ARTHROPLASTY;  Surgeon: Hazle Lites, MD;  Location: [facility];  Service: Orthopedics;  Laterality: Left;   TUBAL LIGATION      Outpatient Medications Prior to Visit  Medication Sig Dispense Refill   beta carotene w/minerals (OCUVITE) tablet Take 1 tablet by mouth every morning. Reported on 06/01/2015     CALCIUM PO Take 1 tablet by mouth daily.     cetirizine (ZYRTEC) 10 MG tablet Take 10 mg by mouth daily as needed for allergies. Reported on 06/01/2015     Cholecalciferol (VITAMIN D PO) Take 1 tablet by mouth daily.     CRANBERRY PO Take 4,200 mg by mouth. Take 2 capsules daily.     cyclobenzaprine  (FLEXERIL ) 10 MG tablet 1 tab po bid prn for musculoskeletal pain 30 tablet 5   diclofenac  Sodium (VOLTAREN ) 1 % GEL Apply 2 g topically 4 (four) times daily. 100 g 3   docusate sodium (COLACE) 100 MG capsule Take 100 mg by mouth daily as needed for mild constipation. Reported on 06/01/2015  fluconazole  (DIFLUCAN ) 150 MG tablet 1 tab p.o. daily x 1 dose 1 tablet 0   fluticasone  (FLONASE ) 50 MCG/ACT nasal spray Place 2 sprays into both nostrils daily. 16 g 6   meloxicam  (MOBIC ) 15 MG tablet Take 1 tablet (15 mg total) by mouth daily. 90 tablet 1   oxybutynin  (DITROPAN -XL) 5 MG 24 hr tablet Take 1 tablet (5 mg total) by mouth at bedtime. 30 tablet 5   pantoprazole  (PROTONIX ) 40 MG tablet Take 1 tablet (40 mg total) by mouth daily. 90 tablet 1   polyethylene glycol (MIRALAX  / GLYCOLAX ) packet Take 17 g by mouth daily as needed for mild constipation. Reported on 06/01/2015     Probiotic Product (PROBIOTIC PO) Take 1 tablet by mouth every morning. Reported on 06/01/2015     vitamin B-12 (CYANOCOBALAMIN )  1000 MCG tablet Take 1,000 mcg by mouth daily.     No facility-administered medications prior to visit.    Allergies  Allergen Reactions   Aspirin Nausea Only    High dose only   Seasonal Ic [Cholestatin]     Review of Systems  As per HPI  PE:    04/09/2023    3:36 PM 03/11/2023   10:05 AM 03/11/2023    9:57 AM  Vitals with BMI  Weight 140 lbs  140 lbs  Systolic 135 138 161  Diastolic 85 82 81  Pulse 75  62     Physical Exam  VS: noted--normal. Gen: alert, NAD, NONTOXIC APPEARING. HEENT: eyes without injection, drainage, or swelling.  Ears: EACs clear, TMs with normal light reflex and landmarks.  Nose: Clear rhinorrhea, with some dried, crusty exudate adherent to pink injected mucosa.  No purulent d/c or swollen turbinates.  No paranasal sinus TTP.  No facial swelling.  Throat and mouth without focal lesion.  No pharyngial swelling, erythema, or exudate.   Neck: supple, no LAD.   LUNGS: CTA bilat, nonlabored resps.   CV: RRR, no m/r/g. EXT: no c/c/e SKIN: no rash   LABS:  Last metabolic panel Lab Results  Component Value Date   GLUCOSE 98 02/24/2023   NA 140 02/24/2023   K 4.2 02/24/2023   CL 105 02/24/2023   CO2 27 02/24/2023   BUN 26 (H) 02/24/2023   CREATININE 0.73 02/24/2023   GFR 82.70 02/24/2023   CALCIUM 9.2 02/24/2023   PHOS 2.9 07/29/2012   PROT 6.9 08/23/2022   ALBUMIN 4.2 08/23/2022   BILITOT 0.5 08/23/2022   ALKPHOS 60 08/23/2022   AST 18 08/23/2022   ALT 14 08/23/2022   ANIONGAP 7 12/09/2014   IMPRESSION AND PLAN:  URI, suspect viral etiology. Rapid strep today is NEGATIVE. Throat started to feel better the last 24h.  Hopefully she is starting the recovery phase. Reassured.  No rx's today.  An After Visit Summary was printed and given to the patient.  FOLLOW UP: No follow-ups on file.  Signed:  Arletha Lady, MD           04/09/2023

## 2023-05-26 ENCOUNTER — Ambulatory Visit: Payer: Self-pay | Admitting: Family Medicine

## 2023-05-26 NOTE — Telephone Encounter (Signed)
 Called pt back no answer left message

## 2023-05-26 NOTE — Telephone Encounter (Signed)
 Pt scheduled with provider tomorrow here

## 2023-05-26 NOTE — Telephone Encounter (Addendum)
 This RN made 3rd attempt to reach pt, no answer, LVM. Routing to clinic for follow up.  This RN made 2nd attempt to reach pt, no answer, LVM.

## 2023-05-26 NOTE — Telephone Encounter (Signed)
 Copied from CRM [id number]. Topic: Clinical - Medication Question >> May 26, 2023 11:06 AM Denese Killings wrote: Reason for CRM: Patient is requesting cough or sinus medicine. She thinks she has a sinus infection.

## 2023-05-27 ENCOUNTER — Ambulatory Visit: Admitting: Urgent Care

## 2023-05-27 ENCOUNTER — Encounter: Payer: Self-pay | Admitting: Urgent Care

## 2023-05-27 VITALS — BP 132/84 | HR 85 | Temp 97.7°F | Wt 139.8 lb

## 2023-05-27 DIAGNOSIS — J209 Acute bronchitis, unspecified: Secondary | ICD-10-CM | POA: Diagnosis not present

## 2023-05-27 DIAGNOSIS — J301 Allergic rhinitis due to pollen: Secondary | ICD-10-CM | POA: Diagnosis not present

## 2023-05-27 DIAGNOSIS — J050 Acute obstructive laryngitis [croup]: Secondary | ICD-10-CM | POA: Diagnosis not present

## 2023-05-27 LAB — POCT INFLUENZA A/B
Influenza A, POC: NEGATIVE
Influenza B, POC: NEGATIVE

## 2023-05-27 LAB — POCT RAPID STREP A (OFFICE): Rapid Strep A Screen: NEGATIVE

## 2023-05-27 MED ORDER — QVAR REDIHALER 40 MCG/ACT IN AERB: 1.0000 | INHALATION_SPRAY | Freq: Two times a day (BID) | RESPIRATORY_TRACT | 0 refills | Status: AC

## 2023-05-27 MED ORDER — MONTELUKAST SODIUM 10 MG PO TABS: 10.0000 mg | ORAL_TABLET | Freq: Every day | ORAL | 0 refills | Status: AC

## 2023-05-27 MED ORDER — FLUTICASONE PROPIONATE HFA 44 MCG/ACT IN AERO: 2.0000 | INHALATION_SPRAY | Freq: Two times a day (BID) | RESPIRATORY_TRACT | 0 refills | Status: DC

## 2023-05-27 MED ORDER — FLUTICASONE PROPIONATE 50 MCG/ACT NA SUSP: 2.0000 | Freq: Every day | NASAL | 6 refills | Status: AC

## 2023-05-27 NOTE — Telephone Encounter (Signed)
 Copied from CRM [id number]. Topic: Clinical - Prescription Issue >> May 27, 2023  2:49 PM Elizebeth Brooking wrote: Reason for CRM: Patient called in stated that the Pharmacy stated that she needs a prior auth for her prescription fluticasone (FLOVENT HFA) 44 MCG/ACT inhaler

## 2023-05-27 NOTE — Patient Instructions (Signed)
 I have refilled your flonase. Use 1 spray twice daily as needed for runny nose or sneezing.  Please start using the Flovent inhaler. 1-2 puffs twice daily. MUST RINSE MOUTH OUT after each use to prevent thrush. Please also take montelukast nightly.  If you do not have resolution to your current symptoms within the next 5-7 days, please send a message on Mychart for possible antibiotics at that time.  Keep your appointment 09/09/23 for annual physical.

## 2023-05-27 NOTE — Progress Notes (Signed)
 Established Patient Office Visit  Subjective:  Patient ID: Kelsey Bruce, Kelsey    DOB: 1950-12-25  Age: 73 y.o. MRN: [id number]  Chief Complaint  Patient presents with   Cough    Pt was seen in Jan for cough and sore throat and feels like she has not gotten better. She is having cough, headache and sore throat. Covid test negative as of Sunday.     HPI  Discussed the use of AI scribe software for clinical note transcription with the patient, who gave verbal consent to proceed.  History of Present Illness   Kelsey Bruce is a 73 year old Kelsey who presents with a persistent cough and sore throat.  She initially experienced similar symptoms, including a cough, headache, and sore throat, which persisted for a week before her visit on January 15th. A throat swab at that time was negative, and she was advised to take over-the-counter medications.  Currently, she has a sore throat that is intermittent, with the latest onset five days ago. The sore throat is stable and not worsening. No dysphagia or sensation of food impaction is reported.  She describes a persistent cough causing chest discomfort, characterized as a burning sensation, without tenderness on palpation. The cough is non-productive, and she experiences no dyspnea, although she notes limited ability to move quickly. The cough does not exacerbate when supine.  She is taking Tylenol, Mucinex, avobitacin, and cough syrup for symptom management. Additionally, she uses 'low mass' allergy medication during spring and summer for seasonal allergies.  No ear pressure, dyspnea, dysphagia, or exposure to sick contacts. No current seasonal allergies.       Patient Active Problem List   Diagnosis Date Noted   History of total knee arthroplasty 12/07/2014   Osteoarthritis of left knee 12/24/2013   Endometrial cancer, grade I (HCC) 09/06/2013   Cerebral palsy (HCC) 08/05/2012   Osteoarthritis of multiple joints  07/06/2012   Seasonal allergic rhinitis 07/06/2012   Past Medical History:  Diagnosis Date   Allergy    Borderline hyperlipidemia    Framingham CV risk 2017= 6%.   Cataract    Cerebral palsy (HCC)    Colon polyp, hyperplastic 2004; 12/2015   Recall 12/2025   Difficulty sleeping    Endometrial adenocarcinoma (HCC) 09/2013   Dr. Andrey Farmer: pt got vaginal brachytherapy via rad onc.  No sign of dz recurrence as of 02/2016 Gyn Onc f/u.  Next f/u with them is 6 mo.   Morton's neuroma 2012   Dr. Olevia Perches did injections in the past (left foot)   Osteoarthritis    LB, HIPs, knees (L knee end stage), hands   Osteopenia    DEXA 10/2017 was -1.5.  10/2019 T score -1.9.  11/2021 T score -1.7   Patellofemoral arthralgia of left knee 12/24/2013   Radiation 12/16/13, 12/22/13, 12/30/13, 01/06/14, 01/13/14   proximal vagina 30 gray   Right hip pain 2021   more radicular, like DDD etiology->PT, [facility]   Rotator cuff tendonitis 04/21/2013   Seasonal allergic rhinitis    Past Surgical History:  Procedure Laterality Date   COLONOSCOPY  09/2002; 12/2015   2004 hyperplastic; recall sent 2009 but pt apparently didn't respond.  12/2015: one sessile serrated polyp w/out cytologic atypia: recall 10 yrs per GI.   DEXA  08/2007; 11/14/15; 10/2017;10/2019   2009 T score -1.2.  2017 T score -1.6.  2019 T score -1.5. 10/2019 0-1.9.  11/2021 T score -1.7.   DILATION AND CURETTAGE OF  UTERUS     LEG SURGERY Left 01/2014   "cleaned out cartilage"   ROBOTIC ASSISTED TOTAL HYSTERECTOMY WITH BILATERAL SALPINGO OOPHERECTOMY Bilateral 10/05/2013   Procedure: ROBOTIC ASSISTED TOTAL HYSTERECTOMY WITH BILATERAL SALPINGO OOPHORECTOMY WITH LYMPH NODE DISECTION;  Surgeon: Adolphus Birchwood, MD;  Location: [facility];  Service: Gynecology;  Laterality: Bilateral;   TONSILLECTOMY     TOTAL KNEE ARTHROPLASTY Left 12/07/2014   Procedure: TOTAL KNEE ARTHROPLASTY;  Surgeon: Ranee Gosselin, MD;  Location: [facility];  Service: Orthopedics;  Laterality:  Left;   TUBAL LIGATION     Social History   Tobacco Use   Smoking status: Never   Smokeless tobacco: Never  Vaping Use   Vaping status: Never Used  Substance Use Topics   Alcohol use: No    Comment: occasional   Drug use: No      ROS: as noted in HPI  Objective:     BP 132/84   Pulse 85   Temp 97.7 F (36.5 C) (Oral)   Wt 139 lb 12.8 oz (63.4 kg)   SpO2 95%   BMI 28.24 kg/m  BP Readings from Last 3 Encounters:  05/27/23 132/84  04/09/23 135/85  03/11/23 138/82   Wt Readings from Last 3 Encounters:  05/27/23 139 lb 12.8 oz (63.4 kg)  04/09/23 140 lb (63.5 kg)  03/11/23 140 lb (63.5 kg)      Physical Exam Vitals and nursing note reviewed.  Constitutional:      General: She is not in acute distress.    Appearance: Normal appearance. She is not ill-appearing, toxic-appearing or diaphoretic.  HENT:     Head: Normocephalic and atraumatic.     Right Ear: Ear canal and external ear normal. No drainage, swelling or tenderness. No middle ear effusion. There is no impacted cerumen. Tympanic membrane is not injected, scarred, perforated or erythematous.     Left Ear: Ear canal and external ear normal. No drainage, swelling or tenderness.  No middle ear effusion. There is no impacted cerumen. Tympanic membrane is not injected, scarred, perforated or erythematous.     Nose: Congestion and rhinorrhea present. Rhinorrhea is clear.     Right Turbinates: Not enlarged or swollen.     Left Turbinates: Not enlarged or swollen.     Right Sinus: No maxillary sinus tenderness or frontal sinus tenderness.     Left Sinus: No maxillary sinus tenderness or frontal sinus tenderness.     Mouth/Throat:     Lips: Pink.     Mouth: Mucous membranes are moist.     Pharynx: Oropharynx is clear. Uvula midline. No pharyngeal swelling, oropharyngeal exudate, posterior oropharyngeal erythema, uvula swelling or postnasal drip.  Eyes:     General: No scleral icterus.       Right eye: No  discharge.        Left eye: No discharge.  Cardiovascular:     Rate and Rhythm: Normal rate and regular rhythm.  Pulmonary:     Effort: Pulmonary effort is normal. No respiratory distress.     Breath sounds: Normal breath sounds. No stridor. No wheezing, rhonchi or rales.     Comments: Harsh, barking cough noted Chest:     Chest wall: No tenderness.  Musculoskeletal:     Cervical back: Normal range of motion and neck supple. No rigidity or tenderness.  Lymphadenopathy:     Cervical: No cervical adenopathy.  Skin:    General: Skin is warm and dry.     Coloration: Skin is not jaundiced.  Findings: No bruising, erythema or rash.  Neurological:     General: No focal deficit present.     Mental Status: She is alert and oriented to person, place, and time.     Motor: No weakness.     Gait: Gait normal.      Results for orders placed or performed in visit on 05/27/23  POCT rapid strep A  Result Value Ref Range   Rapid Strep A Screen Negative Negative  POCT Influenza A/B  Result Value Ref Range   Influenza A, POC Negative Negative   Influenza B, POC Negative Negative    Last CBC Lab Results  Component Value Date   WBC 5.3 08/23/2022   HGB 14.3 08/23/2022   HCT 43.6 08/23/2022   MCV 90.1 08/23/2022   MCH 30.3 08/17/2021   RDW 13.4 08/23/2022   PLT 273.0 08/23/2022   Last metabolic panel Lab Results  Component Value Date   GLUCOSE 98 02/24/2023   NA 140 02/24/2023   K 4.2 02/24/2023   CL 105 02/24/2023   CO2 27 02/24/2023   BUN 26 (H) 02/24/2023   CREATININE 0.73 02/24/2023   GFR 82.70 02/24/2023   CALCIUM 9.2 02/24/2023   PHOS 2.9 07/29/2012   PROT 6.9 08/23/2022   ALBUMIN 4.2 08/23/2022   BILITOT 0.5 08/23/2022   ALKPHOS 60 08/23/2022   AST 18 08/23/2022   ALT 14 08/23/2022   ANIONGAP 7 12/09/2014      The 10-year ASCVD risk score (Arnett DK, et al., 2019) is: 11.1%  Assessment & Plan:  Croupy cough -     POCT rapid strep A -     POCT Influenza  A/B  Acute tracheobronchitis -     Fluticasone Propionate HFA; Inhale 2 puffs into the lungs in the morning and at bedtime.  Dispense: 1 each; Refill: 0 -     Montelukast Sodium; Take 1 tablet (10 mg total) by mouth at bedtime.  Dispense: 14 tablet; Refill: 0  Seasonal allergic rhinitis due to pollen -     Fluticasone Propionate; Place 2 sprays into both nostrils daily.  Dispense: 16 g; Refill: 6  Assessment and Plan    Upper Respiratory Symptoms Persistent cough, headache, and intermittent sore throat for over a month. No fever, dyspnea, or chest pain. No abnormal findings on physical examination. Likely reactive airway disease secondary to allergen exposure. -Start inhaled steroid (Flovent) and rinse mouth after each use to prevent thrush (pt refused PO due to side effects of "angry") -Start Montelukast at bedtime. -Check-in via MyChart in 5-7 days. If no significant improvement, consider adding Azithromycin.  Seasonal Allergies History of springtime allergies. Currently taking Loratadine. -Continue Loratadine as needed.         No follow-ups on file.   Maretta Bees, PA

## 2023-06-02 ENCOUNTER — Telehealth: Payer: Self-pay

## 2023-06-02 ENCOUNTER — Other Ambulatory Visit (HOSPITAL_COMMUNITY): Payer: Self-pay

## 2023-06-02 NOTE — Telephone Encounter (Signed)
No meds needed  

## 2023-06-02 NOTE — Telephone Encounter (Signed)
 Copied from CRM [id number]. Topic: Clinical - Medical Advice >> Jun 02, 2023  9:28 AM Lennart Pall wrote: Reason for CRM: Patient was around someone that went to the hospital with RSV and Bronchitis. Patient wanting to know if there is another medication she should be taking incase she would get those symptoms

## 2023-06-02 NOTE — Telephone Encounter (Signed)
 Pt advised of provider recommendations.

## 2023-06-11 ENCOUNTER — Ambulatory Visit: Admitting: Family Medicine

## 2023-07-15 ENCOUNTER — Telehealth: Payer: Self-pay

## 2023-07-15 NOTE — Telephone Encounter (Signed)
 Copied from CRM [id number]. Topic: Clinical - Medical Advice >> Jul 15, 2023 12:30 PM Jethro Morrison wrote: Reason for CRM: PT CALLED WANTED DR [facility] AND HIS NURSE TO KNOW SHE HAD A PASSOUT SPELL ON YESTERDAY, [facility] HAD TO COME, SHE STATED THEY CHECKED HER AND SHE WAS FINE, BUT THEY WANTED HER TO FOLLOW UP WITH YOU. I DID FIND A SPOT FOR FRIDAY FOR HER AND I CANCELLED THE APPT FOR NEXT WEEK. THANKS

## 2023-07-17 NOTE — Patient Instructions (Signed)

## 2023-07-18 ENCOUNTER — Ambulatory Visit (INDEPENDENT_AMBULATORY_CARE_PROVIDER_SITE_OTHER): Admitting: Family Medicine

## 2023-07-18 ENCOUNTER — Encounter: Payer: Self-pay | Admitting: Family Medicine

## 2023-07-18 VITALS — BP 137/82 | HR 60 | Temp 97.9°F | Wt 141.0 lb

## 2023-07-18 DIAGNOSIS — Z5181 Encounter for therapeutic drug level monitoring: Secondary | ICD-10-CM | POA: Diagnosis not present

## 2023-07-18 DIAGNOSIS — G9331 Postviral fatigue syndrome: Secondary | ICD-10-CM | POA: Diagnosis not present

## 2023-07-18 DIAGNOSIS — M150 Primary generalized (osteo)arthritis: Secondary | ICD-10-CM

## 2023-07-18 DIAGNOSIS — N390 Urinary tract infection, site not specified: Secondary | ICD-10-CM

## 2023-07-18 DIAGNOSIS — Z791 Long term (current) use of non-steroidal anti-inflammatories (NSAID): Secondary | ICD-10-CM

## 2023-07-18 DIAGNOSIS — E538 Deficiency of other specified B group vitamins: Secondary | ICD-10-CM | POA: Diagnosis not present

## 2023-07-18 DIAGNOSIS — R55 Syncope and collapse: Secondary | ICD-10-CM | POA: Diagnosis not present

## 2023-07-18 LAB — BASIC METABOLIC PANEL WITH GFR
BUN: 13 mg/dL (ref 6–23)
CO2: 29 meq/L (ref 19–32)
Calcium: 9.2 mg/dL (ref 8.4–10.5)
Chloride: 106 meq/L (ref 96–112)
Creatinine, Ser: 0.69 mg/dL (ref 0.40–1.20)
GFR: 87.04 mL/min (ref >60.00)
Glucose, Bld: 94 mg/dL (ref 70–99)
Potassium: 4.9 meq/L (ref 3.5–5.1)
Sodium: 143 meq/L (ref 135–145)

## 2023-07-18 LAB — CBC WITH DIFFERENTIAL/PLATELET
Basophils Absolute: 0.1 10*3/uL (ref 0.0–0.1)
Basophils Relative: 1.1 % (ref 0.0–3.0)
Eosinophils Absolute: 0.2 10*3/uL (ref 0.0–0.7)
Eosinophils Relative: 2.6 % (ref 0.0–5.0)
HCT: 41.1 % (ref 36.0–46.0)
Hemoglobin: 13.5 g/dL (ref 12.0–15.0)
Lymphocytes Relative: 31.8 % (ref 12.0–46.0)
Lymphs Abs: 2.2 10*3/uL (ref 0.7–4.0)
MCHC: 32.9 g/dL (ref 30.0–36.0)
MCV: 91.4 fl (ref 78.0–100.0)
Monocytes Absolute: 0.7 10*3/uL (ref 0.1–1.0)
Monocytes Relative: 9.8 % (ref 3.0–12.0)
Neutro Abs: 3.7 10*3/uL (ref 1.4–7.7)
Neutrophils Relative %: 54.7 % (ref 43.0–77.0)
Platelets: 335 10*3/uL (ref 150.0–400.0)
RBC: 4.5 Mil/uL (ref 3.87–5.11)
RDW: 13.9 % (ref 11.5–15.5)
WBC: 6.8 10*3/uL (ref 4.0–10.5)

## 2023-07-18 LAB — VITAMIN B12: Vitamin B-12: 460 pg/mL (ref 211–911)

## 2023-07-18 LAB — TSH: TSH: 2.87 u[IU]/mL (ref 0.35–5.50)

## 2023-07-18 NOTE — Progress Notes (Signed)
 OFFICE VISIT  07/18/2023  CC:  Chief Complaint  Patient presents with   Medical Management of Chronic Issues    Patient is a 73 y.o. female who presents for 67-month follow-up osteoarthritis and recurrent UTI. At last routine follow-up visit A/P was: "1 osteoarthritis, multiple sites. On daily NSAID--meloxicam  15 mg a day. Basic metabolic panel today.   2.  Recurrent UTI with significant component of overactive bladder. No signs of UTI at this time.  Overactive bladder symptoms improved with Ditropan  XL 5 mg a day."  INTERIM HX: She describes ongoing fatigue since having back-to-back viral URIs in January then again in March this year. She has some intermittent headache in the right eyebrow area, a little bit of nasal congestion at night. No fever or cough or sore throat.  5 days ago she was standing in her driveway leaning up against her car talking to someone and felt some nausea and a little abdominal pain and then not long after she felt like she was going to pass out and eventually did pass out briefly.  EMS evaluated her, blood pressure apparently was normal, glucose normal.  She felt okay afterward other than feeling drained a little bit cognitively foggy. She had no palpitations, chest pain, shortness of breath, diaphoresis, or vision abnormalities.  No focal weakness.  Review of systems--> as above, plus: Some intermittent/occasional period of feeling an urgent need to urinate but nothing comes. No dysuria.  She takes her oxybutynin  every night. No lower extremity swelling.  She takes meloxicam  every day for osteoarthritis pain in various sites.  She takes a vitamin B12 tablet most days.  Past Medical History:  Diagnosis Date   Allergy    Borderline hyperlipidemia    Framingham CV risk 2017= 6%.   Cataract    Cerebral palsy (HCC)    Colon polyp, hyperplastic 2004; 12/2015   Recall 12/2025   Difficulty sleeping    Endometrial adenocarcinoma (HCC) 09/2013   Dr.  P[place]: pt got vaginal brachytherapy via rad onc.  No sign of dz recurrence as of 02/2016 Gyn Onc f/u.  Next f/u with them is 6 mo.   Morton's neuroma 2012   Dr. Barnet Lias did injections in the past (left foot)   Osteoarthritis    LB, HIPs, knees (L knee end stage), hands   Osteopenia    DEXA 10/2017 was -1.5.  10/2019 T score -1.9.  11/2021 T score -1.7   Patellofemoral arthralgia of left knee 12/24/2013   Radiation 12/16/13, 12/22/13, 12/30/13, 01/06/14, 01/13/14   proximal vagina 30 gray   Right hip pain 2021   more radicular, like DDD etiology->[facility], [facility]   Rotator cuff tendonitis 04/21/2013   Seasonal allergic rhinitis     Past Surgical History:  Procedure Laterality Date   COLONOSCOPY  09/2002; 12/2015   2004 hyperplastic; recall sent 2009 but pt apparently didn't respond.  12/2015: one sessile serrated polyp w/out cytologic atypia: recall 10 yrs per GI.   DEXA  08/2007; 11/14/15; 10/2017;10/2019   2009 T score -1.2.  2017 T score -1.6.  2019 T score -1.5. 10/2019 0-1.9.  11/2021 T score -1.7.   DILATION AND CURETTAGE OF UTERUS     LEG SURGERY Left 01/2014   "cleaned out cartilage"   ROBOTIC ASSISTED TOTAL HYSTERECTOMY WITH BILATERAL SALPINGO OOPHERECTOMY Bilateral 10/05/2013   Procedure: ROBOTIC ASSISTED TOTAL HYSTERECTOMY WITH BILATERAL SALPINGO OOPHORECTOMY WITH LYMPH NODE DISECTION;  Surgeon: Alphonso Aschoff, MD;  Location: [facility];  Service: Gynecology;  Laterality: Bilateral;  TONSILLECTOMY     TOTAL KNEE ARTHROPLASTY Left 12/07/2014   Procedure: TOTAL KNEE ARTHROPLASTY;  Surgeon: Hazle Lites, MD;  Location: [facility];  Service: Orthopedics;  Laterality: Left;   TUBAL LIGATION      Outpatient Medications Prior to Visit  Medication Sig Dispense Refill   beclomethasone (QVAR  REDIHALER) 40 MCG/ACT inhaler Inhale 1 puff into the lungs 2 (two) times daily. 1 each 0   beta carotene w/minerals (OCUVITE) tablet Take 1 tablet by mouth every morning. Reported on 06/01/2015     CALCIUM PO Take 1  tablet by mouth daily.     cetirizine (ZYRTEC) 10 MG tablet Take 10 mg by mouth daily as needed for allergies. Reported on 06/01/2015     Cholecalciferol (VITAMIN D PO) Take 1 tablet by mouth daily.     CRANBERRY PO Take 4,200 mg by mouth. Take 2 capsules daily.     cyclobenzaprine  (FLEXERIL ) 10 MG tablet 1 tab po bid prn for musculoskeletal pain 30 tablet 5   diclofenac  Sodium (VOLTAREN ) 1 % GEL Apply 2 g topically 4 (four) times daily. 100 g 3   docusate sodium (COLACE) 100 MG capsule Take 100 mg by mouth daily as needed for mild constipation. Reported on 06/01/2015     fluconazole  (DIFLUCAN ) 150 MG tablet 1 tab p.o. daily x 1 dose 1 tablet 0   fluticasone  (FLONASE ) 50 MCG/ACT nasal spray Place 2 sprays into both nostrils daily. 16 g 6   meloxicam  (MOBIC ) 15 MG tablet Take 1 tablet (15 mg total) by mouth daily. 90 tablet 1   montelukast  (SINGULAIR ) 10 MG tablet Take 1 tablet (10 mg total) by mouth at bedtime. 14 tablet 0   oxybutynin  (DITROPAN -XL) 5 MG 24 hr tablet Take 1 tablet (5 mg total) by mouth at bedtime. 30 tablet 5   pantoprazole  (PROTONIX ) 40 MG tablet Take 1 tablet (40 mg total) by mouth daily. 90 tablet 1   polyethylene glycol (MIRALAX  / GLYCOLAX ) packet Take 17 g by mouth daily as needed for mild constipation. Reported on 06/01/2015     vitamin B-12 (CYANOCOBALAMIN) 1000 MCG tablet Take 1,000 mcg by mouth daily.     Probiotic Product (PROBIOTIC PO) Take 1 tablet by mouth every morning. Reported on 06/01/2015     No facility-administered medications prior to visit.    Allergies  Allergen Reactions   Aspirin Nausea Only    High dose only   Seasonal Ic [Cholestatin]     Review of Systems As per HPI  PE:    07/18/2023    9:05 AM 05/27/2023   11:29 AM 04/09/2023    3:36 PM  Vitals with BMI  Weight 141 lbs 139 lbs 13 oz 140 lbs  Systolic 137 132 865  Diastolic 82 84 85  Pulse 60 85 75     Physical Exam  Gen: Alert, well appearing.  Patient is oriented to person, place,  time, and situation. AFFECT: pleasant, lucid thought and speech. ENT: Ears: EACs clear, normal epithelium.  TMs with good light reflex and landmarks bilaterally.  Eyes: no injection, icteris, swelling, or exudate.  EOMI, PERRLA. Nose: no drainage or turbinate edema/swelling.  No injection or focal lesion.  Mouth: lips without lesion/swelling.  Oral mucosa pink and moist.  Dentition intact and without obvious caries or gingival swelling.  Oropharynx without erythema, exudate, or swelling.  Neck - No masses or thyromegaly or limitation in range of motion CV: RRR, no m/r/g.   LUNGS: CTA bilat, nonlabored resps, good aeration  in all lung fields. EXT: no clubbing or cyanosis.  no edema.  Neuro: CN 2-12 intact bilaterally, strength 5/5 in proximal and distal upper extremities and lower extremities bilaterally.  No tremor.  No disdiadochokinesis.  She has unsteady gait, as per her usual. Upper extremity and lower extremity DTRs symmetric.  No pronator drift.   LABS:  Last CBC Lab Results  Component Value Date   WBC 5.3 08/23/2022   HGB 14.3 08/23/2022   HCT 43.6 08/23/2022   MCV 90.1 08/23/2022   MCH 30.3 08/17/2021   RDW 13.4 08/23/2022   PLT 273.0 08/23/2022   Last metabolic panel Lab Results  Component Value Date   GLUCOSE 98 02/24/2023   NA 140 02/24/2023   K 4.2 02/24/2023   CL 105 02/24/2023   CO2 27 02/24/2023   BUN 26 (H) 02/24/2023   CREATININE 0.73 02/24/2023   GFR 82.70 02/24/2023   CALCIUM 9.2 02/24/2023   PHOS 2.9 07/29/2012   PROT 6.9 08/23/2022   ALBUMIN 4.2 08/23/2022   BILITOT 0.5 08/23/2022   ALKPHOS 60 08/23/2022   AST 18 08/23/2022   ALT 14 08/23/2022   ANIONGAP 7 12/09/2014   Last lipids Lab Results  Component Value Date   CHOL 184 08/23/2022   HDL 48.30 08/23/2022   LDLCALC 118 (H) 08/23/2022   TRIG 86.0 08/23/2022   CHOLHDL 4 08/23/2022   Last hemoglobin A1c Lab Results  Component Value Date   HGBA1C 5.8 08/09/2019   Last thyroid   functions Lab Results  Component Value Date   TSH 2.66 08/15/2021   IMPRESSION AND PLAN:  #1 postviral fatigue. Reassured. Check TSH, CBC, vitamin B12, and basic metabolic panel.  #2 vasovagal syncope. Some dehydration may have contributed.  She had a bit of preceding abdominal pain and nausea but this did not persist.  An After Visit Summary was printed and given to the patient.  FOLLOW UP: Return in about 6 weeks (around 08/29/2023) for f/u postviral fatigue. Cpe 6 wks Signed:  Arletha Lady, MD           07/18/2023

## 2023-07-21 ENCOUNTER — Encounter: Payer: Self-pay | Admitting: Family Medicine

## 2023-07-23 ENCOUNTER — Ambulatory Visit: Admitting: Family Medicine

## 2023-08-29 ENCOUNTER — Encounter: Payer: Self-pay | Admitting: Family Medicine

## 2023-08-29 ENCOUNTER — Ambulatory Visit (INDEPENDENT_AMBULATORY_CARE_PROVIDER_SITE_OTHER): Admitting: Family Medicine

## 2023-08-29 VITALS — BP 129/82 | HR 63 | Temp 98.7°F | Ht 59.0 in | Wt 136.6 lb

## 2023-08-29 DIAGNOSIS — G9331 Postviral fatigue syndrome: Secondary | ICD-10-CM | POA: Diagnosis not present

## 2023-08-29 DIAGNOSIS — N766 Ulceration of vulva: Secondary | ICD-10-CM

## 2023-08-29 NOTE — Progress Notes (Signed)
 OFFICE VISIT  08/29/2023  CC:  Chief Complaint  Patient presents with   Fatigue    6 week f/u; completed 1 dose of Diflucan , would like renewed or cream. She keeps getting sores in vaginal area    Patient is a 73 y.o. female who presents for 6 weeks follow-up fatigue and eval genital concern. A/P as of last visit: "#1 postviral fatigue. Reassured. Check TSH, CBC, vitamin B12, and basic metabolic panel.   #2 vasovagal syncope. Some dehydration may have contributed.  She had a bit of preceding abdominal pain and nausea but this did not persist."  INTERIM HX: All lab results were normal last visit.  She feels better from an energy standpoint.  She is eating and drinking well.  She has a recurrent pinkish/reddish bump in the right genital region. It does not hurt.  It comes and goes.  She does have chronic moisture in the area due to urinary incontinence.  She wears a pad regularly. No vaginal discharge. She does use oxybutynin  5 mg daily and it does help her incontinence.   Past Medical History:  Diagnosis Date   Allergy    Borderline hyperlipidemia    Framingham CV risk 2017= 6%.   Cataract    Cerebral palsy (HCC)    Colon polyp, hyperplastic 2004; 12/2015   Recall 12/2025   Difficulty sleeping    Endometrial adenocarcinoma (HCC) 09/2013   Dr. Pearly Bound: pt got vaginal brachytherapy via rad onc.  No sign of dz recurrence as of 02/2016 Gyn Onc f/u.  Next f/u with them is 6 mo.   Morton's neuroma 2012   Dr. Barnet Lias did injections in the past (left foot)   Osteoarthritis    LB, HIPs, knees (L knee end stage), hands   Osteopenia    DEXA 10/2017 was -1.5.  10/2019 T score -1.9.  11/2021 T score -1.7   Patellofemoral arthralgia of left knee 12/24/2013   Radiation 12/16/13, 12/22/13, 12/30/13, 01/06/14, 01/13/14   proximal vagina 30 gray   Right hip pain 2021   more radicular, like DDD etiology->PT, [facility]   Rotator cuff tendonitis 04/21/2013   Seasonal allergic rhinitis      Past Surgical History:  Procedure Laterality Date   COLONOSCOPY  09/2002; 12/2015   2004 hyperplastic; recall sent 2009 but pt apparently didn't respond.  12/2015: one sessile serrated polyp w/out cytologic atypia: recall 10 yrs per GI.   DEXA  08/2007; 11/14/15; 10/2017;10/2019   2009 T score -1.2.  2017 T score -1.6.  2019 T score -1.5. 10/2019 0-1.9.  11/2021 T score -1.7.   DILATION AND CURETTAGE OF UTERUS     LEG SURGERY Left 01/2014   "cleaned out cartilage"   ROBOTIC ASSISTED TOTAL HYSTERECTOMY WITH BILATERAL SALPINGO OOPHERECTOMY Bilateral 10/05/2013   Procedure: ROBOTIC ASSISTED TOTAL HYSTERECTOMY WITH BILATERAL SALPINGO OOPHORECTOMY WITH LYMPH NODE DISECTION;  Surgeon: Alphonso Aschoff, MD;  Location: [facility];  Service: Gynecology;  Laterality: Bilateral;   TONSILLECTOMY     TOTAL KNEE ARTHROPLASTY Left 12/07/2014   Procedure: TOTAL KNEE ARTHROPLASTY;  Surgeon: Hazle Lites, MD;  Location: [facility];  Service: Orthopedics;  Laterality: Left;   TUBAL LIGATION      Outpatient Medications Prior to Visit  Medication Sig Dispense Refill   beclomethasone (QVAR  REDIHALER) 40 MCG/ACT inhaler Inhale 1 puff into the lungs 2 (two) times daily. 1 each 0   beta carotene w/minerals (OCUVITE) tablet Take 1 tablet by mouth every morning. Reported on 06/01/2015     CALCIUM  PO Take 1 tablet by mouth daily.     cetirizine (ZYRTEC) 10 MG tablet Take 10 mg by mouth daily as needed for allergies. Reported on 06/01/2015     Cholecalciferol (VITAMIN D PO) Take 1 tablet by mouth daily.     CRANBERRY PO Take 4,200 mg by mouth. Take 2 capsules daily.     cyclobenzaprine  (FLEXERIL ) 10 MG tablet 1 tab po bid prn for musculoskeletal pain 30 tablet 5   diclofenac  Sodium (VOLTAREN ) 1 % GEL Apply 2 g topically 4 (four) times daily. 100 g 3   docusate sodium (COLACE) 100 MG capsule Take 100 mg by mouth daily as needed for mild constipation. Reported on 06/01/2015     fluticasone  (FLONASE ) 50 MCG/ACT nasal spray Place 2 sprays  into both nostrils daily. 16 g 6   meloxicam  (MOBIC ) 15 MG tablet Take 1 tablet (15 mg total) by mouth daily. 90 tablet 1   montelukast  (SINGULAIR ) 10 MG tablet Take 1 tablet (10 mg total) by mouth at bedtime. 14 tablet 0   oxybutynin  (DITROPAN -XL) 5 MG 24 hr tablet Take 1 tablet (5 mg total) by mouth at bedtime. 30 tablet 5   pantoprazole  (PROTONIX ) 40 MG tablet Take 1 tablet (40 mg total) by mouth daily. 90 tablet 1   polyethylene glycol (MIRALAX  / GLYCOLAX ) packet Take 17 g by mouth daily as needed for mild constipation. Reported on 06/01/2015     vitamin B-12 (CYANOCOBALAMIN ) 1000 MCG tablet Take 1,000 mcg by mouth daily.     fluconazole  (DIFLUCAN ) 150 MG tablet 1 tab p.o. daily x 1 dose (Patient not taking: Reported on 08/29/2023) 1 tablet 0   No facility-administered medications prior to visit.    Allergies  Allergen Reactions   Aspirin Nausea Only    High dose only   Seasonal Ic [Cholestatin]     Review of Systems As per HPI  PE:    08/29/2023    8:57 AM 07/18/2023    9:05 AM 05/27/2023   11:29 AM  Vitals with BMI  Height 4\' 11"     Weight 136 lbs 10 oz 141 lbs 139 lbs 13 oz  BMI 27.57    Systolic 129 137 161  Diastolic 82 82 84  Pulse 63 60 85     Physical Exam Exam chaperoned by Terris Fickle, CMA.  Gen: Alert, well appearing.  Patient is oriented to person, place, time, and situation. AFFECT: pleasant, lucid thought and speech. GU exam: Just inferolateral to the right labia majora there is a 5 mm irregular superficial ulceration.  This is pinkish colored.  LABS:  Last CBC Lab Results  Component Value Date   WBC 6.8 07/18/2023   HGB 13.5 07/18/2023   HCT 41.1 07/18/2023   MCV 91.4 07/18/2023   MCH 30.3 08/17/2021   RDW 13.9 07/18/2023   PLT 335.0 07/18/2023   Last metabolic panel Lab Results  Component Value Date   GLUCOSE 94 07/18/2023   NA 143 07/18/2023   K 4.9 07/18/2023   CL 106 07/18/2023   CO2 29 07/18/2023   BUN 13 07/18/2023   CREATININE  0.69 07/18/2023   GFR 87.04 07/18/2023   CALCIUM 9.2 07/18/2023   PHOS 2.9 07/29/2012   PROT 6.9 08/23/2022   ALBUMIN 4.2 08/23/2022   BILITOT 0.5 08/23/2022   ALKPHOS 60 08/23/2022   AST 18 08/23/2022   ALT 14 08/23/2022   ANIONGAP 7 12/09/2014   Last thyroid  functions Lab Results  Component Value Date   TSH  2.87 07/18/2023   Last vitamin B12 and Folate Lab Results  Component Value Date   VITAMINB12 460 07/18/2023   Lab Results  Component Value Date   HGBA1C 5.8 08/09/2019   IMPRESSION AND PLAN:  #1 postviral fatigue.  This is resolving appropriately.  2.  Genital ulceration. Likely due to chronic irritation/moisture due to incontinence. Lower suspicion of recurrent genital herpes lesion. I recommended application of A&D ointment to the area regularly.  An After Visit Summary was printed and given to the patient.  FOLLOW UP: Return for keep 09/09/23 appt already set.  Signed:  Arletha Lady, MD           08/29/2023

## 2023-09-09 ENCOUNTER — Ambulatory Visit (INDEPENDENT_AMBULATORY_CARE_PROVIDER_SITE_OTHER): Payer: Medicare Other | Admitting: Family Medicine

## 2023-09-09 ENCOUNTER — Encounter: Payer: Self-pay | Admitting: Family Medicine

## 2023-09-09 VITALS — BP 146/74 | HR 56 | Temp 97.4°F | Ht 59.75 in | Wt 138.0 lb

## 2023-09-09 DIAGNOSIS — M150 Primary generalized (osteo)arthritis: Secondary | ICD-10-CM

## 2023-09-09 DIAGNOSIS — E7800 Pure hypercholesterolemia, unspecified: Secondary | ICD-10-CM

## 2023-09-09 DIAGNOSIS — Z791 Long term (current) use of non-steroidal anti-inflammatories (NSAID): Secondary | ICD-10-CM | POA: Diagnosis not present

## 2023-09-09 DIAGNOSIS — E2839 Other primary ovarian failure: Secondary | ICD-10-CM

## 2023-09-09 DIAGNOSIS — Z0000 Encounter for general adult medical examination without abnormal findings: Secondary | ICD-10-CM | POA: Diagnosis not present

## 2023-09-09 DIAGNOSIS — M8580 Other specified disorders of bone density and structure, unspecified site: Secondary | ICD-10-CM

## 2023-09-09 LAB — COMPREHENSIVE METABOLIC PANEL WITH GFR
ALT: 11 U/L (ref 0–35)
AST: 16 U/L (ref 0–37)
Albumin: 4 g/dL (ref 3.5–5.2)
Alkaline Phosphatase: 56 U/L (ref 39–117)
BUN: 19 mg/dL (ref 6–23)
CO2: 29 meq/L (ref 19–32)
Calcium: 9.1 mg/dL (ref 8.4–10.5)
Chloride: 106 meq/L (ref 96–112)
Creatinine, Ser: 0.72 mg/dL (ref 0.40–1.20)
GFR: 83.77 mL/min (ref >60.00)
Glucose, Bld: 90 mg/dL (ref 70–99)
Potassium: 4.3 meq/L (ref 3.5–5.1)
Sodium: 140 meq/L (ref 135–145)
Total Bilirubin: 0.6 mg/dL (ref 0.2–1.2)
Total Protein: 6.9 g/dL (ref 6.0–8.3)

## 2023-09-09 LAB — LIPID PANEL
Cholesterol: 177 mg/dL (ref 0–200)
HDL: 51.9 mg/dL (ref >39.00)
LDL Cholesterol: 114 mg/dL — ABNORMAL HIGH (ref 0–99)
NonHDL: 124.99
Total CHOL/HDL Ratio: 3
Triglycerides: 57 mg/dL (ref 0.0–149.0)
VLDL: 11.4 mg/dL (ref 0.0–40.0)

## 2023-09-09 MED ORDER — PANTOPRAZOLE SODIUM 40 MG PO TBEC: 40.0000 mg | DELAYED_RELEASE_TABLET | Freq: Every day | ORAL | 3 refills | Status: AC

## 2023-09-09 MED ORDER — MELOXICAM 15 MG PO TABS: 15.0000 mg | ORAL_TABLET | Freq: Every day | ORAL | 3 refills | Status: AC

## 2023-09-09 NOTE — Progress Notes (Signed)
 Office Note 09/09/2023  CC:  Chief Complaint  Patient presents with   Annual Exam    Pt is fasting   Patient is a 73 y.o. female who is here for annual health maintenance exam and follow-up osteoarthritis with regular NSAID use.  She takes meloxicam  15 mg every day for chronic low back, hips, knees, hands, and feet pain. This helps well. She does take pantoprazole  40 mg but only on a as needed basis.  She says this helps well with her upset stomach/heartburn symptoms.  She does not monitor her blood pressure at home.  She did say she rushed up here for her appointment this morning.  Past Medical History:  Diagnosis Date   Allergy    Borderline hyperlipidemia    Framingham CV risk 2017= 6%.   Cataract    Cerebral palsy (HCC)    Colon polyp, hyperplastic 2004; 12/2015   Recall 12/2025   Difficulty sleeping    Endometrial adenocarcinoma (HCC) 09/2013   Dr. Pearly Bound: pt got vaginal brachytherapy via rad onc.  No sign of dz recurrence as of 02/2016 Gyn Onc f/u.  Next f/u with them is 6 mo.   Morton's neuroma 2012   Dr. Barnet Lias did injections in the past (left foot)   Osteoarthritis    LB, HIPs, knees (L knee end stage), hands   Osteopenia    DEXA 10/2017 was -1.5.  10/2019 T score -1.9.  11/2021 T score -1.7   Patellofemoral arthralgia of left knee 12/24/2013   Radiation 12/16/13, 12/22/13, 12/30/13, 01/06/14, 01/13/14   proximal vagina 30 gray   Right hip pain 2021   more radicular, like DDD etiology->[facility]   Rotator cuff tendonitis 04/21/2013   Seasonal allergic rhinitis     Past Surgical History:  Procedure Laterality Date   COLONOSCOPY  09/2002; 12/2015   2004 hyperplastic; recall sent 2009 but pt apparently didn't respond.  12/2015: one sessile serrated polyp w/out cytologic atypia: recall 10 yrs per GI.   DEXA  08/2007; 11/14/15; 10/2017;10/2019   2009 T score -1.2.  2017 T score -1.6.  2019 T score -1.5. 10/2019 0-1.9.  11/2021 T score -1.7.   DILATION AND CURETTAGE  OF UTERUS     LEG SURGERY Left 01/2014   cleaned out cartilage   ROBOTIC ASSISTED TOTAL HYSTERECTOMY WITH BILATERAL SALPINGO OOPHERECTOMY Bilateral 10/05/2013   Procedure: ROBOTIC ASSISTED TOTAL HYSTERECTOMY WITH BILATERAL SALPINGO OOPHORECTOMY WITH LYMPH NODE DISECTION;  Surgeon: Alphonso Aschoff, MD;  Location: [facility];  Service: Gynecology;  Laterality: Bilateral;   TONSILLECTOMY     TOTAL KNEE ARTHROPLASTY Left 12/07/2014   Procedure: TOTAL KNEE ARTHROPLASTY;  Surgeon: Hazle Lites, MD;  Location: [facility];  Service: Orthopedics;  Laterality: Left;   TUBAL LIGATION      Family History  Problem Relation Age of Onset   Heart disease Father    Hypertension Father    Cancer Father        lung   Arthritis Father    Alcohol abuse Father    Heart disease Mother    Cancer Mother        lung   Deep vein thrombosis Mother    Alcohol abuse Mother    Heart disease Brother    Hypertension Brother    Heart disease Brother    Hypertension Brother    Heart disease Brother    Hypertension Brother    Stomach cancer Maternal Grandfather    Colon cancer Neg Hx    Esophageal cancer Neg  Hx    Rectal cancer Neg Hx     Social History   Socioeconomic History   Marital status: Divorced    Spouse name: Not on file   Number of children: 2   Years of education: Not on file   Highest education level: 12th grade  Occupational History   Occupation: retired  Tobacco Use   Smoking status: Never   Smokeless tobacco: Never  Vaping Use   Vaping status: Never Used  Substance and Sexual Activity   Alcohol use: No    Comment: occasional   Drug use: No   Sexual activity: Not on file  Other Topics Concern   Not on file  Social History Narrative   Divorced.  Two children.   Orig from [place], [place].   HS education.   Retired.   No tobacco, occ alcohol, no drugs.   Has 3 brothers and they all smoked, as did her parents.   Social Drivers of Corporate investment banker Strain: Low Risk   (11/13/2022)   Overall Financial Resource Strain (CARDIA)    Difficulty of Paying Living Expenses: Not hard at all  Food Insecurity: No Food Insecurity (11/13/2022)   Hunger Vital Sign    Worried About Running Out of Food in the Last Year: Never true    Ran Out of Food in the Last Year: Never true  Transportation Needs: No Transportation Needs (11/13/2022)   PRAPARE - Administrator, Civil Service (Medical): No    Lack of Transportation (Non-Medical): No  Physical Activity: Insufficiently Active (11/13/2022)   Exercise Vital Sign    Days of Exercise per Week: 2 days    Minutes of Exercise per Session: 30 min  Stress: No Stress Concern Present (11/13/2022)   [employer] of Occupational Health - Occupational Stress Questionnaire    Feeling of Stress : Not at all  Social Connections: Moderately Integrated (11/13/2022)   Social Connection and Isolation Panel    Frequency of Communication with Friends and Family: More than three times a week    Frequency of Social Gatherings with Friends and Family: More than three times a week    Attends Religious Services: More than 4 times per year    Active Member of [employer] or Organizations: Yes    Attends Banker Meetings: 1 to 4 times per year    Marital Status: Divorced  Intimate Partner Violence: Not At Risk (11/13/2022)   Humiliation, Afraid, Rape, and Kick questionnaire    Fear of Current or Ex-Partner: No    Emotionally Abused: No    Physically Abused: No    Sexually Abused: No    Outpatient Medications Prior to Visit  Medication Sig Dispense Refill   beclomethasone (QVAR  REDIHALER) 40 MCG/ACT inhaler Inhale 1 puff into the lungs 2 (two) times daily. 1 each 0   beta carotene w/minerals (OCUVITE) tablet Take 1 tablet by mouth every morning. Reported on 06/01/2015     CALCIUM PO Take 1 tablet by mouth daily.     cetirizine (ZYRTEC) 10 MG tablet Take 10 mg by mouth daily as needed for allergies. Reported on 06/01/2015      Cholecalciferol (VITAMIN D PO) Take 1 tablet by mouth daily.     CRANBERRY PO Take 4,200 mg by mouth. Take 2 capsules daily.     cyclobenzaprine  (FLEXERIL ) 10 MG tablet 1 tab po bid prn for musculoskeletal pain 30 tablet 5   diclofenac  Sodium (VOLTAREN ) 1 % GEL Apply 2 g  topically 4 (four) times daily. 100 g 3   docusate sodium (COLACE) 100 MG capsule Take 100 mg by mouth daily as needed for mild constipation. Reported on 06/01/2015     fluticasone  (FLONASE ) 50 MCG/ACT nasal spray Place 2 sprays into both nostrils daily. 16 g 6   montelukast  (SINGULAIR ) 10 MG tablet Take 1 tablet (10 mg total) by mouth at bedtime. 14 tablet 0   oxybutynin  (DITROPAN -XL) 5 MG 24 hr tablet Take 1 tablet (5 mg total) by mouth at bedtime. 30 tablet 5   polyethylene glycol (MIRALAX  / GLYCOLAX ) packet Take 17 g by mouth daily as needed for mild constipation. Reported on 06/01/2015     vitamin B-12 (CYANOCOBALAMIN ) 1000 MCG tablet Take 1,000 mcg by mouth daily.     fluconazole  (DIFLUCAN ) 150 MG tablet 1 tab p.o. daily x 1 dose (Patient not taking: Reported on 09/09/2023) 1 tablet 0   meloxicam  (MOBIC ) 15 MG tablet Take 1 tablet (15 mg total) by mouth daily. 90 tablet 1   pantoprazole  (PROTONIX ) 40 MG tablet Take 1 tablet (40 mg total) by mouth daily. 90 tablet 1   No facility-administered medications prior to visit.    Allergies  Allergen Reactions   Aspirin Nausea Only    High dose only   Seasonal Ic [Cholestatin]     Review of Systems  Constitutional:  Negative for appetite change, chills, fatigue and fever.  HENT:  Negative for congestion, dental problem, ear pain and sore throat.   Eyes:  Negative for discharge, redness and visual disturbance.  Respiratory:  Negative for cough, chest tightness, shortness of breath and wheezing.   Cardiovascular:  Negative for chest pain, palpitations and leg swelling.  Gastrointestinal:  Negative for abdominal pain, blood in stool, diarrhea, nausea and vomiting.  Genitourinary:   Negative for difficulty urinating, dysuria, flank pain, frequency, hematuria and urgency.  Musculoskeletal:  Negative for arthralgias, back pain, joint swelling, myalgias and neck stiffness.  Skin:  Negative for pallor and rash.  Neurological:  Negative for dizziness, speech difficulty, weakness and headaches.  Hematological:  Negative for adenopathy. Does not bruise/bleed easily.  Psychiatric/Behavioral:  Negative for confusion and sleep disturbance. The patient is not nervous/anxious.     PE;    09/09/2023    9:16 AM 09/09/2023    9:09 AM 08/29/2023    8:57 AM  Vitals with BMI  Height  4' 11.75 4' 11  Weight  138 lbs 136 lbs 10 oz  BMI  27.16 27.57  Systolic 146 160 161  Diastolic 74 80 82  Pulse  56 63   Exam chaperoned by Terris Fickle, CMA. Gen: Alert, well appearing.  Patient is oriented to person, place, time, and situation. AFFECT: pleasant, lucid thought and speech. ENT: Ears: EACs clear, normal epithelium.  TMs with good light reflex and landmarks bilaterally.  Eyes: no injection, icteris, swelling, or exudate.  EOMI, PERRLA. Nose: no drainage or turbinate edema/swelling.  No injection or focal lesion.  Mouth: lips without lesion/swelling.  Oral mucosa pink and moist.  Dentition intact and without obvious caries or gingival swelling.  Oropharynx without erythema, exudate, or swelling.  Neck: supple/nontender.  No LAD, mass, or TM.  Carotid pulses 2+ bilaterally, without bruits. CV: RRR, no m/r/g.   LUNGS: CTA bilat, nonlabored resps, good aeration in all lung fields. ABD: soft, NT, ND, BS normal.  No hepatospenomegaly or mass.  No bruits. EXT: no clubbing, cyanosis, or edema.  Musculoskeletal: no joint swelling, erythema, warmth, or tenderness.  ROM of all joints intact. Skin - no sores or suspicious lesions or rashes or color changes  Pertinent labs:  Lab Results  Component Value Date   TSH 2.87 07/18/2023   Lab Results  Component Value Date   WBC 6.8 07/18/2023    HGB 13.5 07/18/2023   HCT 41.1 07/18/2023   MCV 91.4 07/18/2023   PLT 335.0 07/18/2023   Lab Results  Component Value Date   CREATININE 0.69 07/18/2023   BUN 13 07/18/2023   NA 143 07/18/2023   K 4.9 07/18/2023   CL 106 07/18/2023   CO2 29 07/18/2023   Lab Results  Component Value Date   ALT 14 08/23/2022   AST 18 08/23/2022   ALKPHOS 60 08/23/2022   BILITOT 0.5 08/23/2022   Lab Results  Component Value Date   CHOL 184 08/23/2022   Lab Results  Component Value Date   HDL 48.30 08/23/2022   Lab Results  Component Value Date   LDLCALC 118 (H) 08/23/2022   Lab Results  Component Value Date   TRIG 86.0 08/23/2022   Lab Results  Component Value Date   CHOLHDL 4 08/23/2022   Lab Results  Component Value Date   HGBA1C 5.8 08/09/2019   ASSESSMENT AND PLAN:   1 health maintenance exam: Reviewed age and gender appropriate health maintenance issues (prudent diet, regular exercise, health risks of tobacco and excessive alcohol, use of seatbelts, fire alarms in home, use of sunscreen).  Also reviewed age and gender appropriate health screening as well as vaccine recommendations. Vaccines: ALL UTD. Labs: cmet, cbc, flp. Cervical ca screening: hx of endometrial ca, pt s/p TAH/BSO 5 yrs ago.  F/u with GYN MD. Breast ca screening: next mammo due 11/2023. Osteoporosis screening: hx of osteopenia.  Plan repeat DEXA 11/2023. She takes ca and vit D supp daily. Colon ca screening: recall 2027.  2.  Osteoarthritis multiple sites. She is on meloxicam  every day.  We are monitoring renal function closely. She has no GI symptoms from her NSAID.  #3 hypercholesterolemia, mild. Low cardiovascular risk--> statins have not been indicated for her. Lipid panel and hepatic panel today.  An After Visit Summary was printed and given to the patient.  FOLLOW UP:  Return in about 6 months (around 03/10/2024) for routine chronic illness f/u.  Signed:  Arletha Lady, MD            09/09/2023

## 2023-09-09 NOTE — Patient Instructions (Signed)

## 2023-09-10 ENCOUNTER — Ambulatory Visit: Payer: Self-pay | Admitting: Family Medicine

## 2023-09-15 DIAGNOSIS — H2513 Age-related nuclear cataract, bilateral: Secondary | ICD-10-CM | POA: Diagnosis not present

## 2023-09-15 DIAGNOSIS — H47021 Hemorrhage in optic nerve sheath, right eye: Secondary | ICD-10-CM | POA: Diagnosis not present

## 2023-09-15 DIAGNOSIS — H353131 Nonexudative age-related macular degeneration, bilateral, early dry stage: Secondary | ICD-10-CM | POA: Diagnosis not present

## 2023-09-19 ENCOUNTER — Telehealth (HOSPITAL_BASED_OUTPATIENT_CLINIC_OR_DEPARTMENT_OTHER): Payer: Self-pay

## 2023-10-28 ENCOUNTER — Telehealth: Payer: Self-pay

## 2023-10-28 NOTE — Telephone Encounter (Signed)
 Reason for CRM: Patient stated that she has Jury Duty on 12/01/2023. Patient is needing a documents stating that she has trouble with her walking (can't go up stairs) etc so she won't have to go to [employer]. She is needing this document 10 days before she has to go.  Called patient regarding jury duty. She will upload jury duty summons in [place], will forward on to Dr. McGowen once rec'd.

## 2023-10-29 ENCOUNTER — Encounter: Payer: Self-pay | Admitting: Family Medicine

## 2023-10-29 NOTE — Telephone Encounter (Signed)
Placed in PCP office for review/signature

## 2023-10-30 ENCOUNTER — Other Ambulatory Visit: Payer: Self-pay | Admitting: Urgent Care

## 2023-10-30 DIAGNOSIS — R350 Frequency of micturition: Secondary | ICD-10-CM

## 2023-10-30 NOTE — Telephone Encounter (Signed)
 Placed up front for pt/pt son-in-law pick up.

## 2023-12-17 ENCOUNTER — Other Ambulatory Visit: Payer: Self-pay | Admitting: Family Medicine

## 2023-12-17 DIAGNOSIS — H43813 Vitreous degeneration, bilateral: Secondary | ICD-10-CM | POA: Diagnosis not present

## 2023-12-17 DIAGNOSIS — R350 Frequency of micturition: Secondary | ICD-10-CM

## 2023-12-17 DIAGNOSIS — H47021 Hemorrhage in optic nerve sheath, right eye: Secondary | ICD-10-CM | POA: Diagnosis not present

## 2023-12-17 NOTE — Telephone Encounter (Unsigned)
 Copied from CRM [id number]. Topic: Clinical - Medication Refill >> Dec 17, 2023  4:31 PM Thersia C wrote: Medication: oxybutynin  (DITROPAN -XL) 5 MG 24 hr tablet Patient wanted to know if she could get 90 day supply   Has the patient contacted their pharmacy? Yes (Agent: If no, request that the patient contact the pharmacy for the refill. If patient does not wish to contact the pharmacy document the reason why and proceed with request.) (Agent: If yes, when and what did the pharmacy advise?)  This is the patient's preferred pharmacy:  [facility] [place], [place] - [address] [place] [address] Phone: [phone] Fax: [phone]  Is this the correct pharmacy for this prescription? Yes If no, delete pharmacy and type the correct one.   Has the prescription been filled recently? No  Is the patient out of the medication? Yes  Has the patient been seen for an appointment in the last year OR does the patient have an upcoming appointment? Yes  Can we respond through MyChart? Yes  Agent: Please be advised that Rx refills may take up to 3 business days. We ask that you follow-up with your pharmacy.

## 2023-12-18 DIAGNOSIS — Z1231 Encounter for screening mammogram for malignant neoplasm of breast: Secondary | ICD-10-CM | POA: Diagnosis not present

## 2023-12-18 LAB — HM MAMMOGRAPHY

## 2023-12-18 MED ORDER — OXYBUTYNIN CHLORIDE ER 5 MG PO TB24: 5.0000 mg | ORAL_TABLET | Freq: Every day | ORAL | 2 refills | Status: DC

## 2024-01-08 ENCOUNTER — Telehealth: Payer: Self-pay

## 2024-01-08 NOTE — Telephone Encounter (Signed)
 Copied from CRM #[id number]. Topic: Clinical - Lab/Test Results >> Jan 08, 2024 11:23 AM Charolett L wrote: Reason for CRM: Patient is calling and wanting a call back from the nurse to go over Mammogram results

## 2024-01-09 NOTE — Telephone Encounter (Signed)
 LM for pt to return call regarding mammogram results.

## 2024-01-28 ENCOUNTER — Ambulatory Visit (INDEPENDENT_AMBULATORY_CARE_PROVIDER_SITE_OTHER)

## 2024-01-28 VITALS — Ht 59.75 in | Wt 138.0 lb

## 2024-01-28 DIAGNOSIS — Z0000 Encounter for general adult medical examination without abnormal findings: Secondary | ICD-10-CM | POA: Diagnosis not present

## 2024-01-28 NOTE — Patient Instructions (Addendum)
 Kelsey Bruce,  Thank you for taking the time for your Medicare Wellness Visit. I appreciate your continued commitment to your health goals. Please review the care plan we discussed, and feel free to reach out if I can assist you further.  Please note that Annual Wellness Visits do not include a physical exam. Some assessments may be limited, especially if the visit was conducted virtually. If needed, we may recommend an in-person follow-up with your provider.  Ongoing Care Seeing your primary care provider every 3 to 6 months helps us  monitor your health and provide consistent, personalized care. Next office visit on 03/10/2024.  You are due for a flu vaccine and can get that done at your local pharmacy.  Referrals If a referral was made during today's visit and you haven't received any updates within two weeks, please contact the referred provider directly to check on the status.  Recommended Screenings:  Health Maintenance  Topic Date Due   Flu Shot  10/24/2023   COVID-19 Vaccine (4 - 2025-26 season) 11/24/2023   DTaP/Tdap/Td vaccine (2 - Td or Tdap) 09/29/2024   Breast Cancer Screening  12/17/2024   Medicare Annual Wellness Visit  01/27/2025   Colon Cancer Screening  01/03/2026   Pneumococcal Vaccine for age over 31  Completed   DEXA scan (bone density measurement)  Completed   Hepatitis C Screening  Completed   Zoster (Shingles) Vaccine  Completed   Meningitis B Vaccine  Aged Out       01/28/2024    2:16 PM  Advanced Directives  Does Patient Have a Medical Advance Directive? No  Would patient like information on creating a medical advance directive? No - Patient declined    Vision: Annual vision screenings are recommended for early detection of glaucoma, cataracts, and diabetic retinopathy. These exams can also reveal signs of chronic conditions such as diabetes and high blood pressure.  Dental: Annual dental screenings help detect early signs of oral cancer, gum disease, and  other conditions linked to overall health, including heart disease and diabetes.  Please see the attached documents for additional preventive care recommendations.

## 2024-01-28 NOTE — Progress Notes (Signed)
 Subjective:   Kelsey Bruce is a 73 y.o. female who presents for a Medicare Annual Wellness Visit.   Visit Complete: Virtual I connected with this patient by a audio enabled telemedicine application and verified that I am speaking with the correct person using two identifiers.  Patient Location: Home Provider Location: Office/Clinic or Home Office  I discussed the limitations of evaluation and management by telemedicine. The patient expressed understanding and agreed to proceed.  Persons Participating in Visit: Patient  Allergies (verified) Aspirin and Seasonal ic [cholestatin]   History: Past Medical History:  Diagnosis Date   Allergy    Borderline hyperlipidemia    Framingham CV risk 2017= 6%.   Cataract    Cerebral palsy (HCC)    Colon polyp, hyperplastic 2004; 12/2015   Recall 12/2025   Difficulty sleeping    Endometrial adenocarcinoma (HCC) 09/2013   Dr. Eloy: pt got vaginal brachytherapy via rad onc.  No sign of dz recurrence as of 02/2016 Gyn Onc f/u.  Next f/u with them is 6 mo.   Morton's neuroma 2012   Dr. Ana did injections in the past (left foot)   Osteoarthritis    LB, HIPs, knees (L knee end stage), hands   Osteopenia    DEXA 10/2017 was -1.5.  10/2019 T score -1.9.  11/2021 T score -1.7   Patellofemoral arthralgia of left knee 12/24/2013   Radiation 12/16/13, 12/22/13, 12/30/13, 01/06/14, 01/13/14   proximal vagina 30 gray   Right hip pain 2021   more radicular, like DDD etiology->PT, [facility]   Rotator cuff tendonitis 04/21/2013   Seasonal allergic rhinitis    Past Surgical History:  Procedure Laterality Date   COLONOSCOPY  09/2002; 12/2015   2004 hyperplastic; recall sent 2009 but pt apparently didn't respond.  12/2015: one sessile serrated polyp w/out cytologic atypia: recall 10 yrs per GI.   DEXA  08/2007; 11/14/15; 10/2017;10/2019   2009 T score -1.2.  2017 T score -1.6.  2019 T score -1.5. 10/2019 0-1.9.  11/2021 T score -1.7.   DILATION  AND CURETTAGE OF UTERUS     LEG SURGERY Left 01/2014   cleaned out cartilage   ROBOTIC ASSISTED TOTAL HYSTERECTOMY WITH BILATERAL SALPINGO OOPHERECTOMY Bilateral 10/05/2013   Procedure: ROBOTIC ASSISTED TOTAL HYSTERECTOMY WITH BILATERAL SALPINGO OOPHORECTOMY WITH LYMPH NODE DISECTION;  Surgeon: Maurilio Eloy, MD;  Location: [facility];  Service: Gynecology;  Laterality: Bilateral;   TONSILLECTOMY     TOTAL KNEE ARTHROPLASTY Left 12/07/2014   Procedure: TOTAL KNEE ARTHROPLASTY;  Surgeon: Tanda Heading, MD;  Location: [facility];  Service: Orthopedics;  Laterality: Left;   TUBAL LIGATION     Family History  Problem Relation Age of Onset   Heart disease Father    Hypertension Father    Cancer Father        lung   Arthritis Father    Alcohol abuse Father    Heart disease Mother    Cancer Mother        lung   Deep vein thrombosis Mother    Alcohol abuse Mother    Heart disease Brother    Hypertension Brother    Heart disease Brother    Hypertension Brother    Heart disease Brother    Hypertension Brother    Stomach cancer Maternal Grandfather    Colon cancer Neg Hx    Esophageal cancer Neg Hx    Rectal cancer Neg Hx    Social History   Occupational History   Occupation: retired  Tobacco Use   Smoking status: Never   Smokeless tobacco: Never  Vaping Use   Vaping status: Never Used  Substance and Sexual Activity   Alcohol use: No    Comment: occasional   Drug use: No   Sexual activity: Not on file   Tobacco Counseling Counseling given: Not Answered  SDOH Screenings   Food Insecurity: No Food Insecurity (01/28/2024)  Housing: Unknown (01/28/2024)  Transportation Needs: No Transportation Needs (01/28/2024)  Utilities: Not At Risk (01/28/2024)  Alcohol Screen: Low Risk  (02/08/2021)  Depression (PHQ2-9): Low Risk  (01/28/2024)  Financial Resource Strain: Low Risk  (11/13/2022)  Physical Activity: Insufficiently Active (01/28/2024)  Social Connections: Moderately Integrated  (01/28/2024)  Stress: No Stress Concern Present (01/28/2024)  Tobacco Use: Low Risk  (01/28/2024)  Health Literacy: Adequate Health Literacy (01/28/2024)   Depression Screen    01/28/2024    2:23 PM 09/09/2023    9:12 AM 02/24/2023    8:45 AM 11/13/2022   10:31 AM 08/23/2022    9:47 AM 08/20/2022    3:29 PM 02/12/2022   10:05 AM  PHQ 2/9 Scores  PHQ - 2 Score 0 0 1 0 1 1 0  PHQ- 9 Score 1  6  4 7       Goals Addressed             This Visit's Progress    Patient Stated       Walk better  for balance  Still working on this goal/2025       Visit info / Clinical Intake: Medicare Wellness Visit Type:: Subsequent Annual Wellness Visit Medicare Wellness Visit Mode:: Telephone If telephone:: video declined If telephone or video:: vitals recorded from last visit Interpreter Needed?: No Pre-visit prep was completed: no AWV questionnaire completed by patient prior to visit?: no Living arrangements:: (!) lives alone Patient's Overall Health Status Rating: good Typical amount of pain: none Does pain affect daily life?: no Are you currently prescribed opioids?: no  Dietary Habits and Nutritional Risks How many meals a day?: 2 Eats fruit and vegetables daily?: yes Most meals are obtained by: preparing own meals; eating out; having others provide food Diabetic:: no  Functional Status Activities of Daily Living (to include ambulation/medication): Independent (uses a waling stick) Ambulation: Independent with device- listed below (uses a waling stick) Home Assistive Devices/Equipment: Eyeglasses Medication Administration: Independent Home Management: Independent Manage your own finances?: yes Primary transportation is: driving Concerns about vision?: no *vision screening is required for [facility]* Concerns about hearing?: no  Fall Screening Falls in the past year?: 1 Number of falls in past year: 0 Was there an injury with Fall?: 0 Fall Risk Category Calculator: 1 Patient Fall Risk  Level: Low Fall Risk  Fall Risk Patient at Risk for Falls Due to: No Fall Risks; Impaired balance/gait Fall risk Follow up: Falls evaluation completed; Falls prevention discussed  Home and Transportation Safety: All rugs have non-skid backing?: N/A, no rugs All stairs or steps have railings?: yes (outside in front) Grab bars in the bathtub or shower?: yes Have non-skid surface in bathtub or shower?: yes Good home lighting?: yes Regular seat belt use?: yes Hospital stays in the last year:: no  Cognitive Assessment Difficulty concentrating, remembering, or making decisions? : no Will 6CIT or Mini Cog be Completed: no 6CIT or Mini Cog Declined: patient alert, oriented, able to answer questions appropriately and recall recent events  Advance Directives (For Healthcare) Does Patient Have a Medical Advance Directive?: No Would patient like information  on creating a medical advance directive?: No - Patient declined  Reviewed/Updated  Reviewed/Updated: All        Objective:    Today's Vitals   01/28/24 1412  Weight: 138 lb (62.6 kg)  Height: 4' 11.75 (1.518 m)   Body mass index is 27.18 kg/m.  Current Medications (verified) Outpatient Encounter Medications as of 01/28/2024  Medication Sig   beclomethasone (QVAR  REDIHALER) 40 MCG/ACT inhaler Inhale 1 puff into the lungs 2 (two) times daily.   beta carotene w/minerals (OCUVITE) tablet Take 1 tablet by mouth every morning. Reported on 06/01/2015   CALCIUM PO Take 1 tablet by mouth daily.   cetirizine (ZYRTEC) 10 MG tablet Take 10 mg by mouth daily as needed for allergies. Reported on 06/01/2015   Cholecalciferol (VITAMIN D PO) Take 1 tablet by mouth daily.   CRANBERRY PO Take 4,200 mg by mouth. Take 2 capsules daily.   cyclobenzaprine  (FLEXERIL ) 10 MG tablet 1 tab po bid prn for musculoskeletal pain   diclofenac  Sodium (VOLTAREN ) 1 % GEL Apply 2 g topically 4 (four) times daily.   docusate sodium (COLACE) 100 MG capsule Take 100  mg by mouth daily as needed for mild constipation. Reported on 06/01/2015   fluticasone  (FLONASE ) 50 MCG/ACT nasal spray Place 2 sprays into both nostrils daily.   meloxicam  (MOBIC ) 15 MG tablet Take 1 tablet (15 mg total) by mouth daily.   montelukast  (SINGULAIR ) 10 MG tablet Take 1 tablet (10 mg total) by mouth at bedtime.   oxybutynin  (DITROPAN -XL) 5 MG 24 hr tablet Take 1 tablet (5 mg total) by mouth at bedtime.   pantoprazole  (PROTONIX ) 40 MG tablet Take 1 tablet (40 mg total) by mouth daily.   polyethylene glycol (MIRALAX  / GLYCOLAX ) packet Take 17 g by mouth daily as needed for mild constipation. Reported on 06/01/2015   vitamin B-12 (CYANOCOBALAMIN ) 1000 MCG tablet Take 1,000 mcg by mouth daily.   No facility-administered encounter medications on file as of 01/28/2024.   Hearing/Vision screen Hearing Screening - Comments:: Denies hearing difficulties   Vision Screening - Comments:: Wears eyeglasses/Dr. Arthur pt UTD Immunizations and Health Maintenance Health Maintenance  Topic Date Due   Influenza Vaccine  10/24/2023   Medicare Annual Wellness (AWV)  11/13/2023   COVID-19 Vaccine (4 - 2025-26 season) 11/24/2023   DTaP/Tdap/Td (2 - Td or Tdap) 09/29/2024   Mammogram  12/17/2024   Colonoscopy  01/03/2026   Pneumococcal Vaccine: 50+ Years  Completed   DEXA SCAN  Completed   Hepatitis C Screening  Completed   Zoster Vaccines- Shingrix  Completed   Meningococcal B Vaccine  Aged Out        Assessment/Plan:  This is a routine wellness examination for Kelsey Bruce.  Patient Care Team: Candise Aleene DEL, MD as PCP - General (Family Medicine) Abran Norleen SAILOR, MD as Consulting Physician (Gastroenterology) Starla Harland BROCKS, MD as Consulting Physician (Obstetrics and Gynecology) Eloy Herring, MD as Consulting Physician (Obstetrics and Gynecology) Heide Ingle, MD as Consulting Physician (Orthopedic Surgery) Volanda Charlie BROCKS, DPM (Inactive) as Consulting Physician (Podiatry)  I have  personally reviewed and noted the following in the patient's chart:   Medical and social history Use of alcohol, tobacco or illicit drugs  Current medications and supplements including opioid prescriptions. Functional ability and status Nutritional status Physical activity Advanced directives List of other physicians Hospitalizations, surgeries, and ER visits in previous 12 months Vitals Screenings to include cognitive, depression, and falls Referrals and appointments  No orders of the defined types  were placed in this encounter.  In addition, I have reviewed and discussed with patient certain preventive protocols, quality metrics, and best practice recommendations. A written personalized care plan for preventive services as well as general preventive health recommendations were provided to patient.   Cortnie Ringel L Heela Heishman, CMA   01/28/2024   No follow-ups on file.  After Visit Summary: (MyChart) Due to this being a telephonic visit, the after visit summary with patients personalized plan was offered to patient via MyChart   Nurse Notes: Patient is due for flu vaccine.Patient is up to date on all other health maintenance with no concerns to address today.

## 2024-02-05 ENCOUNTER — Encounter: Payer: Self-pay | Admitting: Family Medicine

## 2024-02-05 ENCOUNTER — Ambulatory Visit (INDEPENDENT_AMBULATORY_CARE_PROVIDER_SITE_OTHER): Admitting: Family Medicine

## 2024-02-05 VITALS — BP 128/77 | HR 69 | Temp 98.0°F | Ht 59.75 in | Wt 137.0 lb

## 2024-02-05 DIAGNOSIS — R231 Pallor: Secondary | ICD-10-CM

## 2024-02-05 DIAGNOSIS — Z23 Encounter for immunization: Secondary | ICD-10-CM

## 2024-02-05 DIAGNOSIS — B351 Tinea unguium: Secondary | ICD-10-CM

## 2024-02-05 DIAGNOSIS — R350 Frequency of micturition: Secondary | ICD-10-CM

## 2024-02-05 MED ORDER — OXYBUTYNIN CHLORIDE ER 5 MG PO TB24: 5.0000 mg | ORAL_TABLET | Freq: Every day | ORAL | 3 refills | Status: AC

## 2024-02-05 NOTE — Progress Notes (Signed)
 OFFICE VISIT  02/05/2024  CC:  Chief Complaint  Patient presents with   Follow-up    Circulation in feet; pt has not noticed any difference but home health nurse noticed improvement.    Patient is a 73 y.o. female who presents for question of poor circulation in feet.  INTERIM HX: Feet are little bit cool at the toes, sometimes they turn pale color.  No redness, no swelling.  She has a thick and discolored big toenail lately on the left. It is a bit loose.  She denies any pain or abnormal temperature sensation in her feet.  Past Medical History:  Diagnosis Date   Allergy    Borderline hyperlipidemia    Framingham CV risk 2017= 6%.   Cataract    Cerebral palsy (HCC)    Colon polyp, hyperplastic 2004; 12/2015   Recall 12/2025   Difficulty sleeping    Endometrial adenocarcinoma (HCC) 09/2013   Dr. Eloy: pt got vaginal brachytherapy via rad onc.  No sign of dz recurrence as of 02/2016 Gyn Onc f/u.  Next f/u with them is 6 mo.   Morton's neuroma 2012   Dr. Ana did injections in the past (left foot)   Osteoarthritis    LB, HIPs, knees (L knee end stage), hands   Osteopenia    DEXA 10/2017 was -1.5.  10/2019 T score -1.9.  11/2021 T score -1.7   Patellofemoral arthralgia of left knee 12/24/2013   Radiation 12/16/13, 12/22/13, 12/30/13, 01/06/14, 01/13/14   proximal vagina 30 gray   Right hip pain 2021   more radicular, like DDD etiology->[facility], [facility]   Rotator cuff tendonitis 04/21/2013   Seasonal allergic rhinitis     Past Surgical History:  Procedure Laterality Date   COLONOSCOPY  09/2002; 12/2015   2004 hyperplastic; recall sent 2009 but pt apparently didn't respond.  12/2015: one sessile serrated polyp w/out cytologic atypia: recall 10 yrs per GI.   DEXA  08/2007; 11/14/15; 10/2017;10/2019   2009 T score -1.2.  2017 T score -1.6.  2019 T score -1.5. 10/2019 0-1.9.  11/2021 T score -1.7.   DILATION AND CURETTAGE OF UTERUS     LEG SURGERY Left 01/2014   cleaned out  cartilage   ROBOTIC ASSISTED TOTAL HYSTERECTOMY WITH BILATERAL SALPINGO OOPHERECTOMY Bilateral 10/05/2013   Procedure: ROBOTIC ASSISTED TOTAL HYSTERECTOMY WITH BILATERAL SALPINGO OOPHORECTOMY WITH LYMPH NODE DISECTION;  Surgeon: Maurilio Eloy, MD;  Location: [facility];  Service: Gynecology;  Laterality: Bilateral;   TONSILLECTOMY     TOTAL KNEE ARTHROPLASTY Left 12/07/2014   Procedure: TOTAL KNEE ARTHROPLASTY;  Surgeon: Tanda Heading, MD;  Location: [facility];  Service: Orthopedics;  Laterality: Left;   TUBAL LIGATION      Outpatient Medications Prior to Visit  Medication Sig Dispense Refill   beclomethasone (QVAR  REDIHALER) 40 MCG/ACT inhaler Inhale 1 puff into the lungs 2 (two) times daily. 1 each 0   beta carotene w/minerals (OCUVITE) tablet Take 1 tablet by mouth every morning. Reported on 06/01/2015     CALCIUM PO Take 1 tablet by mouth daily.     cetirizine (ZYRTEC) 10 MG tablet Take 10 mg by mouth daily as needed for allergies. Reported on 06/01/2015     Cholecalciferol (VITAMIN D PO) Take 1 tablet by mouth daily.     CRANBERRY PO Take 4,200 mg by mouth. Take 2 capsules daily.     cyclobenzaprine  (FLEXERIL ) 10 MG tablet 1 tab po bid prn for musculoskeletal pain 30 tablet 5   diclofenac  Sodium (  VOLTAREN ) 1 % GEL Apply 2 g topically 4 (four) times daily. 100 g 3   docusate sodium (COLACE) 100 MG capsule Take 100 mg by mouth daily as needed for mild constipation. Reported on 06/01/2015     fluticasone  (FLONASE ) 50 MCG/ACT nasal spray Place 2 sprays into both nostrils daily. 16 g 6   meloxicam  (MOBIC ) 15 MG tablet Take 1 tablet (15 mg total) by mouth daily. 90 tablet 3   montelukast  (SINGULAIR ) 10 MG tablet Take 1 tablet (10 mg total) by mouth at bedtime. 14 tablet 0   oxybutynin  (DITROPAN -XL) 5 MG 24 hr tablet Take 1 tablet (5 mg total) by mouth at bedtime. 30 tablet 2   pantoprazole  (PROTONIX ) 40 MG tablet Take 1 tablet (40 mg total) by mouth daily. 90 tablet 3   polyethylene glycol (MIRALAX  /  GLYCOLAX ) packet Take 17 g by mouth daily as needed for mild constipation. Reported on 06/01/2015     vitamin B-12 (CYANOCOBALAMIN ) 1000 MCG tablet Take 1,000 mcg by mouth daily.     No facility-administered medications prior to visit.    Allergies  Allergen Reactions   Aspirin Nausea Only    High dose only   Seasonal Ic [Cholestatin]     Review of Systems As per HPI  PE:    02/05/2024   11:07 AM 01/28/2024    2:12 PM 09/09/2023    9:16 AM  Vitals with BMI  Height 4' 11.75 4' 11.75   Weight 137 lbs 138 lbs   BMI 26.97 27.16   Systolic 128  146  Diastolic 77  74  Pulse 69       Physical Exam  Gen: Alert, well appearing.  Patient is oriented to person, place, time, and situation. AFFECT: pleasant, lucid thought and speech. Legs: No edema. Feet slightly cool to touch and mottled appearance with pressure--> distal one third bilateral. Dorsalis pedis and posterior tibial pulses 2+ bilaterally.  She has slightly thickened and discolored big toenail on the left foot with partial nail plate separation on the lateral aspect.  No erythema, no tenderness.   LABS:  Last CBC Lab Results  Component Value Date   WBC 6.8 07/18/2023   HGB 13.5 07/18/2023   HCT 41.1 07/18/2023   MCV 91.4 07/18/2023   MCH 30.3 08/17/2021   RDW 13.9 07/18/2023   PLT 335.0 07/18/2023   Last metabolic panel Lab Results  Component Value Date   GLUCOSE 90 09/09/2023   NA 140 09/09/2023   K 4.3 09/09/2023   CL 106 09/09/2023   CO2 29 09/09/2023   BUN 19 09/09/2023   CREATININE 0.72 09/09/2023   GFR 83.77 09/09/2023   CALCIUM 9.1 09/09/2023   PHOS 2.9 07/29/2012   PROT 6.9 09/09/2023   ALBUMIN 4.0 09/09/2023   BILITOT 0.6 09/09/2023   ALKPHOS 56 09/09/2023   AST 16 09/09/2023   ALT 11 09/09/2023   ANIONGAP 7 12/09/2014   Last lipids Lab Results  Component Value Date   CHOL 177 09/09/2023   HDL 51.90 09/09/2023   LDLCALC 114 (H) 09/09/2023   TRIG 57.0 09/09/2023   CHOLHDL 3  09/09/2023   Last hemoglobin A1c Lab Results  Component Value Date   HGBA1C 5.8 08/09/2019   IMPRESSION AND PLAN:  Cool feet, without any operative abnormality. Reassured. No suspicion of clinically significant peripheral vascular disease or neuropathy. She has an onychomycotic left big toenail that is slightly separated but she is asymptomatic and is okay to just watch.  An  After Visit Summary was printed and given to the patient.  FOLLOW UP: No follow-ups on file.  Signed:  Gerlene Hockey, MD           02/05/2024

## 2024-03-10 ENCOUNTER — Ambulatory Visit: Admitting: Family Medicine

## 2024-03-10 ENCOUNTER — Encounter: Payer: Self-pay | Admitting: Family Medicine

## 2024-03-10 VITALS — BP 143/84 | HR 57 | Temp 97.8°F | Ht 59.75 in | Wt 137.4 lb

## 2024-03-10 DIAGNOSIS — N3941 Urge incontinence: Secondary | ICD-10-CM | POA: Diagnosis not present

## 2024-03-10 DIAGNOSIS — Z791 Long term (current) use of non-steroidal anti-inflammatories (NSAID): Secondary | ICD-10-CM | POA: Diagnosis not present

## 2024-03-10 DIAGNOSIS — R350 Frequency of micturition: Secondary | ICD-10-CM

## 2024-03-10 DIAGNOSIS — R030 Elevated blood-pressure reading, without diagnosis of hypertension: Secondary | ICD-10-CM | POA: Diagnosis not present

## 2024-03-10 DIAGNOSIS — R8290 Unspecified abnormal findings in urine: Secondary | ICD-10-CM | POA: Diagnosis not present

## 2024-03-10 DIAGNOSIS — M7918 Myalgia, other site: Secondary | ICD-10-CM

## 2024-03-10 DIAGNOSIS — M150 Primary generalized (osteo)arthritis: Secondary | ICD-10-CM | POA: Diagnosis not present

## 2024-03-10 LAB — POCT URINALYSIS DIPSTICK
Bilirubin, UA: NEGATIVE
Glucose, UA: NEGATIVE
Ketones, UA: NEGATIVE
Nitrite, UA: POSITIVE
Protein, UA: NEGATIVE
Spec Grav, UA: 1.015 (ref 1.010–1.025)
Urobilinogen, UA: 0.2 U/dL
pH, UA: 6 (ref 5.0–8.0)

## 2024-03-10 LAB — BASIC METABOLIC PANEL WITH GFR
BUN: 16 mg/dL (ref 6–23)
CO2: 29 meq/L (ref 19–32)
Calcium: 9.4 mg/dL (ref 8.4–10.5)
Chloride: 103 meq/L (ref 96–112)
Creatinine, Ser: 0.69 mg/dL (ref 0.40–1.20)
GFR: 86.64 mL/min (ref >60.00)
Glucose, Bld: 88 mg/dL (ref 70–99)
Potassium: 4.5 meq/L (ref 3.5–5.1)
Sodium: 140 meq/L (ref 135–145)

## 2024-03-10 MED ORDER — CYCLOBENZAPRINE HCL 10 MG PO TABS: ORAL_TABLET | ORAL | 5 refills | Status: AC

## 2024-03-10 NOTE — Progress Notes (Signed)
 DiOFFICE VISIT  03/10/2024  CC:  Chief Complaint  Patient presents with   Medical Management of Chronic Issues    Patient is a 73 y.o. female who presents for 61-month follow-up osteoarthritis with regular NSAID use. A/P as of last visit: 1.  Osteoarthritis multiple sites. She is on meloxicam  every day.  We are monitoring renal function closely. She has no GI symptoms from her NSAID.   #2 hypercholesterolemia, mild. Low cardiovascular risk--> statins have not been indicated for her. Lipid panel and hepatic panel today.  INTERIM HX: Feeling well. Has chronic urinary frequency and some urge incont that is not changed any recently. No dysuria, blood in urine, abd pain, nausea, or flank pain. Oxybutynin  has helped her urge incont.  Aches/pains in joints persists but is well controlled with daily use of meloxicam  15mg .  She does not monitor her blood pressure at home.   Past Medical History:  Diagnosis Date   Allergy    Borderline hyperlipidemia    F[place] CV risk 2017= 6%.   Cataract    Cerebral palsy (HCC)    Colon polyp, hyperplastic 2004; 12/2015   Recall 12/2025   Difficulty sleeping    Endometrial adenocarcinoma (HCC) 09/2013   Dr. Eloy: pt got vaginal brachytherapy via rad onc.  No sign of dz recurrence as of 02/2016 [facility]yn Onc f/u.  Next f/u with them is 6 mo.   Morton's neuroma 2012   Dr. Ana did injections in the past (left foot)   Osteoarthritis    LB, HIPs, knees (L knee end stage), hands   Osteopenia    DEXA 10/2017 was -1.5.  10/2019 T score -1.9.  11/2021 T score -1.7   Patellofemoral arthralgia of left knee 12/24/2013   Radiation 12/16/13, 12/22/13, 12/30/13, 01/06/14, 01/13/14   proximal vagina 30 gray   Right hip pain 2021   more radicular, like DDD etiology->PT, [facility]   Rotator cuff tendonitis 04/21/2013   Seasonal allergic rhinitis     Past Surgical History:  Procedure Laterality Date   COLONOSCOPY  09/2002; 12/2015   2004 hyperplastic;  recall sent 2009 but pt apparently didn't respond.  12/2015: one sessile serrated polyp w/out cytologic atypia: recall 10 yrs per GI.   DEXA  08/2007; 11/14/15; 10/2017;10/2019   2009 T score -1.2.  2017 T score -1.6.  2019 T score -1.5. 10/2019 0-1.9.  11/2021 T score -1.7.   DILATION AND CURETTAGE OF UTERUS     LEG SURGERY Left 01/2014   cleaned out cartilage   ROBOTIC ASSISTED TOTAL HYSTERECTOMY WITH BILATERAL SALPINGO OOPHERECTOMY Bilateral 10/05/2013   Procedure: ROBOTIC ASSISTED TOTAL HYSTERECTOMY WITH BILATERAL SALPINGO OOPHORECTOMY WITH LYMPH NODE DISECTION;  Surgeon: Maurilio Eloy, MD;  Location: [facility];  Service: Gynecology;  Laterality: Bilateral;   TONSILLECTOMY     TOTAL KNEE ARTHROPLASTY Left 12/07/2014   Procedure: TOTAL KNEE ARTHROPLASTY;  Surgeon: Tanda Heading, MD;  Location: [facility];  Service: Orthopedics;  Laterality: Left;   TUBAL LIGATION      Outpatient Medications Prior to Visit  Medication Sig Dispense Refill   beta carotene w/minerals (OCUVITE) tablet Take 1 tablet by mouth every morning. Reported on 06/01/2015     CALCIUM PO Take 1 tablet by mouth daily.     cetirizine (ZYRTEC) 10 MG tablet Take 10 mg by mouth daily as needed for allergies. Reported on 06/01/2015     Cholecalciferol (VITAMIN D PO) Take 1 tablet by mouth daily.     CRANBERRY PO Take 4,200 mg by  mouth. Take 2 capsules daily.     diclofenac  Sodium (VOLTAREN ) 1 % GEL Apply 2 g topically 4 (four) times daily. 100 g 3   docusate sodium (COLACE) 100 MG capsule Take 100 mg by mouth daily as needed for mild constipation. Reported on 06/01/2015     fluticasone  (FLONASE ) 50 MCG/ACT nasal spray Place 2 sprays into both nostrils daily. 16 g 6   meloxicam  (MOBIC ) 15 MG tablet Take 1 tablet (15 mg total) by mouth daily. 90 tablet 3   oxybutynin  (DITROPAN -XL) 5 MG 24 hr tablet Take 1 tablet (5 mg total) by mouth at bedtime. 90 tablet 3   pantoprazole  (PROTONIX ) 40 MG tablet Take 1 tablet (40 mg total) by mouth daily. 90  tablet 3   polyethylene glycol (MIRALAX  / GLYCOLAX ) packet Take 17 g by mouth daily as needed for mild constipation. Reported on 06/01/2015     vitamin B-12 (CYANOCOBALAMIN ) 1000 MCG tablet Take 1,000 mcg by mouth daily.     cyclobenzaprine  (FLEXERIL ) 10 MG tablet 1 tab po bid prn for musculoskeletal pain 30 tablet 5   beclomethasone (QVAR  REDIHALER) 40 MCG/ACT inhaler Inhale 1 puff into the lungs 2 (two) times daily. (Patient not taking: Reported on 03/10/2024) 1 each 0   montelukast  (SINGULAIR ) 10 MG tablet Take 1 tablet (10 mg total) by mouth at bedtime. (Patient not taking: Reported on 03/10/2024) 14 tablet 0   No facility-administered medications prior to visit.    Allergies[1]  Review of Systems As per HPI  PE:    03/10/2024    9:46 AM 03/10/2024    9:39 AM 02/05/2024   11:07 AM  Vitals with BMI  Height  4' 11.75 4' 11.75  Weight  137 lbs 6 oz 137 lbs  BMI  27.05 26.97  Systolic 143 162 871  Diastolic 84 85 77  Pulse  57 69     Physical Exam  Gen: Alert, well appearing.  Patient is oriented to person, place, time, and situation. AFFECT: pleasant, lucid thought and speech. ENT: Ears: Auditory canals and tympanic membranes normal.  Eyes: no injection, icteris, swelling, or exudate.  EOMI, PERRLA. Mouth: lips without lesion/swelling.  Oral mucosa pink and moist. Oropharynx without erythema, exudate, or swelling.  CV: RRR, no m/r/g.   LUNGS: CTA bilat, nonlabored resps, good aeration in all lung fields. Extremities: No edema  LABS:  Last CBC Lab Results  Component Value Date   WBC 6.8 07/18/2023   HGB 13.5 07/18/2023   HCT 41.1 07/18/2023   MCV 91.4 07/18/2023   MCH 30.3 08/17/2021   RDW 13.9 07/18/2023   PLT 335.0 07/18/2023   Last metabolic panel Lab Results  Component Value Date   GLUCOSE 90 09/09/2023   NA 140 09/09/2023   K 4.3 09/09/2023   CL 106 09/09/2023   CO2 29 09/09/2023   BUN 19 09/09/2023   CREATININE 0.72 09/09/2023   GFR 83.77  09/09/2023   CALCIUM 9.1 09/09/2023   PHOS 2.9 07/29/2012   PROT 6.9 09/09/2023   ALBUMIN 4.0 09/09/2023   BILITOT 0.6 09/09/2023   ALKPHOS 56 09/09/2023   AST 16 09/09/2023   ALT 11 09/09/2023   ANIONGAP 7 12/09/2014   Last lipids Lab Results  Component Value Date   CHOL 177 09/09/2023   HDL 51.90 09/09/2023   LDLCALC 114 (H) 09/09/2023   TRIG 57.0 09/09/2023   CHOLHDL 3 09/09/2023   Last hemoglobin A1c Lab Results  Component Value Date   HGBA1C 5.8 08/09/2019  Last thyroid  functions Lab Results  Component Value Date   TSH 2.87 07/18/2023   IMPRESSION AND PLAN:  1.  Osteoarthritis multiple sites.  She has some intermittent myofascial pain which I think is secondary to her arthritic pain.  I refilled her cyclobenzaprine  today. She is on meloxicam  every day.  We are monitoring renal function closely. She has no GI symptoms from her NSAID. Monitor renal function today.  #2 urinary urge and stress incontinence. Symptoms unchanged from baseline.  She does get some response from her Ditropan  XL 5 mg a day. She preferred to look at a urine sample today and it does show some abnormalities--->Dipstick UA today--->trace blood, 3+ LEU, pos nitrite. Will send for urine culture but hold off on antibiotics at this time.  3. hypercholesterolemia, mild. Low cardiovascular risk--> statins have not been indicated for her. Last LDL was 114 about 6 months ago. Will repeat in 6 months.  #4 elevated blood pressure without diagnosis of hypertension. She will buy a blood pressure cuff and send me mychart message in 1-2 wks with numbers.  Home bp monitoring info sheet given today.  An After Visit Summary was printed and given to the patient.  FOLLOW UP: Return in about 6 months (around 09/08/2024) for annual CPE (fasting). CPE 6 months Signed:  Phil Clanton Emanuelson, MD           03/10/2024      [1]  Allergies Allergen Reactions   Aspirin Nausea Only    High dose only   Seasonal Ic  [Cholestatin]

## 2024-03-11 ENCOUNTER — Ambulatory Visit: Payer: Self-pay | Admitting: Family Medicine

## 2024-03-13 LAB — URINE CULTURE
MICRO NUMBER:: 17368707
SPECIMEN QUALITY:: ADEQUATE

## 2024-03-15 MED ORDER — SULFAMETHOXAZOLE-TRIMETHOPRIM 800-160 MG PO TABS: 1.0000 | ORAL_TABLET | Freq: Two times a day (BID) | ORAL | 0 refills | Status: AC

## 2024-03-23 ENCOUNTER — Ambulatory Visit (HOSPITAL_BASED_OUTPATIENT_CLINIC_OR_DEPARTMENT_OTHER)
Admission: RE | Admit: 2024-03-23 | Discharge: 2024-03-23 | Disposition: A | Discharge status: Home / Self Care | Source: Ambulatory Visit | Attending: Family Medicine | Admitting: Family Medicine

## 2024-03-23 DIAGNOSIS — E2839 Other primary ovarian failure: Secondary | ICD-10-CM | POA: Diagnosis present

## 2024-03-26 ENCOUNTER — Encounter: Payer: Self-pay | Admitting: Family Medicine
